# Patient Record
Sex: Female | Born: 1955 | Race: White | Hispanic: No | Marital: Married | Smoking: Former smoker
Health system: Southern US, Community
[De-identification: ages and names within clinical notes are randomized; demographics above are authoritative.]

## PROBLEM LIST (undated history)

## (undated) DIAGNOSIS — C801 Malignant (primary) neoplasm, unspecified: Secondary | ICD-10-CM

## (undated) DIAGNOSIS — K760 Fatty (change of) liver, not elsewhere classified: Secondary | ICD-10-CM

## (undated) DIAGNOSIS — C50912 Malignant neoplasm of unspecified site of left female breast: Secondary | ICD-10-CM

## (undated) DIAGNOSIS — M84376A Stress fracture, unspecified foot, initial encounter for fracture: Secondary | ICD-10-CM

## (undated) DIAGNOSIS — R05 Cough: Secondary | ICD-10-CM

## (undated) DIAGNOSIS — C50911 Malignant neoplasm of unspecified site of right female breast: Principal | ICD-10-CM

## (undated) DIAGNOSIS — I10 Essential (primary) hypertension: Secondary | ICD-10-CM

## (undated) DIAGNOSIS — M19079 Primary osteoarthritis, unspecified ankle and foot: Secondary | ICD-10-CM

## (undated) DIAGNOSIS — M609 Myositis, unspecified: Secondary | ICD-10-CM

## (undated) DIAGNOSIS — Z8739 Personal history of other diseases of the musculoskeletal system and connective tissue: Secondary | ICD-10-CM

## (undated) DIAGNOSIS — R059 Cough, unspecified: Secondary | ICD-10-CM

## (undated) DIAGNOSIS — Z95828 Presence of other vascular implants and grafts: Secondary | ICD-10-CM

## (undated) DIAGNOSIS — K259 Gastric ulcer, unspecified as acute or chronic, without hemorrhage or perforation: Secondary | ICD-10-CM

## (undated) DIAGNOSIS — IMO0001 Reserved for inherently not codable concepts without codable children: Secondary | ICD-10-CM

## (undated) DIAGNOSIS — C50919 Malignant neoplasm of unspecified site of unspecified female breast: Secondary | ICD-10-CM

## (undated) DIAGNOSIS — E785 Hyperlipidemia, unspecified: Secondary | ICD-10-CM

## (undated) HISTORY — DX: Stress fracture, unspecified foot, initial encounter for fracture: M84.376A

## (undated) HISTORY — DX: Primary osteoarthritis, unspecified ankle and foot: M19.079

## (undated) HISTORY — DX: Personal history of other diseases of the musculoskeletal system and connective tissue: Z87.39

## (undated) HISTORY — DX: Malignant neoplasm of unspecified site of left female breast: C50.912

## (undated) HISTORY — DX: Gastric ulcer, unspecified as acute or chronic, without hemorrhage or perforation: K25.9

## (undated) HISTORY — DX: Myositis, unspecified: M60.9

## (undated) HISTORY — DX: Fatty (change of) liver, not elsewhere classified: K76.0

## (undated) HISTORY — DX: Malignant neoplasm of unspecified site of unspecified female breast: C50.919

## (undated) HISTORY — DX: Malignant neoplasm of unspecified site of right female breast: C50.911

## (undated) HISTORY — DX: Presence of other vascular implants and grafts: Z95.828

---

## 1978-02-12 HISTORY — PX: TUBAL LIGATION: SHX77

## 2001-02-22 ENCOUNTER — Emergency Department (HOSPITAL_COMMUNITY): Admission: EM | Admit: 2001-02-22 | Discharge: 2001-02-22 | Payer: Self-pay | Admitting: Emergency Medicine

## 2001-02-23 ENCOUNTER — Encounter: Payer: Self-pay | Admitting: Emergency Medicine

## 2001-02-23 ENCOUNTER — Emergency Department (HOSPITAL_COMMUNITY): Admission: EM | Admit: 2001-02-23 | Discharge: 2001-02-23 | Payer: Self-pay | Admitting: *Deleted

## 2008-09-28 ENCOUNTER — Inpatient Hospital Stay (HOSPITAL_COMMUNITY): Admission: EM | Admit: 2008-09-28 | Discharge: 2008-09-30 | Payer: Self-pay | Admitting: Emergency Medicine

## 2008-09-28 ENCOUNTER — Ambulatory Visit: Payer: Self-pay | Admitting: Cardiology

## 2008-09-29 ENCOUNTER — Encounter (INDEPENDENT_AMBULATORY_CARE_PROVIDER_SITE_OTHER): Payer: Self-pay | Admitting: Internal Medicine

## 2010-05-20 LAB — DIFFERENTIAL
Basophils Absolute: 0 10*3/uL (ref 0.0–0.1)
Basophils Absolute: 0.1 10*3/uL (ref 0.0–0.1)
Basophils Relative: 1 % (ref 0–1)
Basophils Relative: 1 % (ref 0–1)
Eosinophils Absolute: 0.1 10*3/uL (ref 0.0–0.7)
Eosinophils Relative: 1 % (ref 0–5)
Lymphocytes Relative: 23 % (ref 12–46)
Lymphs Abs: 1.3 10*3/uL (ref 0.7–4.0)
Monocytes Absolute: 0.5 10*3/uL (ref 0.1–1.0)
Monocytes Relative: 10 % (ref 3–12)
Monocytes Relative: 8 % (ref 3–12)
Neutro Abs: 3.3 10*3/uL (ref 1.7–7.7)
Neutro Abs: 3.8 10*3/uL (ref 1.7–7.7)
Neutrophils Relative %: 58 % (ref 43–77)
Neutrophils Relative %: 66 % (ref 43–77)

## 2010-05-20 LAB — POCT CARDIAC MARKERS: Troponin i, poc: <0.05 ng/mL (ref 0.00–0.09)

## 2010-05-20 LAB — BASIC METABOLIC PANEL
BUN: 10 mg/dL (ref 6–23)
BUN: 17 mg/dL (ref 6–23)
BUN: 9 mg/dL (ref 6–23)
CO2: 24 mEq/L (ref 19–32)
CO2: 25 mEq/L (ref 19–32)
Calcium: 8.9 mg/dL (ref 8.4–10.5)
Calcium: 9 mg/dL (ref 8.4–10.5)
Calcium: 9.1 mg/dL (ref 8.4–10.5)
Chloride: 106 mEq/L (ref 96–112)
Creatinine, Ser: 0.65 mg/dL (ref 0.4–1.2)
Creatinine, Ser: 0.74 mg/dL (ref 0.4–1.2)
GFR calc Af Amer: >60 mL/min (ref >60)
GFR calc Af Amer: >60 mL/min (ref >60)
GFR calc non Af Amer: >60 mL/min (ref >60)
GFR calc non Af Amer: >60 mL/min (ref >60)
Glucose, Bld: 109 mg/dL — ABNORMAL HIGH (ref 70–99)
Glucose, Bld: 85 mg/dL (ref 70–99)
Potassium: 3.4 mEq/L — ABNORMAL LOW (ref 3.5–5.1)
Sodium: 139 mEq/L (ref 135–145)

## 2010-05-20 LAB — CARDIAC PANEL(CRET KIN+CKTOT+MB+TROPI)
CK, MB: 1.2 ng/mL (ref 0.3–4.0)
CK, MB: 1.6 ng/mL (ref 0.3–4.0)
Relative Index: INVALID (ref 0.0–2.5)
Relative Index: INVALID (ref 0.0–2.5)
Total CK: 74 U/L (ref 7–177)
Total CK: 94 U/L (ref 7–177)
Troponin I: 0.02 ng/mL (ref 0.00–0.06)

## 2010-05-20 LAB — CBC
HCT: 39.5 % (ref 36.0–46.0)
Hemoglobin: 13.9 g/dL (ref 12.0–15.0)
MCHC: 34.8 g/dL (ref 30.0–36.0)
MCHC: 35.3 g/dL (ref 30.0–36.0)
MCV: 97.6 fL (ref 78.0–100.0)
Platelets: 160 10*3/uL (ref 150–400)
Platelets: 172 10*3/uL (ref 150–400)
Platelets: 188 10*3/uL (ref 150–400)
RBC: 3.62 MIL/uL — ABNORMAL LOW (ref 3.87–5.11)
RBC: 4.05 MIL/uL (ref 3.87–5.11)
RDW: 13.2 % (ref 11.5–15.5)
RDW: 13.4 % (ref 11.5–15.5)
WBC: 5.7 10*3/uL (ref 4.0–10.5)

## 2010-05-20 LAB — LIPID PANEL
Cholesterol: 217 mg/dL — ABNORMAL HIGH (ref 0–200)
HDL: 62 mg/dL (ref >39)
LDL Cholesterol: 134 mg/dL — ABNORMAL HIGH (ref 0–99)

## 2010-05-20 LAB — RAPID URINE DRUG SCREEN, HOSP PERFORMED
Amphetamines: NOT DETECTED
Benzodiazepines: NOT DETECTED

## 2010-05-20 LAB — TSH: TSH: 2.801 u[IU]/mL (ref 0.350–4.500)

## 2010-05-20 LAB — PHOSPHORUS
Phosphorus: 3.1 mg/dL (ref 2.3–4.6)
Phosphorus: 3.1 mg/dL (ref 2.3–4.6)

## 2010-05-20 LAB — MAGNESIUM: Magnesium: 1.9 mg/dL (ref 1.5–2.5)

## 2010-06-27 NOTE — H&P (Signed)
NAMECLEONE, HULICK                  ACCOUNT NO.:  [id number]   MEDICAL RECORD NO.:  [id number]          PATIENT TYPE:  INP   LOCATION:  IC04                          FACILITY:  [facility]   PHYSICIAN:  Thad Ranger, MD       DATE OF BIRTH:  [birth year]   DATE OF ADMISSION:  09/28/2008  DATE OF DISCHARGE:  LH                              HISTORY & PHYSICAL   PRIMARY CARE PHYSICIAN:  Madelin Rear. Sherwood Gambler, MD   CHIEF COMPLAINT:  Chest pain.   HISTORY OF PRESENT ILLNESS:  Ms. Rollyson is a 55 year old female with known  history of hypertension and nicotine abuse, was brought to [facility]  Emergency Room with chief complaint of chest discomfort.  History was  obtained from the patient.  Per the patient, she has been having  intermittent episodes of chest pain, midsternal in the last 1 week.  The  chest pain did not radiate and she did not have any associated symptoms  of nausea, vomiting, diaphoresis, or any shortness of breath.  Today,  the patient went to her primary care physician's office and developed  severe chest pressure, which she described as 7/10 in intensity,  midsternal, associated with dyspnea and palpitations, but no other  radiation or any numbness in her arm or jaw.  She was given aspirin and  nitro sublingually and the chest pain resolved.  The blood pressure in  her PCP's office was noted to be 220/120 and the patient was hence  brought to [facility] ER for further evaluation.  Per the patient, she  has a known history of hypertension; however, her prescriptions ran out  3 months ago in May 2010 and since then she has not filled the  prescriptions.  Otherwise, she denies any abdominal pain, any nausea,  vomiting, diarrhea, any hematochezia, or melena.  She did have some  headache in the morning, which however has resolved at this point.  Otherwise, she denies any blurry vision, any hearing deficits, or recent  loss or gain of weight.   REVIEW OF SYSTEMS:  Ten-point review of  system is negative except  otherwise dictated in the above HPI.   PAST MEDICAL HISTORY:  Hypertension.   PAST SURGICAL HISTORY:  Tubal ligation.   SOCIAL HISTORY:  The patient was a heavy smoker and smoked 2 packs per  day for the last 20 years; however, 3 months ago, she cut down to 1/2  pack per day.  She drinks alcohol 2-3 drinks every night for last 3-5  years.  Otherwise, she denies any drug use and lives at home with her  family.   FAMILY HISTORY:  Positive for diabetes mellitus.  Her son also had an MI  at the age of 25 years.   ALLERGIES:  The patient is allergic to [facility], which gives her  palpitations.   MEDICATIONS PRIOR TO ADMISSION:  The patient has not taken any of her  blood pressure medications for last 4 months and does not remember the  name of her medications.   PHYSICAL EXAMINATION:  VITAL SIGNS:  Blood  pressure 190/103, pulse 84,  respiratory rate is 20, and O2 sat is 96%.  GENERAL:  The patient is alert, awake, and oriented x3; not in any acute  distress.  HEENT:  Anicteric sclerae, pink conjunctivae.  Pupils are reactive to  light and accommodation.  EOMI.  NECK:  Supple.  No lymphadenopathy.  No JVD.  No thyromegaly.  CVS:  S1 and S2 clear.  Regular rate and rhythm.  No murmur, rubs, or  gallops.  CHEST:  Clear to auscultation bilaterally.  No wheezing, rales, or  rhonchi.  ABDOMEN:  Nontender, nondistended.  Normal bowel sounds.  No  hepatosplenomegaly.  EXTREMITIES:  No cyanosis, clubbing, or edema noted in upper or lower  extremities.  NEURO:  Cranial nerves II-XII intact.  No focal neurological deficits  noted.  Motor strength 5/5 in upper and lower extremities.  SKIN:  No rashes or any decubitus noted.  GU:  No Foley or any CV angle tenderness noted.   LABORATORY DIAGNOSTIC DATA:  Sodium 139, potassium 3.4, BUN and  creatinine 17 and 0.7, and troponin less than 0.05.  White count 5.6,  hemoglobin 13.9, hematocrit 39.5, and platelets 188.   Chest x-ray was  done in the ER, which showed cardiomegaly, but no acute lung disease.   IMPRESSION AND PLAN:  Ms. Alderete is a 55 year old female with known  history of hypertension, was brought to [facility] Emergency Room with  complaints of chest pain associated with blood pressure of 220/120 in  her primary care physician's office.  1. Hypertensive emergency with acute chest pain.  Currently, the chest      pain has resolved.  However, blood pressure at the time of my      examination is 190/103.  The patient will be admitted to ICU and      will be started on nitro drip, which will be titrated per the      protocol.  She will be on tele monitor.  I will also obtain cardiac      enzymes for serial monitoring to rule out MI.  I will also obtain      EKG, TSH, BNP, and lipid profile.  The patient will be started on      beta-blocker and ACE inhibitor, and nitro drip will be weaned off      to the goal of her blood pressure less than 140/90.  I will also      obtain urine drug screen to rule out any exogenous drugs in her      system.  I will also place the patient on aspirin for cardio- and      renoprotective action.  Echocardiogram will be obtained to assess      the left ventricular function and to rule out any wall motion      abnormalities.  2. Nicotine and alcohol abuse.  The patient was counseled for smoking      and alcohol cessation.  3. Prophylaxis.  The patient will be placed on Lovenox for DVT      prophylaxis.  4. Code status, full code.      Thad Ranger, MD  Electronically Signed     RR/MEDQ  D:  09/28/2008  T:  09/29/2008  Job:  [id number]   cc:   Madelin Rear. Sherwood Gambler, MD  Fax: [phone]

## 2010-06-27 NOTE — Discharge Summary (Signed)
NAMEJOSCELYN, [name]                  ACCOUNT NO.:  [id number]   MEDICAL RECORD NO.:  [id number]          PATIENT TYPE:  INP   LOCATION:  A337                          FACILITY:  [facility]   PHYSICIAN:  Melissa L. Ladona Ridgel, MD  DATE OF BIRTH:  March 05, 1955   DATE OF ADMISSION:  09/29/2008  DATE OF DISCHARGE:  08/19/2010LH                               DISCHARGE SUMMARY   CHIEF COMPLAINT ON ADMISSION:  Chest pain.   DISCHARGE DIAGNOSES:  1. Hypertensive emergency with associated chest pain.  The patient was      seen in the emergency room after developing the onset of chest      pressure, 7/10 midsternal, associated was some sensation of dyspnea      and palpitations.  The symptoms were improved with nitroglycerin      but resolved with blood pressure control.  The patient has known      hypertension and had been not taking her medications for some time.      The patient was admitted to the intensive care unit and ruled out      on serial cardiac markers for acute myocardial infarction.  She had      no further chest pain or discomfort during the hospital stay.  The      patient underwent basic cholesterol screening and had an echo,      which reveals slight diastolic dysfunction grade 1, mild left      ventricular hypertrophy.  Ejection fraction is normal at 60-65%.      At this time recommendations are for continuing our current      medications, which include lisinopril, which will be up-titrated to      20 mg, metoprolol at 25 mg twice daily, and I have added Norvasc 5      mg daily as over the course of the evening her blood pressures did      try to creep up again.  The patient reported that with clonidine      she became very groggy, and therefore I did not discharge her on      clonidine at this time.  The patient will be discharged to home on      aspirin therapy, low-dose, and I have recommended a 7-day follow-up      with the free clinic and consideration for formalized cardiology   consultation once she has been seen and evaluated.  2. Mild elevations in her LDL cholesterol.  We discussed dietary      changes that she could make in order to impact upon them.  3. Tobacco abuse.  The patient has been counseled on tobacco      cessation, and she has the availability to enroll in classes as an      outpatient.  4. The patient does use daily alcohol.  She has been counseled with      regard to this.  At this time she shows no signs or symptoms of      alcohol withdrawal.   MEDICATIONS AT THE TIME OF DISCHARGE:  1. Lisinopril  20 mg daily.  2. Metoprolol 25 mg twice daily.  3. Norvasc 5 mg daily.  4. Aspirin 81 mg daily.   HOSPITAL COURSE:  The patient is a pleasant 55 year old female with a  known past medical history for hypertension, who presented to the  emergency room from an outside primary care physician's office.  While  at the primary care physician's office, she developed chest pressure  described as central, nonradiating, but associated with mild dyspnea and  palpitations.  The patient's blood pressure was noted to be 220/110 in  the doctor's office and therefore was brought to the emergency room for  further evaluation.  The patient states that she ran out of her  medications approximately 3 months ago and had not filled any  prescriptions since that time.  The patient was admitted to the ICU for  treatment with IV nitroglycerin for blood pressure control.  She  tolerated this well.  By the morning of the next day of admission, her  blood pressures were systolically in the 120s.  She had no chest pain  and no headache.  She was weaned from the nitroglycerin drip and  maintained her blood pressures on the oral medications that were  prescribed.  On the day of discharge the patient's blood pressures were  noted to creep up slightly.  She therefore was given 1 dose of  clonidine.  The patient reported to me that she became excessively  sleepy after taking  the clonidine, which did not feel good to her.  Her  follow-up blood pressures after receiving the clonidine showed a blood  pressure of 148/83.  The patient has remained asymptomatic with regards  to chest pain or headache throughout the rest of the hospital course.  At this time she is requesting discharge to home to make follow-up  appointments as an outpatient.   On the day of discharge, the patient is up about.  Her gait is stable.  She is normocephalic, atraumatic.  Pupils equal, round and reactive to  light.  Extraocular muscles are intact.  She has anicteric sclerae.  No  extraocular lesions.  Mucous membranes are moist.  External ear exam  reveals no lesions or discharge.  External nasal examination reveals  septum midline without discharge.  Examination of the mouth revealed no  oral or lip lesions.  NECK:  Supple.  There is no JVD, no lymph nodes, no carotid bruits.  CHEST:  Decreased but clear to auscultation.  There are no rhonchi,  rales or wheezes.  CARDIOVASCULAR:  Regular rate and rhythm, positive S1, S2, no S3 or S4.  No murmurs, rubs or gallops.  She has no heave or thrill.  Her apical  impulse is nondisplaced.  ABDOMEN:  Obese, nontender, nondistended, with positive bowel sounds.  There is no hepatosplenomegaly, no guarding, no rebound.  I did not  appreciate any bruits.  EXTREMITIES:  She has 2+ radial pulses, 2+ dorsalis pedis pulses.  She  has no edema, clubbing or cyanosis.  PSYCHIATRIC:  Affect is appropriate.  Recent and remote memory are  intact.  Judgment and insight are intact.   PERTINENT LABORATORIES DURING THE COURSE OF THIS HOSPITAL STAY:  Phosphorus of 3.1.  Magnesium 1.9.  Sodium 139, potassium 3.8, chloride  110, CO2 24, BUN 10, creatinine 0.65 and a glucose of 99.  Her last CBC  was 5.8 with a hemoglobin of 12.4, hematocrit 35.6 and platelets of 160.  Her TSH reveals a level of 2.801, which is  well within normal limits.  Her cholesterol is 206,  her triglycerides are 104, HDL 62 and LDL is  123.   Urine drug screen revealed none detected.   DISPOSITION:  At this time the patient is deemed stable for discharge to  home to follow up with the free clinic in town and perhaps have a  referral for cardiology evaluation as an outpatient.  I would like her  to be seen within the next 7 days to evaluate the effectiveness of her  current blood pressure regime, and I have given her the criteria for  picking up the phone call.  I have told her that if her systolic blood  pressure is greater than 160 or the diastolic blood pressure greater  than 100, that she should pick the phone up and speak with her new  physician or return to the emergency room if she is symptomatic.   Her diet is to be a heart-healthy, low-salt, low cholesterol diet.   Disposition is to home.   Total time on this discharge is less than 30 minutes.      Melissa L. Ladona Ridgel, MD  Electronically Signed     MLT/MEDQ  D:  09/30/2008  T:  09/30/2008  Job:  [id number]   cc:   Madelin Rear. Sherwood Gambler, MD  Fax: [phone]   [facility]

## 2011-03-14 ENCOUNTER — Other Ambulatory Visit (HOSPITAL_COMMUNITY): Payer: Self-pay | Admitting: Physician Assistant

## 2011-03-14 DIAGNOSIS — Z1231 Encounter for screening mammogram for malignant neoplasm of breast: Secondary | ICD-10-CM

## 2011-03-22 ENCOUNTER — Ambulatory Visit (HOSPITAL_COMMUNITY)
Admission: RE | Admit: 2011-03-22 | Discharge: 2011-03-22 | Disposition: A | Discharge status: Home / Self Care | Payer: PRIVATE HEALTH INSURANCE | Source: Ambulatory Visit | Attending: Physician Assistant | Admitting: Physician Assistant

## 2011-03-22 DIAGNOSIS — Z1231 Encounter for screening mammogram for malignant neoplasm of breast: Secondary | ICD-10-CM

## 2011-03-30 ENCOUNTER — Other Ambulatory Visit: Payer: Self-pay | Admitting: Physician Assistant

## 2011-03-30 DIAGNOSIS — R928 Other abnormal and inconclusive findings on diagnostic imaging of breast: Secondary | ICD-10-CM

## 2011-04-04 ENCOUNTER — Other Ambulatory Visit: Payer: Self-pay | Admitting: Physician Assistant

## 2011-04-04 DIAGNOSIS — R928 Other abnormal and inconclusive findings on diagnostic imaging of breast: Secondary | ICD-10-CM

## 2011-04-16 ENCOUNTER — Other Ambulatory Visit (HOSPITAL_COMMUNITY): Payer: Self-pay | Admitting: Physician Assistant

## 2011-04-16 DIAGNOSIS — R928 Other abnormal and inconclusive findings on diagnostic imaging of breast: Secondary | ICD-10-CM

## 2011-04-18 ENCOUNTER — Other Ambulatory Visit (HOSPITAL_COMMUNITY): Payer: Self-pay

## 2011-04-18 ENCOUNTER — Other Ambulatory Visit (HOSPITAL_COMMUNITY): Payer: Self-pay | Admitting: Family Medicine

## 2011-04-18 ENCOUNTER — Encounter (HOSPITAL_COMMUNITY): Payer: Self-pay

## 2011-04-18 DIAGNOSIS — R928 Other abnormal and inconclusive findings on diagnostic imaging of breast: Secondary | ICD-10-CM

## 2011-05-02 ENCOUNTER — Other Ambulatory Visit (HOSPITAL_COMMUNITY): Payer: Self-pay | Admitting: Family Medicine

## 2011-05-02 ENCOUNTER — Ambulatory Visit (HOSPITAL_COMMUNITY)
Admission: RE | Admit: 2011-05-02 | Discharge: 2011-05-02 | Disposition: A | Discharge status: Home / Self Care | Payer: Medicaid Other | Source: Ambulatory Visit | Attending: Physician Assistant | Admitting: Physician Assistant

## 2011-05-02 ENCOUNTER — Ambulatory Visit (HOSPITAL_COMMUNITY)
Admission: RE | Admit: 2011-05-02 | Discharge: 2011-05-02 | Disposition: A | Discharge status: Home / Self Care | Payer: Medicaid Other | Source: Ambulatory Visit | Attending: Family Medicine | Admitting: Family Medicine

## 2011-05-02 DIAGNOSIS — R928 Other abnormal and inconclusive findings on diagnostic imaging of breast: Secondary | ICD-10-CM

## 2011-05-02 DIAGNOSIS — N63 Unspecified lump in unspecified breast: Secondary | ICD-10-CM | POA: Insufficient documentation

## 2011-05-16 ENCOUNTER — Other Ambulatory Visit (HOSPITAL_COMMUNITY): Payer: Self-pay | Admitting: Family Medicine

## 2011-05-16 ENCOUNTER — Ambulatory Visit (HOSPITAL_COMMUNITY)
Admission: RE | Admit: 2011-05-16 | Discharge: 2011-05-16 | Disposition: A | Discharge status: Home / Self Care | Payer: Medicaid Other | Source: Ambulatory Visit | Attending: Family Medicine | Admitting: Family Medicine

## 2011-05-16 DIAGNOSIS — R928 Other abnormal and inconclusive findings on diagnostic imaging of breast: Secondary | ICD-10-CM

## 2011-05-16 DIAGNOSIS — C50919 Malignant neoplasm of unspecified site of unspecified female breast: Secondary | ICD-10-CM | POA: Insufficient documentation

## 2011-05-16 NOTE — Progress Notes (Signed)
Patient tolerated well. D/c instructions for breat biopsy given. Patient to mammo.

## 2011-05-16 NOTE — Discharge Instructions (Signed)
Breast Biopsy WHY YOU NEED A BIOPSY Your caregiver has recommended that you have a breast tissue sample taken (biopsy). This is done to be certain that the lump or abnormality found in your breast is not cancerous (malignant). During a biopsy, a small piece of tissue is removed, so it can be examined under a microscope by a specialist (pathologist) who looks at tissues and cells and diagnoses abnormalities in them. Most lumps (tumors) or abnormalities, on or in the breast, are not cancerous (benign). However, biopsies are taken when your caregiver cannot be absolutely certain of what is wrong only from doing a physical exam, mammogram (breast X-ray), or other studies. A breast biopsy can tell you whether nothing more needs to be done, or you need more surgery or another type of treatment. A biopsy is done when there is:  Any undiagnosed breast mass.   Nipple abnormalities, dimpling, crusting, or ulcerations.   Calcium deposits (calcifications) or abnormalities seen on your mammogram, ultrasound, or MRI.   Suspicious changes in the breast (thickening, asymmetry) seen on mammogram.   Abnormal discharge from the nipple, especially blood.   Redness, swelling, and pain of the breast.  HOW A BIOPSY IS PERFORMED A biopsy is often performed on an outpatient basis (you go home the same day). This can be done in a hospital, clinic, or surgical center. Tissue samples (biopsies) are often done under local anesthesia (area is numbed). Sometimes general anesthetics are required, in which case you sleep through the procedure. Biopsies may remove the entire lump, a small piece of the lump, or a small sliver of tissue removed by needle. TYPES OF BREAST BIOPSY  Fine needle aspiration. A thin needle is placed through the skin, to the lump or cyst, and cells are removed.   Core needle biopsy. A large needle with a special tip is placed through the skin, to the abnormality, and a piece of tissue is removed.    Stereotactic biopsy. A core needle with a special X-ray is used, to direct the needle to the lump or abnormal area, which is difficult to feel or cannot be felt.   Vacuum-assisted biopsy. A hollow probe and a gentle vacuum remove a sample of tissue.   Ultrasound guided core needle biopsy. You lie on your stomach, with your breast through an opening, and a high frequency ultrasound helps guide the needle to the area of the abnormality.   Open biopsy. An incision is made in the breast, and a piece of the lump or the whole lump is removed.  LET YOUR CAREGIVER KNOW ABOUT:  Allergies.   Medicines taken, including herbs, eye drops, over-the-counter medicines, and creams.   Use of steroids (by mouth or creams).   Previous problems with anesthetics or Novocaine.   If you are taking aspirin or blood thinners.   Possibility of pregnancy, if this applies.   History of blood clots (thrombophlebitis).   History of bleeding or blood problems.   Previous surgery.   Other health problems.  RISKS AND COMPLICATIONS   Bleeding.   Infection.   Allergy to medicines.   Bruising and swelling of the breast.   Alteration in the shape of the breast.   Not finding the lump or abnormality.   Needing more surgery.  BEFORE THE PROCEDURE  You should arrive 60 minutes prior to your procedure or as directed.   Check-in at the admissions desk, to fill out necessary forms, if you are not preregistered.   There will be consent forms   to sign, prior to the procedure.   There is a waiting area for your family, while you are having your biopsy.   Try to have someone with you, to drive you home.   Do not smoke for 2 weeks before the surgery.   Let your caregiver know if you develop a cold or an infection.   Do not drink alcohol for at least 24 hours before surgery.   Wear a good support bra to the surgery.  AFTER THE PROCEDURE  After surgery, you will be taken to the recovery area, where a  nurse will watch and check your progress. Once you are awake, stable, and taking fluids well, if there are no other problems, you will be allowed to go home.   Ice packs applied to your operative site may help with discomfort and keep the swelling down.   You may resume normal diet and activities as directed. Avoid strenuous activities affecting the arm on the side of the biopsy, such as tennis, swimming, heavy lifting (more than 10 pounds) or pulling.   Bruising in the breast is normal following this procedure.   Wearing a support bra, even to bed, may be more comfortable. The bra will also help keep the dressing on.   Change dressings as directed.   Your doctor may apply a pressure dressing on your breast for 24 to 48 hours.   Only take over-the-counter or prescription medicines for pain, discomfort, or fever as directed by your caregiver.   Do not take aspirin, because it can cause bleeding.  HOME CARE INSTRUCTIONS   You may resume your usual diet.   Have someone drive you home after the surgery.   Do not do any exercise, driving, lifting or general activities without your caregiver's permission.   Take medicines and over-the-counter medicines, as ordered by your caregiver.   Keep your postoperative appointments as recommended.   Do not drink alcohol while taking pain medicine.  Finding out the results of your test Not all test results are available during your visit. If your test results are not back during the visit, make an appointment with your caregiver to find out the results. Do not assume everything is normal if you have not heard from your caregiver or the medical facility. It is important for you to follow up on all of your test results.  SEEK MEDICAL CARE IF:   You notice redness, swelling, or increasing pain in the wound.   You notice a bad smell coming from the wound or dressing.   You develop a rash.   You need stronger pain medicine.   You are having an  allergic reaction or problems with your medicines.  SEEK IMMEDIATE MEDICAL CARE IF:   You have difficulty breathing.   You have a fever.   There is increased bleeding (more than a small spot) from the wound.   Pus is coming from the wound.   The wound is breaking open.  Document Released: 01/29/2005 Document Revised: 01/18/2011 Document Reviewed: 12/17/2008 Exit[employer] Patient Information 2012 [employer]. 

## 2011-05-18 ENCOUNTER — Other Ambulatory Visit: Payer: Self-pay | Admitting: Family Medicine

## 2011-05-18 DIAGNOSIS — C50911 Malignant neoplasm of unspecified site of right female breast: Secondary | ICD-10-CM

## 2011-05-22 ENCOUNTER — Ambulatory Visit (HOSPITAL_COMMUNITY)
Admission: RE | Admit: 2011-05-22 | Discharge: 2011-05-22 | Disposition: A | Discharge status: Home / Self Care | Payer: Medicaid Other | Source: Ambulatory Visit | Attending: Family Medicine | Admitting: Family Medicine

## 2011-05-22 DIAGNOSIS — C50911 Malignant neoplasm of unspecified site of right female breast: Secondary | ICD-10-CM

## 2011-05-22 DIAGNOSIS — C50919 Malignant neoplasm of unspecified site of unspecified female breast: Secondary | ICD-10-CM | POA: Insufficient documentation

## 2011-05-22 LAB — CREATININE, SERUM
GFR calc Af Amer: 87 mL/min — ABNORMAL LOW (ref >90)
GFR calc non Af Amer: 75 mL/min — ABNORMAL LOW (ref >90)

## 2011-05-22 MED ORDER — GADOBENATE DIMEGLUMINE 529 MG/ML IV SOLN
15.0000 mL | Freq: Once | INTRAVENOUS | Status: AC | PRN
  Administered 2011-05-22: 15 mL via INTRAVENOUS

## 2011-05-24 ENCOUNTER — Other Ambulatory Visit (HOSPITAL_COMMUNITY): Payer: Self-pay | Admitting: Family Medicine

## 2011-05-24 DIAGNOSIS — R928 Other abnormal and inconclusive findings on diagnostic imaging of breast: Secondary | ICD-10-CM

## 2011-05-27 NOTE — H&P (Signed)
NTS SOAP Note  Vital Signs:  Vitals as of: 05/10/2011: Systolic 157: Diastolic 91: Heart Rate 73: Temp 96.18F: Height 87ft 4in: Weight 182Lbs 0 Ounces: OFC 0in: Respiratory Rate 0: O2 Saturation 0: Pain Level 0: BMI 31  BMI : 31.24 kg/m2  Subjective: This 56 Years 48 Months old Female presents for of right breast mass.  Patient noted the mass ~ 6months ago.  No change in size.  Non-tender. No skin change.  No nipple discharge.  No fever or chills.  No weight changes.  No fatigue.  No prior history.    Review of Symptoms:  Constitutional:unremarkable Head:unremarkable Eyes:unremarkable Nose/Mouth/Throat:unremarkable Cardiovascular:unremarkable Respiratory:cough Gastrointestinal:unremarkable Genitourinary:unremarkable arthralgia of the joints. Skin:unremarkable as per hPI Hematolgic/Lymphatic:unremarkable Allergic/Immunologic:unremarkable   Past Medical History:Obtained   Past Medical History  Pregnancy Gravida: 2 Pregnancy Para: 2 Surgical History: none Medical Problems: HTN Psychiatric History: none Allergies: Keflex Medications: lisinopril, metoprolol.   Social History:Obtained  Social History  Age: 56 Years 9 Months Alcohol: 3 glasses wine/ week Recreational drug(s): none   Smoking Status: Current every day smoker reviewed on 05/13/2011 Started Date: 02/13/1968 Packs per day: 1.00   Family History:Obtained   Family History  Is there a family history of:DM, CAD, HTN, sister with breast CA (pre-cancerous)    Objective Information: General:Well appearing, well nourished in no distress. Skin:no rash or prominent lesions Head:Atraumatic; no masses; no abnormalities Eyes:conjunctiva clear, EOM intact, PERRL Mouth:Mucous membranes moist, no mucosal lesions. Neck:Supple without lymphadenopathy.  Heart:RRR, no murmur Lungs:CTA bilaterally, no wheezes, rhonchi, rales.  Breathing  unlabored. palpable 2-3 cm mass in upper outer quadrant of Right breast.  Non-tender.  Not fixed.  No nipple or skin changes.  No lymphadenopathy noted. Abdomen:Soft, NT/ND, no HSM, no masses. Extremities:No deformities, clubbing, cyanosis, or edema.    Mammo/U/s:  results discussed with patient.  BiRADS 5.  3x2x2 cm mass.  Spiculated.   Assessment:  Diagnosis &amp; Procedure:    Plan: Breast mass.  Worrisome for cancer.  Options discussed with the patient.  Biopsy highly recommended.  Options discussed.  Patient wishes to proceed with radiographically guided core needle biospy.  Will schedule and have patient follow-up after.  Should path reveal a cancer, surgical options will be discussed with the patient at that time.  Patient Education:Alternative treatments to surgery were discussed with patient (and family).Risks and benefits  of procedure were fully explained to the patient (and family) who gave informed consent. Patient/family questions were addressed.  Follow-up:Pending Test Results    Follow Up - 05/27/2011  Patient Name: Danielle [name] Date of Birth: December 21, 1955  Vital Signs:  Insert Vitals as of: 05/24/2011: Systolic 183: Diastolic 101: Heart Rate 73: Temp 37.28C: Height 163CM: Weight 82.55KG: OFC 0CM: Respiratory Rate 0: O2 Saturation 0: Pain Level 0: BMI 31   BMI: 31.24 kg/m2  Subjective: This 56 Years 74 Months old Female presents for followup.  right breast mass. Patient has no  significant complaints.   Social History:   Social History  Age: 56 Years 9 Months Alcohol: 3 glasses wine/ week Recreational drug(s): none    Smoking Status: Current every day smoker    Allergies:  Allergies Insert Code: No allergies found.     Objective: General: Well appearing, well nourished in no distress.      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   Assessment:   Diagnosis &amp; Procedure: DiagnosisCode:  174.9, ProcedureCode: 78295,    PO AOZ:HYQMVH  Plan:  right breast cancer. Surgical options were discussed with patient. Patient  will require axillary lymph node dissection based on biopsy findings. We will schedule patient for surgical intervention.  Follow-up:Pending Surgery                       Active Diagnosis and Procedures: 174.9 Malignant neoplasm of breast (female), unspecified   [id number] - OFFICE OUTPATIENT VISIT 5 MINUTES

## 2011-05-29 ENCOUNTER — Encounter (HOSPITAL_COMMUNITY): Payer: Self-pay | Admitting: Pharmacy Technician

## 2011-05-29 ENCOUNTER — Encounter (HOSPITAL_COMMUNITY): Payer: Self-pay

## 2011-05-29 ENCOUNTER — Encounter (HOSPITAL_COMMUNITY)
Admission: RE | Admit: 2011-05-29 | Discharge: 2011-05-29 | Disposition: A | Discharge status: Home / Self Care | Payer: Medicaid Other | Source: Ambulatory Visit | Attending: General Surgery | Admitting: General Surgery

## 2011-05-29 HISTORY — DX: Essential (primary) hypertension: I10

## 2011-05-29 LAB — BASIC METABOLIC PANEL
BUN: 30 mg/dL — ABNORMAL HIGH (ref 6–23)
CO2: 26 mEq/L (ref 19–32)
Calcium: 10.2 mg/dL (ref 8.4–10.5)
GFR calc non Af Amer: 43 mL/min — ABNORMAL LOW (ref >90)
Glucose, Bld: 112 mg/dL — ABNORMAL HIGH (ref 70–99)

## 2011-05-29 LAB — SURGICAL PCR SCREEN: MRSA, PCR: NEGATIVE

## 2011-05-29 LAB — DIFFERENTIAL
Basophils Absolute: 0.1 10*3/uL (ref 0.0–0.1)
Lymphocytes Relative: 29 % (ref 12–46)
Monocytes Absolute: 0.6 10*3/uL (ref 0.1–1.0)
Neutro Abs: 4.5 10*3/uL (ref 1.7–7.7)

## 2011-05-29 LAB — CBC
Hemoglobin: 14.2 g/dL (ref 12.0–15.0)
MCH: 32.6 pg (ref 26.0–34.0)
MCHC: 33.8 g/dL (ref 30.0–36.0)
MCV: 96.3 fL (ref 78.0–100.0)

## 2011-05-29 NOTE — Progress Notes (Signed)
05/29/11 1411  OBSTRUCTIVE SLEEP APNEA  Have you ever been diagnosed with sleep apnea through a sleep study? No  Do you snore loudly (loud enough to be heard through closed doors)?  1  Do you often feel tired, fatigued, or sleepy during the daytime? 1  Has anyone observed you stop breathing during your sleep? 0  Do you have, or are you being treated for high blood pressure? 1  BMI more than 35 kg/m2? 0  Age over 57 years old? 1  Neck circumference greater than 40 cm/18 inches? 0  Gender: 0  Obstructive Sleep Apnea Score 4   Score 4 or greater  Updated health history    

## 2011-05-29 NOTE — Patient Instructions (Addendum)
[name]  05/29/2011   Your procedure is scheduled on:  Monday, April 22  Report to [facility] at 1000 AM.  Call this number if you have problems the morning of surgery: [phone]   Remember:   Do not eat food:After Midnight.  May have clear liquids:until Midnight .  Clear liquids include soda, tea, black coffee, apple or grape juice, broth.  Take these medicines the morning of surgery with A SIP OF WATER: Lisinopril, metoprolol   Do not wear jewelry, make-up or nail polish.  Do not wear lotions, powders, or perfumes. You may wear deodorant.  Do not shave 48 hours prior to surgery.  Do not bring valuables to the hospital.  Contacts, dentures or bridgework may not be worn into surgery.  Leave suitcase in the car. After surgery it may be brought to your room.  For patients admitted to the hospital, checkout time is 11:00 AM the day of discharge.   Patients discharged the day of surgery will not be allowed to drive home.  Name and phone number of your driver: Family  Special Instructions: CHG Shower Use Special Wash: 1/2 bottle night before surgery and 1/2 bottle morning of surgery.   Please read over the following fact sheets that you were given: Pain Booklet, Coughing and Deep Breathing, MRSA Information, Surgical Site Infection Prevention, Anesthesia Post-op Instructions and Care and Recovery After Surgery PATIENT INSTRUCTIONS POST-ANESTHESIA  IMMEDIATELY FOLLOWING SURGERY:  Do not drive or operate machinery for the first twenty four hours after surgery.  Do not make any important decisions for twenty four hours after surgery or while taking narcotic pain medications or sedatives.  If you develop intractable nausea and vomiting or a severe headache please notify your doctor immediately.  FOLLOW-UP:  Please make an appointment with your surgeon as instructed. You do not need to follow up with anesthesia unless specifically instructed to do so.  WOUND CARE INSTRUCTIONS (if  applicable):  Keep a dry clean dressing on the anesthesia/puncture wound site if there is drainage.  Once the wound has quit draining you may leave it open to air.  Generally you should leave the bandage intact for twenty four hours unless there is drainage.  If the epidural site drains for more than 36-48 hours please call the anesthesia department.  QUESTIONS?:  Please feel free to call your physician or the hospital operator if you have any questions, and they will be happy to assist you.     [facility] Anesthesia Department [address] [place] [phone]  Breast Biopsy WHY YOU NEED A BIOPSY Your caregiver has recommended that you have a breast tissue sample taken (biopsy). This is done to be certain that the lump or abnormality found in your breast is not cancerous (malignant). During a biopsy, a small piece of tissue is removed, so it can be examined under a microscope by a specialist (pathologist) who looks at tissues and cells and diagnoses abnormalities in them. Most lumps (tumors) or abnormalities, on or in the breast, are not cancerous (benign). However, biopsies are taken when your caregiver cannot be absolutely certain of what is wrong only from doing a physical exam, mammogram (breast X-ray), or other studies. A breast biopsy can tell you whether nothing more needs to be done, or you need more surgery or another type of treatment. A biopsy is done when there is:  Any undiagnosed breast mass.   Nipple abnormalities, dimpling, crusting, or ulcerations.   Calcium deposits (calcifications)  or abnormalities seen on your mammogram, ultrasound, or MRI.   Suspicious changes in the breast (thickening, asymmetry) seen on mammogram.   Abnormal discharge from the nipple, especially blood.   Redness, swelling, and pain of the breast.  HOW A BIOPSY IS PERFORMED A biopsy is often performed on an outpatient basis (you go home the same day). This can be done in a hospital,  clinic, or surgical center. Tissue samples (biopsies) are often done under local anesthesia (area is numbed). Sometimes general anesthetics are required, in which case you sleep through the procedure. Biopsies may remove the entire lump, a small piece of the lump, or a small sliver of tissue removed by needle. TYPES OF BREAST BIOPSY  Fine needle aspiration. A thin needle is placed through the skin, to the lump or cyst, and cells are removed.   Core needle biopsy. A large needle with a special tip is placed through the skin, to the abnormality, and a piece of tissue is removed.   Stereotactic biopsy. A core needle with a special X-ray is used, to direct the needle to the lump or abnormal area, which is difficult to feel or cannot be felt.   Vacuum-assisted biopsy. A hollow probe and a gentle vacuum remove a sample of tissue.   Ultrasound guided core needle biopsy. You lie on your stomach, with your breast through an opening, and a high frequency ultrasound helps guide the needle to the area of the abnormality.   Open biopsy. An incision is made in the breast, and a piece of the lump or the whole lump is removed.  LET YOUR CAREGIVER KNOW ABOUT:  Allergies.   Medicines taken, including herbs, eye drops, over-the-counter medicines, and creams.   Use of steroids (by mouth or creams).   Previous problems with anesthetics or Novocaine.   If you are taking aspirin or blood thinners.   Possibility of pregnancy, if this applies.   History of blood clots (thrombophlebitis).   History of bleeding or blood problems.   Previous surgery.   Other health problems.  RISKS AND COMPLICATIONS   Bleeding.   Infection.   Allergy to medicines.   Bruising and swelling of the breast.   Alteration in the shape of the breast.   Not finding the lump or abnormality.   Needing more surgery.  BEFORE THE PROCEDURE  You should arrive 60 minutes prior to your procedure or as directed.   Check-in  at the admissions desk, to fill out necessary forms, if you are not preregistered.   There will be consent forms to sign, prior to the procedure.   There is a waiting area for your family, while you are having your biopsy.   Try to have someone with you, to drive you home.   Do not smoke for 2 weeks before the surgery.   Let your caregiver know if you develop a cold or an infection.   Do not drink alcohol for at least 24 hours before surgery.   Wear a good support bra to the surgery.  AFTER THE PROCEDURE  After surgery, you will be taken to the recovery area, where a nurse will watch and check your progress. Once you are awake, stable, and taking fluids well, if there are no other problems, you will be allowed to go home.   Ice packs applied to your operative site may help with discomfort and keep the swelling down.   You may resume normal diet and activities as directed. Avoid strenuous activities  affecting the arm on the side of the biopsy, such as tennis, swimming, heavy lifting (more than 10 pounds) or pulling.   Bruising in the breast is normal following this procedure.   Wearing a support bra, even to bed, may be more comfortable. The bra will also help keep the dressing on.   Change dressings as directed.   Your doctor may apply a pressure dressing on your breast for 24 to 48 hours.   Only take over-the-counter or prescription medicines for pain, discomfort, or fever as directed by your caregiver.   Do not take aspirin, because it can cause bleeding.  HOME CARE INSTRUCTIONS   You may resume your usual diet.   Have someone drive you home after the surgery.   Do not do any exercise, driving, lifting or general activities without your caregiver's permission.   Take medicines and over-the-counter medicines, as ordered by your caregiver.   Keep your postoperative appointments as recommended.   Do not drink alcohol while taking pain medicine.  Finding out the results  of your test Not all test results are available during your visit. If your test results are not back during the visit, make an appointment with your caregiver to find out the results. Do not assume everything is normal if you have not heard from your caregiver or the medical facility. It is important for you to follow up on all of your test results.  SEEK MEDICAL CARE IF:   You notice redness, swelling, or increasing pain in the wound.   You notice a bad smell coming from the wound or dressing.   You develop a rash.   You need stronger pain medicine.   You are having an allergic reaction or problems with your medicines.  SEEK IMMEDIATE MEDICAL CARE IF:   You have difficulty breathing.   You have a fever.   There is increased bleeding (more than a small spot) from the wound.   Pus is coming from the wound.   The wound is breaking open.  Document Released: 01/29/2005 Document Revised: 01/18/2011 Document Reviewed: 12/17/2008 [facility] Patient Information 2012 [place], [place].

## 2011-05-29 NOTE — Progress Notes (Signed)
05/29/11 1411  OBSTRUCTIVE SLEEP APNEA  Have you ever been diagnosed with sleep apnea through a sleep study? No  Do you snore loudly (loud enough to be heard through closed doors)?  1  Do you often feel tired, fatigued, or sleepy during the daytime? 1  Has anyone observed you stop breathing during your sleep? 0  Do you have, or are you being treated for high blood pressure? 1  BMI more than 35 kg/m2? 0  Age over 57 years old? 1  Neck circumference greater than 40 cm/18 inches? 0  Gender: 0  Obstructive Sleep Apnea Score 4   Score 4 or greater  Updated health history

## 2011-06-04 ENCOUNTER — Encounter (HOSPITAL_COMMUNITY)
Admission: RE | Disposition: A | Discharge status: Home / Self Care | Payer: Self-pay | Source: Ambulatory Visit | Attending: General Surgery

## 2011-06-04 ENCOUNTER — Ambulatory Visit (HOSPITAL_COMMUNITY)
Admission: RE | Admit: 2011-06-04 | Discharge: 2011-06-04 | Disposition: A | Discharge status: Home / Self Care | Payer: Medicaid Other | Source: Ambulatory Visit | Attending: General Surgery | Admitting: General Surgery

## 2011-06-04 ENCOUNTER — Encounter (HOSPITAL_COMMUNITY): Payer: Self-pay | Admitting: Anesthesiology

## 2011-06-04 ENCOUNTER — Ambulatory Visit (HOSPITAL_COMMUNITY): Payer: Medicaid Other | Admitting: Anesthesiology

## 2011-06-04 ENCOUNTER — Encounter (HOSPITAL_COMMUNITY): Payer: Self-pay

## 2011-06-04 DIAGNOSIS — C50919 Malignant neoplasm of unspecified site of unspecified female breast: Secondary | ICD-10-CM | POA: Insufficient documentation

## 2011-06-04 DIAGNOSIS — Z79899 Other long term (current) drug therapy: Secondary | ICD-10-CM | POA: Insufficient documentation

## 2011-06-04 DIAGNOSIS — C773 Secondary and unspecified malignant neoplasm of axilla and upper limb lymph nodes: Secondary | ICD-10-CM | POA: Insufficient documentation

## 2011-06-04 DIAGNOSIS — Z01810 Encounter for preprocedural cardiovascular examination: Secondary | ICD-10-CM | POA: Insufficient documentation

## 2011-06-04 DIAGNOSIS — Z01812 Encounter for preprocedural laboratory examination: Secondary | ICD-10-CM | POA: Insufficient documentation

## 2011-06-04 DIAGNOSIS — I10 Essential (primary) hypertension: Secondary | ICD-10-CM | POA: Insufficient documentation

## 2011-06-04 HISTORY — PX: MASTECTOMY, PARTIAL: SHX709

## 2011-06-04 HISTORY — PX: AXILLARY LYMPH NODE DISSECTION: SHX5229

## 2011-06-04 SURGERY — MASTECTOMY PARTIAL
Anesthesia: General | Laterality: Right | Wound class: Clean

## 2011-06-04 MED ORDER — MIDAZOLAM HCL 2 MG/2ML IJ SOLN
INTRAMUSCULAR | Status: AC
  Filled 2011-06-04: qty 2

## 2011-06-04 MED ORDER — ONDANSETRON HCL 4 MG/2ML IJ SOLN
4.0000 mg | Freq: Once | INTRAMUSCULAR | Status: AC | PRN
  Administered 2011-06-04: 4 mg via INTRAVENOUS

## 2011-06-04 MED ORDER — FENTANYL CITRATE 0.05 MG/ML IJ SOLN
25.0000 ug | INTRAMUSCULAR | Status: DC | PRN
  Administered 2011-06-04 (×3): 50 ug via INTRAVENOUS

## 2011-06-04 MED ORDER — 0.9 % SODIUM CHLORIDE (POUR BTL) OPTIME
TOPICAL | Status: DC | PRN
  Administered 2011-06-04: 1000 mL

## 2011-06-04 MED ORDER — CELECOXIB 100 MG PO CAPS
400.0000 mg | ORAL_CAPSULE | Freq: Every day | ORAL | Status: AC
  Administered 2011-06-04: 400 mg via ORAL

## 2011-06-04 MED ORDER — HYDROCODONE-ACETAMINOPHEN 5-325 MG PO TABS: 1.0000 | ORAL_TABLET | ORAL | Status: AC | PRN

## 2011-06-04 MED ORDER — LACTATED RINGERS IV SOLN
INTRAVENOUS | Status: DC
  Administered 2011-06-04 (×2): via INTRAVENOUS

## 2011-06-04 MED ORDER — PROPOFOL 10 MG/ML IV EMUL
INTRAVENOUS | Status: AC
  Filled 2011-06-04: qty 20

## 2011-06-04 MED ORDER — MIDAZOLAM HCL 2 MG/2ML IJ SOLN
1.0000 mg | INTRAMUSCULAR | Status: DC | PRN
  Administered 2011-06-04: 2 mg via INTRAVENOUS

## 2011-06-04 MED ORDER — FENTANYL CITRATE 0.05 MG/ML IJ SOLN
INTRAMUSCULAR | Status: DC | PRN
  Administered 2011-06-04: 25 ug via INTRAVENOUS
  Administered 2011-06-04 (×3): 50 ug via INTRAVENOUS
  Administered 2011-06-04: 25 ug via INTRAVENOUS

## 2011-06-04 MED ORDER — CELECOXIB 100 MG PO CAPS
ORAL_CAPSULE | ORAL | Status: AC
  Administered 2011-06-04: 400 mg via ORAL
  Filled 2011-06-04: qty 4

## 2011-06-04 MED ORDER — EPHEDRINE SULFATE 50 MG/ML IJ SOLN
INTRAMUSCULAR | Status: AC
  Filled 2011-06-04: qty 1

## 2011-06-04 MED ORDER — LIDOCAINE HCL (PF) 1 % IJ SOLN
INTRAMUSCULAR | Status: AC
  Filled 2011-06-04: qty 5

## 2011-06-04 MED ORDER — EPHEDRINE SULFATE 50 MG/ML IJ SOLN
INTRAMUSCULAR | Status: DC | PRN
  Administered 2011-06-04: 15 mg via INTRAVENOUS

## 2011-06-04 MED ORDER — MIDAZOLAM HCL 5 MG/5ML IJ SOLN
INTRAMUSCULAR | Status: DC | PRN
  Administered 2011-06-04 (×2): 2 mg via INTRAVENOUS

## 2011-06-04 MED ORDER — FENTANYL CITRATE 0.05 MG/ML IJ SOLN
INTRAMUSCULAR | Status: AC
  Administered 2011-06-04: 50 ug via INTRAVENOUS
  Filled 2011-06-04: qty 2

## 2011-06-04 MED ORDER — BUPIVACAINE HCL (PF) 0.5 % IJ SOLN
INTRAMUSCULAR | Status: DC | PRN
  Administered 2011-06-04: 10 mL

## 2011-06-04 MED ORDER — CIPROFLOXACIN IN D5W 400 MG/200ML IV SOLN
400.0000 mg | INTRAVENOUS | Status: AC
  Administered 2011-06-04: 400 mg via INTRAVENOUS

## 2011-06-04 MED ORDER — BUPIVACAINE HCL (PF) 0.5 % IJ SOLN
INTRAMUSCULAR | Status: AC
  Filled 2011-06-04: qty 30

## 2011-06-04 MED ORDER — MIDAZOLAM HCL 2 MG/2ML IJ SOLN
INTRAMUSCULAR | Status: AC
  Administered 2011-06-04: 2 mg via INTRAVENOUS
  Filled 2011-06-04: qty 2

## 2011-06-04 MED ORDER — ONDANSETRON HCL 4 MG/2ML IJ SOLN
INTRAMUSCULAR | Status: AC
  Administered 2011-06-04: 4 mg via INTRAVENOUS
  Filled 2011-06-04: qty 2

## 2011-06-04 MED ORDER — LIDOCAINE HCL (CARDIAC) 10 MG/ML IV SOLN
INTRAVENOUS | Status: DC | PRN
  Administered 2011-06-04: 20 mg via INTRAVENOUS

## 2011-06-04 MED ORDER — PROPOFOL 10 MG/ML IV EMUL
INTRAVENOUS | Status: DC | PRN
  Administered 2011-06-04: 50 mg via INTRAVENOUS
  Administered 2011-06-04: 20 mg via INTRAVENOUS
  Administered 2011-06-04: 150 mg via INTRAVENOUS

## 2011-06-04 MED ORDER — FENTANYL CITRATE 0.05 MG/ML IJ SOLN
INTRAMUSCULAR | Status: AC
  Filled 2011-06-04: qty 2

## 2011-06-04 MED ORDER — CIPROFLOXACIN IN D5W 400 MG/200ML IV SOLN
INTRAVENOUS | Status: AC
  Filled 2011-06-04: qty 200

## 2011-06-04 SURGICAL SUPPLY — 42 items
APL SKNCLS STERI-STRIP NONHPOA (GAUZE/BANDAGES/DRESSINGS) ×1
APPLIER CLIP 11 MED OPEN (CLIP) ×4
APR CLP MED 11 20 MLT OPN (CLIP) ×2
BAG HAMPER (MISCELLANEOUS) ×2 IMPLANT
BENZOIN TINCTURE PRP APPL 2/3 (GAUZE/BANDAGES/DRESSINGS) ×1 IMPLANT
BNDG CMPR 82X61 PLY HI ABS (GAUZE/BANDAGES/DRESSINGS) ×1
BNDG CONFORM 6X.82 1P STRL (GAUZE/BANDAGES/DRESSINGS) ×2 IMPLANT
CLIP APPLIE 11 MED OPEN (CLIP) IMPLANT
CLOTH BEACON ORANGE TIMEOUT ST (SAFETY) ×2 IMPLANT
COVER SURGICAL LIGHT HANDLE (MISCELLANEOUS) ×4 IMPLANT
DECANTER SPIKE VIAL GLASS SM (MISCELLANEOUS) ×2 IMPLANT
DRAPE PROXIMA HALF (DRAPES) ×1 IMPLANT
DURAPREP 26ML APPLICATOR (WOUND CARE) ×2 IMPLANT
ELECT REM PT RETURN 9FT ADLT (ELECTROSURGICAL) ×2
ELECTRODE REM PT RTRN 9FT ADLT (ELECTROSURGICAL) ×1 IMPLANT
GLOVE BIOGEL PI IND STRL 7.0 (GLOVE) IMPLANT
GLOVE BIOGEL PI IND STRL 7.5 (GLOVE) ×1 IMPLANT
GLOVE BIOGEL PI INDICATOR 7.0 (GLOVE) ×2
GLOVE BIOGEL PI INDICATOR 7.5 (GLOVE) ×1
GLOVE ECLIPSE 6.5 STRL STRAW (GLOVE) ×2 IMPLANT
GLOVE ECLIPSE 7.0 STRL STRAW (GLOVE) ×6 IMPLANT
GLOVE EXAM NITRILE PF MED BLUE (GLOVE) ×1 IMPLANT
GOWN STRL REIN XL XLG (GOWN DISPOSABLE) ×8 IMPLANT
KIT ROOM TURNOVER APOR (KITS) ×2 IMPLANT
MANIFOLD NEPTUNE II (INSTRUMENTS) ×2 IMPLANT
NDL HYPO 25X1 1.5 SAFETY (NEEDLE) ×1 IMPLANT
NEEDLE HYPO 25X1 1.5 SAFETY (NEEDLE) ×2 IMPLANT
NS IRRIG 1000ML POUR BTL (IV SOLUTION) ×2 IMPLANT
PACK MINOR (CUSTOM PROCEDURE TRAY) ×2 IMPLANT
PAD ARMBOARD 7.5X6 YLW CONV (MISCELLANEOUS) ×2 IMPLANT
SET BASIN LINEN APH (SET/KITS/TRAYS/PACK) ×2 IMPLANT
SPONGE LAP 18X18 X RAY DECT (DISPOSABLE) ×1 IMPLANT
STOCKINETTE IMPERVIOUS LG (DRAPES) ×2 IMPLANT
STRIP CLOSURE SKIN 1/2X4 (GAUZE/BANDAGES/DRESSINGS) ×2 IMPLANT
SUT MNCRL AB 4-0 PS2 18 (SUTURE) ×2 IMPLANT
SUT SILK 2 0 SH (SUTURE) ×1 IMPLANT
SUT VIC AB 3-0 SH 27 (SUTURE) ×4
SUT VIC AB 3-0 SH 27X BRD (SUTURE) ×1 IMPLANT
SWABSTICK BENZOIN STERILE (MISCELLANEOUS) ×2 IMPLANT
SYR BULB IRRIGATION 50ML (SYRINGE) ×2 IMPLANT
SYR CONTROL 10ML LL (SYRINGE) ×2 IMPLANT
TOWEL OR 17X26 4PK STRL BLUE (TOWEL DISPOSABLE) ×2 IMPLANT

## 2011-06-04 NOTE — Op Note (Signed)
Patient:  Danielle [name]  DOB:  August 29, 1955  MRN:  [id number]   Preop Diagnosis:  Right-sided breast cancer  Postop Diagnosis:  The same  Procedure:  Right partial mastectomy and axillary lymph node dissection  Surgeon:  Dr. Tilford Pillar  Anes:  General endotracheal, 0.5% Sensorcaine plain for local  Indications:  Patient is a 56 year old female presented to my office with a several month history of a mass in her breast. She had undergone radiographic workup including biopsy. Core needle biopsy of both the breast mass as well as a noted axillary lymph node were positive for carcinoma of the breast. Risks benefits alternatives of a partial versus simple mastectomy with axillary lymph node dissection were discussed at length the patient. Risk including but not limited to bleeding, infection, skin dehiscence, recurrence, additional surgical intervention, intraoperative cardiac and pulmonary events were discussed with the patient. Her questions and concerns were addressed. Patient was consented for the planned procedure.  Procedure note:  Patient was taken to the or was placed in a supine position on the OR table time the general anesthetic is administered. Once patient was asleep she was endotracheally needed by the nurse anesthetist. At this point her right arm neck and chest wall are prepped with DuraPrep solution and draped in standard fashion. A semilunar incision was created along the right upper portion of the breast with a 10 blade scalpel. Additional dissection down to subcuticular tissues including creation of the skin flaps medially and laterally were carried out using electrocautery. I carefully dissected around the palpable mass history there was a palpable rim of healthy tissue around the mass. This dissection was carried deep down to the pectoralis muscle and laterally to the axillary fat pad. Upon reaching the axilla I carefully dissected using a right angle clamp to elevate additional  axillary lymph nodes into the field. Several small blood vessels were ligated using surgical endoclips. This dissection was carried up superiorly to the axillary vein. Dissection was deep to the pectoralis muscular had. Medially dissection was carried out down along the chest wall taking care not to involve the long thoracic nerve. Posterior dissection was carried back to the border of the latissimus dorsi. At this point the specimen was free and in block. He was marked for proper orientation with a short suture superiorly and long suture laterally. The specimen was then sent as a permanent specimen to pathology.  Attention was now turned to closure and the wound is irrigated with copious amount of sterile saline. Hemostasis is excellent. At this time I reapproximate the deep subcuticular tissue and breast tissue with a running 3-0 Vicryl. The skin edges were then reapproximated using a 4-0 Monocryl in a running subcuticular suture. The skin was washed dried moist dry towel. Benzoin is applied around incision. Half-inch are suture placed. The drapes removed the patient left come out of general anesthetic and was transferred to the PACU in stable condition. At the conclusion of the procedure all instrument, sponge, needle counts are correct. Patient tolerated procedure extremely well.  Complications:  None apparent   EBL:  Less than 100 ML  Specimen:  Right breast mass and axillary lymph nodes en bloc.

## 2011-06-04 NOTE — Anesthesia Postprocedure Evaluation (Signed)
  Anesthesia Post-op Note  Patient: Danielle [name]  Procedure(s) Performed: Procedure(s) (LRB): MASTECTOMY PARTIAL (Right) AXILLARY LYMPH NODE DISSECTION (Right)  Patient Location: PACU  Anesthesia Type: General  Level of Consciousness: awake, alert , oriented and patient cooperative  Airway and Oxygen Therapy: Patient Spontanous Breathing  Post-op Pain: mild  Post-op Assessment: Post-op Vital signs reviewed, Patient's Cardiovascular Status Stable, Respiratory Function Stable, Patent Airway, No signs of Nausea or vomiting and Pain level controlled  Post-op Vital Signs: Reviewed and stable  Complications: No apparent anesthesia complications

## 2011-06-04 NOTE — Discharge Instructions (Signed)
General Anesthetic, Adult A doctor specialized in giving anesthesia (anesthesiologist) or a nurse specialized in giving anesthesia (nurse anesthetist) gives medicine that makes you sleep while a procedure is performed (general anesthetic). Once the general anesthetic has been administered, you will be in a sleeplike state in which you feel no pain. After having a general anestheticyou may feel:   Dizzy.   Weak.   Drowsy.   Confused.  These feelings are normal and can be expected to last for up to 24 hours after the procedure is completed.  LET YOUR CAREGIVER KNOW ABOUT:  Allergies you have.   Medications you are taking, including herbs, eye drops, over the counter medications, dietary supplements, and creams.   Previous problems you have had with anesthetics or numbing medicines.   Use of cigarettes, alcohol, or illicit drugs.   Possibility of pregnancy, if this applies.   History of bleeding or blood disorders, including blood clots and clotting disorders.   Previous surgeries you have had and types of anesthetics you have received.   Family medical history, especially anesthetic problems.   Other health problems.  BEFORE THE PROCEDURE  You may brush your teeth on the morning of surgery but you should have no solid food or non-clear liquids for a minimum of 8 hours prior to your procedure. Clear liquids (water, black coffee, and tea) are acceptable in small amounts until 2 hours prior to your procedure.   You may take your regular medications the morning of your procedure unless your caregiver indicates otherwise.  AFTER THE PROCEDURE  After surgery, you will be taken to the recovery area where a nurse will monitor your progress. You will be allowed to go home when you are awake, stable, taking fluids well, and without serious pain or complications.   For the first 24 hours following an anesthetic:   Have a responsible person with you.   Do not drive a car. If you are  alone, do not take public transportation.   Do not engage in strenuous activity. You may usually resume normal activities the next day, or as advised by your caregiver.   Do not drink alcohol.   Do not take medicine that has not been prescribed by your caregiver.   Do not sign important papers or make important decisions as your judgement may be impaired.   You may resume a normal diet as directed.   Change bandages (dressings) as directed.   Only take over-the-counter or prescription medicines for pain, discomfort, or fever as directed by your caregiver.  If you have questions or problems that seem related to the anesthetic, call the hospital and ask for the anesthetist, anesthesiologist, or anesthesia department. SEEK IMMEDIATE MEDICAL CARE IF:   You develop a rash.   You have difficulty breathing.   You have chest pain.   You have allergic problems.   You have uncontrolled nausea.   You have uncontrolled vomiting.   You develop any serious bleeding, especially from the incision site.  Document Released: 05/08/2007 Document Revised: 01/18/2011 Document Reviewed: 06/01/2010 [employer] Patient Information 2012 [employer]. 

## 2011-06-04 NOTE — Transfer of Care (Signed)
Immediate Anesthesia Transfer of Care Note  Patient: Danielle [name]  Procedure(s) Performed: Procedure(s) (LRB): MASTECTOMY PARTIAL (Right) AXILLARY LYMPH NODE DISSECTION (Right)  Patient Location: PACU  Anesthesia Type: General  Level of Consciousness: awake and patient cooperative  Airway & Oxygen Therapy: Patient Spontanous Breathing and Patient connected to face mask oxygen  Post-op Assessment: Report given to PACU RN, Post -op Vital signs reviewed and stable and Patient moving all extremities  Post vital signs: Reviewed and stable  Complications: No apparent anesthesia complications

## 2011-06-04 NOTE — Interval H&P Note (Signed)
History and Physical Interval Note:  06/04/2011 12:02 PM  Danielle [name]  has presented today for surgery, with the diagnosis of Breast CA   The various methods of treatment have been discussed with the patient and family. After consideration of risks, benefits and other options for treatment, the patient has consented to  Procedure(s) (LRB): MASTECTOMY PARTIAL (Right) as a surgical intervention .  The patients' history has been reviewed, patient examined, no change in status, stable for surgery.  I have reviewed the patients' chart and labs.  Questions were answered to the patient's satisfaction.     Moneka Mcquinn C

## 2011-06-04 NOTE — Anesthesia Procedure Notes (Signed)
Procedure Name: LMA Insertion Date/Time: 06/04/2011 12:30 PM Performed by: Franco Nones Pre-anesthesia Checklist: Patient identified, Patient being monitored, Emergency Drugs available, Timeout performed and Suction available Patient Re-evaluated:Patient Re-evaluated prior to inductionOxygen Delivery Method: Circle System Utilized Preoxygenation: Pre-oxygenation with 100% oxygen Intubation Type: IV induction Ventilation: Mask ventilation without difficulty LMA: LMA inserted LMA Size: 4.0 Number of attempts: 1 Placement Confirmation: positive ETCO2 and breath sounds checked- equal and bilateral

## 2011-06-04 NOTE — Anesthesia Preprocedure Evaluation (Signed)
Anesthesia Evaluation  Patient identified by MRN, date of birth, ID band Patient awake    Reviewed: Allergy & Precautions, H&P , NPO status , Patient's Chart, lab work & pertinent test results, reviewed documented beta blocker date and time   History of Anesthesia Complications Negative for: history of anesthetic complications  Airway Mallampati: II      Dental  (+) Edentulous Upper and Edentulous Lower   Pulmonary Current Smoker (am cough),  breath sounds clear to auscultation        Cardiovascular hypertension, Pt. on medications + dysrhythmias (tachycardia, lopressor) Rhythm:Regular Rate:Normal     Neuro/Psych    GI/Hepatic   Endo/Other    Renal/GU      Musculoskeletal   Abdominal   Peds  Hematology   Anesthesia Other Findings   Reproductive/Obstetrics                           Anesthesia Physical Anesthesia Plan  ASA: III  Anesthesia Plan: General   Post-op Pain Management:    Induction: Intravenous  Airway Management Planned: LMA  Additional Equipment:   Intra-op Plan:   Post-operative Plan: Extubation in OR  Informed Consent: I have reviewed the patients History and Physical, chart, labs and discussed the procedure including the risks, benefits and alternatives for the proposed anesthesia with the patient or authorized representative who has indicated his/her understanding and acceptance.     Plan Discussed with:   Anesthesia Plan Comments:         Anesthesia Quick Evaluation

## 2011-06-05 NOTE — Anesthesia Postprocedure Evaluation (Signed)
  Anesthesia Post-op Note  Patient: Danielle [name]  Procedure(s) Performed: Procedure(s) (LRB): MASTECTOMY PARTIAL (Right) AXILLARY LYMPH NODE DISSECTION (Right)  Patient Location: PACU  Anesthesia Type: General  Level of Consciousness: awake, alert , oriented and patient cooperative  Airway and Oxygen Therapy: Patient Spontanous Breathing  Post-op Pain: none  Post-op Assessment: Post-op Vital signs reviewed, Patient's Cardiovascular Status Stable, Respiratory Function Stable, Patent Airway, Adequate PO intake and Pain level controlled  Post-op Vital Signs: Reviewed and stable  Complications: No apparent anesthesia complications

## 2011-06-05 NOTE — Addendum Note (Signed)
Addendum  created 06/05/11 1212 by Despina Hidden, CRNA   Modules edited:Notes Section

## 2011-06-06 ENCOUNTER — Encounter (HOSPITAL_COMMUNITY): Payer: Self-pay | Admitting: General Surgery

## 2011-07-02 ENCOUNTER — Encounter (HOSPITAL_COMMUNITY): Payer: Self-pay | Admitting: Oncology

## 2011-07-02 ENCOUNTER — Encounter (HOSPITAL_COMMUNITY): Payer: Medicaid Other | Attending: Oncology | Admitting: Oncology

## 2011-07-02 VITALS — BP 148/82 | HR 82 | Temp 98.0°F | Ht 63.5 in | Wt 184.9 lb

## 2011-07-02 DIAGNOSIS — C773 Secondary and unspecified malignant neoplasm of axilla and upper limb lymph nodes: Secondary | ICD-10-CM | POA: Insufficient documentation

## 2011-07-02 DIAGNOSIS — Z170 Estrogen receptor positive status [ER+]: Secondary | ICD-10-CM

## 2011-07-02 DIAGNOSIS — F17200 Nicotine dependence, unspecified, uncomplicated: Secondary | ICD-10-CM | POA: Insufficient documentation

## 2011-07-02 DIAGNOSIS — J449 Chronic obstructive pulmonary disease, unspecified: Secondary | ICD-10-CM

## 2011-07-02 DIAGNOSIS — I10 Essential (primary) hypertension: Secondary | ICD-10-CM | POA: Insufficient documentation

## 2011-07-02 DIAGNOSIS — C50911 Malignant neoplasm of unspecified site of right female breast: Secondary | ICD-10-CM

## 2011-07-02 DIAGNOSIS — C50419 Malignant neoplasm of upper-outer quadrant of unspecified female breast: Secondary | ICD-10-CM

## 2011-07-02 DIAGNOSIS — J4489 Other specified chronic obstructive pulmonary disease: Secondary | ICD-10-CM | POA: Insufficient documentation

## 2011-07-02 DIAGNOSIS — C50919 Malignant neoplasm of unspecified site of unspecified female breast: Secondary | ICD-10-CM | POA: Insufficient documentation

## 2011-07-02 LAB — COMPREHENSIVE METABOLIC PANEL
ALT: 43 U/L — ABNORMAL HIGH (ref 0–35)
AST: 35 U/L (ref 0–37)
Albumin: 4 g/dL (ref 3.5–5.2)
CO2: 21 mEq/L (ref 19–32)
Calcium: 9.8 mg/dL (ref 8.4–10.5)
Chloride: 101 mEq/L (ref 96–112)
Creatinine, Ser: 0.97 mg/dL (ref 0.50–1.10)
Sodium: 134 mEq/L — ABNORMAL LOW (ref 135–145)
Total Bilirubin: 0.2 mg/dL — ABNORMAL LOW (ref 0.3–1.2)

## 2011-07-02 LAB — DIFFERENTIAL
Basophils Absolute: 0.1 10*3/uL (ref 0.0–0.1)
Basophils Relative: 1 % (ref 0–1)
Lymphocytes Relative: 30 % (ref 12–46)
Monocytes Absolute: 0.6 10*3/uL (ref 0.1–1.0)
Neutro Abs: 3.4 10*3/uL (ref 1.7–7.7)

## 2011-07-02 LAB — CBC
HCT: 42.2 % (ref 36.0–46.0)
MCHC: 34.1 g/dL (ref 30.0–36.0)
Platelets: 209 10*3/uL (ref 150–400)
RDW: 13.7 % (ref 11.5–15.5)
WBC: 5.9 10*3/uL (ref 4.0–10.5)

## 2011-07-02 NOTE — Progress Notes (Signed)
This office note has been dictated.

## 2011-07-02 NOTE — Progress Notes (Signed)
CC:   Jonette Eva, M.D. [facility] Tilford Pillar, MD  DIAGNOSES: 1. 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  She is ER positive, PR positive but     HER-2/neu negative and Ki-67 marker is high at 33% and she is     status post surgery by Dr. Leticia Penna on 06/04/2011. 2. Chronic obstructive pulmonary disease, still smoking about half a     pack to a pack of cigarettes a day and started at age 56. 3. 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  This is a very pleasant 56 year old woman who noticed a lump in her breast some time within the last 6 months, and she thought that it had actually grown.  She states that it was not there 1 day and there the next and she had done basically monthly breast examinations over the last 3-4 years.  She had a biopsy of both the breast mass and the lymph node, both of which were positive.  Her ER receptors were 89%, PR receptors 50%, HER- 2/neu was not amplified.  Ki-67 marker 33% and pathologically she was a pT2 pN1c.  LVI was clearly seen and there was extranodal extension.  Two of the 3 nodes had extracapsular extension.  She had some lab work drawn prior to surgery which showed a good CBC and differential, glucose minimally elevated at 112, creatinine 1.35 that was on 04/16 but prior to that on 04/09 it was 0.85 and I am not sure what the difference is there.  She has not had any staging scans.  She does have a sister who at age 34, now 5, was diagnosed with a very small breast cancer, took some radiation treatments only it sounds like and may be on pills but she  is not sure of that.  There is no other family history of breast cancer or ovarian cancer let alone other cancers.  Her mother died from strokes and MIs and her father died of an MI.  She has 3 sisters, and I think still 4 brothers.  She has 2 children of her own.  One daughter and one son and they are in good health, ages 5 for the son and 58 for the daughter.  She is married to an alcoholic who has stopped drinking, was separated for a while but when she was still with him she started drinking and got to a pint a day.  Now drinks only wine, she states 3 or 4 glasses a night to help her sleep since she cannot tolerate Xanax.  She states that really affects her mentally, primarily makes her very, very sleepy. She also states that Tylenol makes her sleepy but she drinks the wine at night to help her rest, she states.  She thinks that there will not be an issue in cutting back.  She has had her tubes tied, 2 pregnancies and that is the only surgery other than her teeth have  been removed and she has upper and lower dental plates.  She had not had a mammogram in many years she states due to loss of insurance.  She was accepted by the [facility] program, she states. That was done before her surgery it sounds like.  PHYSICAL EXAMINATION:  A very pleasant lady, looks her stated age, has chronic skin changes on her face of I think of a smoker and she also has a lot of tanning as she lies out in the sun a lot, she states.  She is 5 feet 3-1/2 inches tall.  Weight is 184 pounds.  BMI is 32.  Blood pressure 148/82 left arm sitting position.  Pulse 80 and regular. Respirations 16-18 and unlabored.  She is afebrile.  Her right breast though is very swollen.  It is mildly red especially in the upper outer quadrant, lower outer quadrant and part of the upper inner quadrant.  It is very firm and slightly taut and warm to the touch.  She has no obvious nodes in the axilla, cervical, supraclavicular,  infraclavicular or inguinal areas.  The left breast is negative for any masses.  Heart shows a regular rhythm and rate without murmur, rub or gallop.  Lungs are clear but with diminished breath sounds throughout.  She has no hepatosplenomegaly on her abdominal exam.  Bowel sounds are normal.  She has no leg edema.  No arm edema.  She is alert.  She is oriented.  She does have upper and lower dental plates.  Tongue is unremarkable. Throat is clear.  Pupils are equally round and reactive to light.  So I think Clorinda clearly needs some staging studies.  She just finished some Septra DS since she cannot tolerate Keflex.  She took it twice a day for 5 days.  I would like to renew that for 5 more days, get her back to see Dr. Leticia Penna and she already has an appointment on Thursday. She is also hypertensive and on some medication for that.  She is clearly a candidate for chemotherapy at her age.  She has lots of risk factors, including 3 positive nodes, lymphovascular space invasion, extranodal disease, grade 2 disease and a fairly sizable T2 tumor.  She needs staging studies because of the positive nodes, LVI, etc.  Will set her up for bone scan, CT chest, abdomen and pelvis with contrast, check her liver enzymes, repeat a CBC and differential today and get her to see radiation therapy.  She will need a port but I think her breast needs to absolute heal and calm down before we do this definitive therapy.  She clearly needs all 4 modalities of therapy, namely surgery, chemotherapy, radiation therapy and hormone therapy.  The latter will be after the conclusion of the radiation.    ______________________________ Ladona Horns. Mariel Sleet, MD ESN/MEDQ  D:  07/02/2011  T:  07/02/2011  Job:  [id number]

## 2011-07-02 NOTE — Progress Notes (Signed)
Danielle [name] presented for labwork. Labs per MD order drawn via Peripheral Line 23 gauge needle inserted in left antecubital.  Good blood return present. Procedure without incident.  Needle removed intact. Patient tolerated procedure well.   

## 2011-07-02 NOTE — Patient Instructions (Signed)
[facility]  Discharge Instructions  RECOMMENDATIONS MADE BY THE CONSULTANT AND ANY TEST RESULTS WILL BE SENT TO YOUR REFERRING DOCTOR.   You need to try to quit smoking.  You need to limit your alcohol consumption to 2 glasses of wine daily. Once you start chemotherapy you will need to abstain from using any alcohol the night prior to chemo and for a few days afterwards.  You will need a port-a-cath to deliver chemotherapy through. Dr.Zieglar can do this as an out-patient and we will contact him about this.  We have called in an additional 5 days of antibiotics, please pick these up and continue them until you see Dr.Zieglar.  We will get you scheduled for CT scans as well as a Bone Scan.  Once your breast has healed and your port-a-cath has been placed we will get you scheduled for chemotherapy.  We will contact you when these appointments have been made. You may call us with any questions at [phone].    I acknowledge that I have been informed and understand all the instructions given to me and received a copy. I do not have any more questions at this time, but understand that I may call the Specialty Clinic at [facility] at [phone] during business hours should I have any further questions or need assistance in obtaining follow-up care.    __________________________________________  _____________  __________ Signature of Patient or Authorized Representative            Date                   Time    __________________________________________ Nurse's Signature

## 2011-07-03 ENCOUNTER — Ambulatory Visit (HOSPITAL_COMMUNITY): Admission: RE | Admit: 2011-07-03 | Payer: PRIVATE HEALTH INSURANCE | Source: Ambulatory Visit

## 2011-07-03 ENCOUNTER — Other Ambulatory Visit (HOSPITAL_COMMUNITY): Payer: PRIVATE HEALTH INSURANCE

## 2011-07-03 ENCOUNTER — Other Ambulatory Visit (HOSPITAL_COMMUNITY): Payer: Self-pay | Admitting: Oncology

## 2011-07-03 ENCOUNTER — Ambulatory Visit (HOSPITAL_COMMUNITY)
Admission: RE | Admit: 2011-07-03 | Discharge: 2011-07-03 | Disposition: A | Discharge status: Home / Self Care | Payer: Medicaid Other | Source: Ambulatory Visit | Attending: Oncology | Admitting: Oncology

## 2011-07-03 DIAGNOSIS — R918 Other nonspecific abnormal finding of lung field: Secondary | ICD-10-CM | POA: Insufficient documentation

## 2011-07-03 DIAGNOSIS — C50919 Malignant neoplasm of unspecified site of unspecified female breast: Secondary | ICD-10-CM | POA: Insufficient documentation

## 2011-07-03 DIAGNOSIS — Y836 Removal of other organ (partial) (total) as the cause of abnormal reaction of the patient, or of later complication, without mention of misadventure at the time of the procedure: Secondary | ICD-10-CM | POA: Insufficient documentation

## 2011-07-03 DIAGNOSIS — C50911 Malignant neoplasm of unspecified site of right female breast: Secondary | ICD-10-CM

## 2011-07-03 DIAGNOSIS — IMO0002 Reserved for concepts with insufficient information to code with codable children: Secondary | ICD-10-CM | POA: Insufficient documentation

## 2011-07-03 MED ORDER — IOHEXOL 300 MG/ML  SOLN
100.0000 mL | Freq: Once | INTRAMUSCULAR | Status: AC | PRN
  Administered 2011-07-03: 100 mL via INTRAVENOUS

## 2011-07-04 ENCOUNTER — Encounter (HOSPITAL_COMMUNITY)
Admission: RE | Admit: 2011-07-04 | Discharge: 2011-07-04 | Disposition: A | Discharge status: Home / Self Care | Payer: Medicaid Other | Source: Ambulatory Visit | Attending: Oncology | Admitting: Oncology

## 2011-07-04 ENCOUNTER — Encounter (HOSPITAL_COMMUNITY): Payer: Self-pay

## 2011-07-04 DIAGNOSIS — C50911 Malignant neoplasm of unspecified site of right female breast: Secondary | ICD-10-CM

## 2011-07-04 DIAGNOSIS — C50919 Malignant neoplasm of unspecified site of unspecified female breast: Secondary | ICD-10-CM | POA: Insufficient documentation

## 2011-07-04 HISTORY — DX: Malignant (primary) neoplasm, unspecified: C80.1

## 2011-07-04 MED ORDER — TECHNETIUM TC 99M MEDRONATE IV KIT
25.0000 | PACK | Freq: Once | INTRAVENOUS | Status: AC | PRN
  Administered 2011-07-04: 23.5 via INTRAVENOUS

## 2011-07-17 ENCOUNTER — Encounter (HOSPITAL_COMMUNITY)
Admission: RE | Admit: 2011-07-17 | Discharge: 2011-07-17 | Disposition: A | Discharge status: Home / Self Care | Payer: Medicaid Other | Source: Ambulatory Visit | Attending: General Surgery | Admitting: General Surgery

## 2011-07-17 ENCOUNTER — Encounter (HOSPITAL_COMMUNITY): Payer: Self-pay

## 2011-07-17 HISTORY — DX: Hyperlipidemia, unspecified: E78.5

## 2011-07-17 LAB — SURGICAL PCR SCREEN
MRSA, PCR: NEGATIVE
Staphylococcus aureus: NEGATIVE

## 2011-07-17 NOTE — Patient Instructions (Addendum)
[name]  07/17/2011   Your procedure is scheduled on:  07/23/11  Report to [facility] at 08:10 AM.  Call this number if you have problems the morning of surgery: [phone]   Remember:   Do not eat food:After Midnight.  May have clear liquids:until Midnight .  Clear liquids include soda, tea, black coffee, apple or grape juice, broth.  Take these medicines the morning of surgery with A SIP OF WATER: metoprolol and lisinopril   Do not wear jewelry, make-up or nail polish.  Do not wear lotions, powders, or perfumes. You may wear deodorant.  Do not shave 48 hours prior to surgery. Men may shave face and neck.  Do not bring valuables to the hospital.  Contacts, dentures or bridgework may not be worn into surgery.  Leave suitcase in the car. After surgery it may be brought to your room.  For patients admitted to the hospital, checkout time is 11:00 AM the day of discharge.   Patients discharged the day of surgery will not be allowed to drive home.  Name and phone number of your driver: family  Special Instructions: CHG Shower Use Special Wash: 1/2 bottle night before surgery and 1/2 bottle morning of surgery.   Please read over the following fact sheets that you were given: Pain Booklet, MRSA Information, Surgical Site Infection Prevention, Anesthesia Post-op Instructions and Care and Recovery After Surgery   PATIENT INSTRUCTIONS POST-ANESTHESIA  IMMEDIATELY FOLLOWING SURGERY:  Do not drive or operate machinery for the first twenty four hours after surgery.  Do not make any important decisions for twenty four hours after surgery or while taking narcotic pain medications or sedatives.  If you develop intractable nausea and vomiting or a severe headache please notify your doctor immediately.  FOLLOW-UP:  Please make an appointment with your surgeon as instructed. You do not need to follow up with anesthesia unless specifically instructed to do so.  WOUND CARE INSTRUCTIONS (if  applicable):  Keep a dry clean dressing on the anesthesia/puncture wound site if there is drainage.  Once the wound has quit draining you may leave it open to air.  Generally you should leave the bandage intact for twenty four hours unless there is drainage.  If the epidural site drains for more than 36-48 hours please call the anesthesia department.  QUESTIONS?:  Please feel free to call your physician or the hospital operator if you have any questions, and they will be happy to assist you.

## 2011-07-17 NOTE — Progress Notes (Signed)
07/17/11 1259  OBSTRUCTIVE SLEEP APNEA  Have you ever been diagnosed with sleep apnea through a sleep study? No  Do you snore loudly (loud enough to be heard through closed doors)?  1  Do you often feel tired, fatigued, or sleepy during the daytime? 1  Has anyone observed you stop breathing during your sleep? 0  Do you have, or are you being treated for high blood pressure? 1  BMI more than 35 kg/m2? 0  Age over 57 years old? 1  Neck circumference greater than 40 cm/18 inches? 0  Gender: 0  Obstructive Sleep Apnea Score 4   Score 4 or greater  Updated health history;Results sent to PCP

## 2011-07-22 NOTE — H&P (Signed)
NTS SOAP Note  Vital Signs:  Vitals as of: 05/10/2011: Systolic 157: Diastolic 91: Heart Rate 73: Temp 96.56F: Height 35ft 4in: Weight 182Lbs 0 Ounces: OFC 0in: Respiratory Rate 0: O2 Saturation 0: Pain Level 0: BMI 31  BMI : 31.24 kg/m2  Subjective: This 56 Years 51 Months old Female presents for of right breast mass.  Patient noted the mass ~ 6months ago.  No change in size.  Non-tender. No skin change.  No nipple discharge.  No fever or chills.  No weight changes.  No fatigue.  No prior history.    Review of Symptoms:  Constitutional:unremarkable Head:unremarkable Eyes:unremarkable Nose/Mouth/Throat:unremarkable Cardiovascular:unremarkable Respiratory:cough Gastrointestinal:unremarkable Genitourinary:unremarkable arthralgia of the joints. Skin:unremarkable as per hPI Hematolgic/Lymphatic:unremarkable Allergic/Immunologic:unremarkable   Past Medical History:Obtained   Past Medical History  Pregnancy Gravida: 2 Pregnancy Para: 2 Surgical History: none Medical Problems: HTN Psychiatric History: none Allergies: Keflex Medications: lisinopril, metoprolol.   Social History:Obtained  Social History  Age: 56 Years 9 Months Alcohol: 3 glasses wine/ week Recreational drug(s): none   Smoking Status: Current every day smoker reviewed on 05/13/2011 Started Date: 02/13/1968 Packs per day: 1.00   Family History:Obtained   Family History  Is there a family history of:DM, CAD, HTN, sister with breast CA (pre-cancerous)    Objective Information: General:Well appearing, well nourished in no distress. Skin:no rash or prominent lesions Head:Atraumatic; no masses; no abnormalities Eyes:conjunctiva clear, EOM intact, PERRL Mouth:Mucous membranes moist, no mucosal lesions. Neck:Supple without lymphadenopathy.  Heart:RRR, no murmur Lungs:CTA bilaterally, no wheezes, rhonchi, rales.  Breathing  unlabored. palpable 2-3 cm mass in upper outer quadrant of Right breast.  Non-tender.  Not fixed.  No nipple or skin changes.  No lymphadenopathy noted. Abdomen:Soft, NT/ND, no HSM, no masses. Extremities:No deformities, clubbing, cyanosis, or edema.    Mammo/U/s:  results discussed with patient.  BiRADS 5.  3x2x2 cm mass.  Spiculated.   Assessment:  Diagnosis &amp; Procedure:    Plan: Breast mass.  Worrisome for cancer.  Options discussed with the patient.  Biopsy highly recommended.  Options discussed.  Patient wishes to proceed with radiographically guided core needle biospy.  Will schedule and have patient follow-up after.  Should path reveal a cancer, surgical options will be discussed with the patient at that time.  Patient Education:Alternative treatments to surgery were discussed with patient (and family).Risks and benefits  of procedure were fully explained to the patient (and family) who gave informed consent. Patient/family questions were addressed.  Follow-up:Pending Test Results                               Post Operative Follow Up - 07/12/2011  Patient Name: Danielle [name] Date of Birth: 1955-04-19  Vital Signs:  Insert Vitals as of: 07/12/2011: Systolic 161: Diastolic 90: Heart Rate 78: Temp 36C: Height 163CM: Weight 82.1KG: Pain Level 1: BMI 31   BMI: 31.07 kg/m2  Subjective: This 56 Years 25 Months old Female presents for surgery followup.  S/p Right partial mastectomy. Patient   has no  significant complaints.Swelling returned but no pressure sensation.  Had some nausea with Bactrim but better now.  No increased redness or fever or chills since abx completed.   Social History:   Social History  Age: 56 Years 9 Months Alcohol: 3 glasses wine/ week Recreational drug(s): none    Smoking Status: Current every day smoker    Allergies:  Allergies Insert Code: No allergies found.     Objective: General: Well  appearing,  well nourished in no distress. Incision healing well.  Some minimal surrounding erythema.  No evidence of frank cellulitis.  Some seroma.  Not as tense as last week.   Assessment:   Diagnosis &amp; Procedure: DiagnosisCode: V67.00, ProcedureCode: 16109,    PO UEA:[id number]  Plan:  Continue local care.  Patient will need port and has asked for placement.  Will discuss patient's case with Dr. Mariel Sleet but feel this can be placed now with plans to proceed with treatments once wound continues to heal.    Follow-up:Weeks 2 or after port placement.                        Active Diagnosis and Procedures: V67.00 Follow-up examination following surgery, unspecified   99499 - UNLISTED EVALUATION AND MANAGEMENT SERVICE

## 2011-07-23 ENCOUNTER — Encounter (HOSPITAL_COMMUNITY): Payer: Self-pay | Admitting: Anesthesiology

## 2011-07-23 ENCOUNTER — Ambulatory Visit (HOSPITAL_COMMUNITY)
Admission: RE | Admit: 2011-07-23 | Discharge: 2011-07-23 | Disposition: A | Discharge status: Home / Self Care | Payer: Medicaid Other | Source: Ambulatory Visit | Attending: General Surgery | Admitting: General Surgery

## 2011-07-23 ENCOUNTER — Ambulatory Visit (HOSPITAL_COMMUNITY): Payer: Medicaid Other

## 2011-07-23 ENCOUNTER — Ambulatory Visit (HOSPITAL_COMMUNITY): Payer: Medicaid Other | Admitting: Anesthesiology

## 2011-07-23 ENCOUNTER — Encounter (HOSPITAL_COMMUNITY)
Admission: RE | Disposition: A | Discharge status: Home / Self Care | Payer: Self-pay | Source: Ambulatory Visit | Attending: General Surgery

## 2011-07-23 DIAGNOSIS — I10 Essential (primary) hypertension: Secondary | ICD-10-CM | POA: Insufficient documentation

## 2011-07-23 DIAGNOSIS — C50919 Malignant neoplasm of unspecified site of unspecified female breast: Secondary | ICD-10-CM | POA: Insufficient documentation

## 2011-07-23 DIAGNOSIS — Z79899 Other long term (current) drug therapy: Secondary | ICD-10-CM | POA: Insufficient documentation

## 2011-07-23 HISTORY — PX: PORTACATH PLACEMENT: SHX2246

## 2011-07-23 SURGERY — INSERTION, TUNNELED CENTRAL VENOUS DEVICE, WITH PORT
Anesthesia: Monitor Anesthesia Care | Laterality: Left | Wound class: Clean

## 2011-07-23 MED ORDER — PROPOFOL 10 MG/ML IV EMUL
INTRAVENOUS | Status: DC | PRN
  Administered 2011-07-23: 50 ug/kg/min via INTRAVENOUS

## 2011-07-23 MED ORDER — CIPROFLOXACIN IN D5W 400 MG/200ML IV SOLN
INTRAVENOUS | Status: AC
  Filled 2011-07-23: qty 200

## 2011-07-23 MED ORDER — ONDANSETRON HCL 4 MG/2ML IJ SOLN: 4.0000 mg | Freq: Once | INTRAMUSCULAR | Status: DC | PRN

## 2011-07-23 MED ORDER — LIDOCAINE HCL (PF) 1 % IJ SOLN
INTRAMUSCULAR | Status: DC | PRN
  Administered 2011-07-23: 20 mL

## 2011-07-23 MED ORDER — LEVOFLOXACIN 500 MG PO TABS: 500.0000 mg | ORAL_TABLET | Freq: Every day | ORAL | Status: AC

## 2011-07-23 MED ORDER — SODIUM CHLORIDE 0.9 % IV SOLN
INTRAVENOUS | Status: DC | PRN
  Administered 2011-07-23: 500 mL via INTRAMUSCULAR

## 2011-07-23 MED ORDER — FENTANYL CITRATE 0.05 MG/ML IJ SOLN
INTRAMUSCULAR | Status: DC | PRN
  Administered 2011-07-23: 50 ug via INTRAVENOUS

## 2011-07-23 MED ORDER — PROPOFOL 10 MG/ML IV EMUL
INTRAVENOUS | Status: AC
  Filled 2011-07-23: qty 20

## 2011-07-23 MED ORDER — FENTANYL CITRATE 0.05 MG/ML IJ SOLN: 25.0000 ug | INTRAMUSCULAR | Status: DC | PRN

## 2011-07-23 MED ORDER — LACTATED RINGERS IV SOLN
INTRAVENOUS | Status: DC
  Administered 2011-07-23: 10:00:00 via INTRAVENOUS

## 2011-07-23 MED ORDER — LIDOCAINE HCL (PF) 1 % IJ SOLN
INTRAMUSCULAR | Status: AC
  Filled 2011-07-23: qty 5

## 2011-07-23 MED ORDER — MIDAZOLAM HCL 2 MG/2ML IJ SOLN
1.0000 mg | INTRAMUSCULAR | Status: DC | PRN
  Administered 2011-07-23: 2 mg via INTRAVENOUS

## 2011-07-23 MED ORDER — LIDOCAINE HCL (PF) 1 % IJ SOLN
INTRAMUSCULAR | Status: AC
  Filled 2011-07-23: qty 30

## 2011-07-23 MED ORDER — FENTANYL CITRATE 0.05 MG/ML IJ SOLN
INTRAMUSCULAR | Status: AC
  Filled 2011-07-23: qty 2

## 2011-07-23 MED ORDER — ROCURONIUM BROMIDE 50 MG/5ML IV SOLN
INTRAVENOUS | Status: AC
  Filled 2011-07-23: qty 1

## 2011-07-23 MED ORDER — CIPROFLOXACIN IN D5W 400 MG/200ML IV SOLN: 400.0000 mg | INTRAVENOUS | Status: DC

## 2011-07-23 MED ORDER — HYDROCODONE-ACETAMINOPHEN 5-325 MG PO TABS: 1.0000 | ORAL_TABLET | ORAL | Status: AC | PRN

## 2011-07-23 MED ORDER — MIDAZOLAM HCL 2 MG/2ML IJ SOLN
INTRAMUSCULAR | Status: AC
  Administered 2011-07-23: 2 mg via INTRAVENOUS
  Filled 2011-07-23: qty 2

## 2011-07-23 MED ORDER — LACTATED RINGERS IV SOLN
INTRAVENOUS | Status: DC | PRN
  Administered 2011-07-23: 11:00:00 via INTRAVENOUS

## 2011-07-23 MED ORDER — HEPARIN SODIUM (PORCINE) 1000 UNIT/ML IJ SOLN
INTRAMUSCULAR | Status: AC
  Filled 2011-07-23: qty 3

## 2011-07-23 MED ORDER — HEPARIN SODIUM (PORCINE) 1000 UNIT/ML IJ SOLN
INTRAMUSCULAR | Status: DC | PRN
  Administered 2011-07-23: 3000 [IU] via INTRAVENOUS

## 2011-07-23 SURGICAL SUPPLY — 44 items
APL SKNCLS STERI-STRIP NONHPOA (GAUZE/BANDAGES/DRESSINGS) ×1
APPLIER CLIP 9.375 SM OPEN (CLIP)
APR CLP SM 9.3 20 MLT OPN (CLIP)
BAG DECANTER FOR FLEXI CONT (MISCELLANEOUS) ×2 IMPLANT
BAG HAMPER (MISCELLANEOUS) ×2 IMPLANT
BENZOIN TINCTURE PRP APPL 2/3 (GAUZE/BANDAGES/DRESSINGS) ×2 IMPLANT
CATH HICKMAN DUAL 12.0 (CATHETERS) IMPLANT
CLIP APPLIE 9.375 SM OPEN (CLIP) IMPLANT
CLOTH BEACON ORANGE TIMEOUT ST (SAFETY) ×2 IMPLANT
COVER LIGHT HANDLE STERIS (MISCELLANEOUS) ×4 IMPLANT
DECANTER SPIKE VIAL GLASS SM (MISCELLANEOUS) ×2 IMPLANT
DRAPE C-ARM FOLDED MOBILE STRL (DRAPES) ×2 IMPLANT
DURAPREP 6ML APPLICATOR 50/CS (WOUND CARE) ×2 IMPLANT
ELECT REM PT RETURN 9FT ADLT (ELECTROSURGICAL) ×2
ELECTRODE REM PT RTRN 9FT ADLT (ELECTROSURGICAL) ×1 IMPLANT
GLOVE BIOGEL PI IND STRL 7.5 (GLOVE) ×1 IMPLANT
GLOVE BIOGEL PI INDICATOR 7.5 (GLOVE) ×1
GLOVE ECLIPSE 6.5 STRL STRAW (GLOVE) ×1 IMPLANT
GLOVE ECLIPSE 7.0 STRL STRAW (GLOVE) ×3 IMPLANT
GLOVE EXAM NITRILE MD LF STRL (GLOVE) ×1 IMPLANT
GLOVE INDICATOR 7.0 STRL GRN (GLOVE) ×1 IMPLANT
GOWN STRL REIN XL XLG (GOWN DISPOSABLE) ×4 IMPLANT
IV NS 500ML (IV SOLUTION) ×2
IV NS 500ML BAXH (IV SOLUTION) ×1 IMPLANT
KIT PORT POWER 8FR ISP MRI (CATHETERS) ×2 IMPLANT
KIT ROOM TURNOVER APOR (KITS) ×2 IMPLANT
MANIFOLD NEPTUNE II (INSTRUMENTS) ×2 IMPLANT
NDL HYPO 18GX1.5 BLUNT FILL (NEEDLE) ×1 IMPLANT
NDL HYPO 25X1 1.5 SAFETY (NEEDLE) ×1 IMPLANT
NEEDLE HYPO 18GX1.5 BLUNT FILL (NEEDLE) ×2 IMPLANT
NEEDLE HYPO 25X1 1.5 SAFETY (NEEDLE) ×2 IMPLANT
NS IRRIG 1000ML POUR BTL (IV SOLUTION) ×2 IMPLANT
PACK MINOR (CUSTOM PROCEDURE TRAY) ×2 IMPLANT
PAD ARMBOARD 7.5X6 YLW CONV (MISCELLANEOUS) ×2 IMPLANT
SET BASIN LINEN APH (SET/KITS/TRAYS/PACK) ×2 IMPLANT
SET INTRODUCER 12FR PACEMAKER (SHEATH) IMPLANT
SHEATH COOK PEEL AWAY SET 8F (SHEATH) IMPLANT
STRIP CLOSURE SKIN 1/2X4 (GAUZE/BANDAGES/DRESSINGS) ×2 IMPLANT
SUT MNCRL AB 4-0 PS2 18 (SUTURE) ×2 IMPLANT
SUT VIC AB 3-0 SH 27 (SUTURE) ×2
SUT VIC AB 3-0 SH 27X BRD (SUTURE) ×1 IMPLANT
SYR 20CC LL (SYRINGE) ×1 IMPLANT
SYR CONTROL 10ML LL (SYRINGE) ×2 IMPLANT
SYRINGE 10CC LL (SYRINGE) ×2 IMPLANT

## 2011-07-23 NOTE — Anesthesia Postprocedure Evaluation (Signed)
  Anesthesia Post-op Note  Patient: Danielle [name]  Procedure(s) Performed: Procedure(s) (LRB): INSERTION PORT-A-CATH (Left)  Patient Location: PACU  Anesthesia Type: MAC  Level of Consciousness: awake, alert  and oriented  Airway and Oxygen Therapy: Patient Spontanous Breathing and Patient connected to face mask oxygen  Post-op Pain: none  Post-op Assessment: Post-op Vital signs reviewed, Patient's Cardiovascular Status Stable, Respiratory Function Stable, Patent Airway and No signs of Nausea or vomiting  Post-op Vital Signs: Reviewed and stable  Complications: No apparent anesthesia complications

## 2011-07-23 NOTE — Interval H&P Note (Signed)
History and Physical Interval Note:  07/23/2011 10:34 AM  Danielle [name]  has presented today for surgery, with the diagnosis of breast cancer  The various methods of treatment have been discussed with the patient and family. After consideration of risks, benefits and other options for treatment, the patient has consented to  Procedure(s) (LRB): INSERTION PORT-A-CATH (N/A) as a surgical intervention .  The patients' history has been reviewed, patient examined, no change in status, stable for surgery.  I have reviewed the patients' chart and labs.  Questions were answered to the patient's satisfaction.     Danielle [name]

## 2011-07-23 NOTE — Transfer of Care (Signed)
Immediate Anesthesia Transfer of Care Note  Patient: Danielle [name]  Procedure(s) Performed: Procedure(s) (LRB): INSERTION PORT-A-CATH (Left)  Patient Location: PACU  Anesthesia Type: MAC  Level of Consciousness: awake, alert  and oriented  Airway & Oxygen Therapy: Patient Spontanous Breathing and Patient connected to nasal cannula oxygen  Post-op Assessment: Report given to PACU RN, Post -op Vital signs reviewed and stable and Patient moving all extremities X 4  Post vital signs: Reviewed and stable  Complications: No apparent anesthesia complications

## 2011-07-23 NOTE — Op Note (Deleted)
Patient:  Danielle [name]  DOB:  01/23/1956  MRN:  [id number]   Preop Diagnosis:  Cholelithiasis  Postop Diagnosis:  The same  Procedure:  Laparoscopic cholecystectomy  Surgeon:  Dr. Tilford Pillar  Anes:  General endotracheal, 0.5% Sensorcaine plain for local  Indications:  Patient is a 56 year old female presented my office with a history of right upper quadrant epigastric abdominal pain. Workup and evaluation was consistent for acute cholecystitis cholelithiasis. Risks benefits alternatives a laparoscopic possible open cholecystectomy were discussed at length the patient including but not limited to risk of bleeding, infection, bile leak, small bowel injury, common bile duct injury, intraoperative cardiac and pulmonary events. Patient's questions and concerns were addressed the patient was consented for the planned procedure.  Procedure note:  Patient is taken to the OR space the supine position on the or table which time the general anesthetic is a Optician, dispensing. Once patient was asleep she was endotracheally intubated by the nurse anesthetist. At this point her abdomen is prepped with DuraPrep solution and draped in standard fashion. A stab incision was created supraumbilically with 11 blade scalpel with additional dissection down to subcuticular tissue carried out using a Coker clamp. At this point the fascia is grasped Coker clamp and was lifted anteriorly. A Veress needle is inserted saline drop test is utilized confirm intraperitoneal placement and then pneumoperitoneum was initiated. Once sufficient pneumoperitoneum was obtained an 11 mm insert over a laparoscope allowing visualization the trocar entering into the peritoneal cavity. At this point the inner cannulas removed the laparoscope was reinserted there is no evidence of any trocar or Veress needle placement injury. At this point the remaining trochars replaced with a 5 mm trocar in the epigastrium, 5 mm in the midline, and a 5 mm trocar in  the right lateral abdominal wall. Patient's placed into a reverse Trendelenburg left lateral decubitus position. The fundus of the gallbladder was grasped and lifted up and over the right lobe liver. Several omental adhesions were bluntly stripped off the gallbladder with a [place] dissector and a combination of blunt and electrocautery dissection. The infundibulum was identified the peritoneal reflection was dissected off the infundibulum using a Art gallery manager. This exposes both the cystic duct and cystic artery. A window was created behind the cystic duct. 3 endoclips were placed proximally one distally and the cystic duct was divided to 2 most distal clips. Similarly the cystic artery had a window behind the cystic artery. 2 endoclips placed proximally one distally and the cystic artery was divided between 2 most distal clips. During retraction a small cholecystotomy was created with a regular grasper in the gallbladder wall. There was some bile spillage but no stones. This was quickly controlled by grasping it with the regular grasper. Electrocautery was utilized to dissect the gallbladder free from the gallbladder fossa. Once the gallbladder is free is placed into an Endo Catch bag and placed into the right lower quadrant. In order to facilitate placement of the Endo Catch bag the 10 mm scope is exchanged for a 5 mm scope at this time. At this time I did copiously irrigate the right upper quadrant with sterile saline to the returning*clear. The gallbladder fossa was inspected as well as the endoclips. The endoclips were noted be in excellent position with no evidence of any bleeding or bile leak. The gallbladder fossa is noted be brawny did opt to place a piece of Surgicel snow into the gallbladder fossa. At this point I change my attention to closure.  Using  Endo Close suture passing device a 2-0 Vicryl suture was passed to the umbilical trocar site. With this suture and placed the gallbladder was  retrieved was removed through the umbilical trocar site and intact Endo Catch bag. The gallbladder was placed on the back table and sent as a permanent specimen to pathology. At this point the pneumoperitoneum was evacuated. The trochars were removed. The Vicryl suture was secured. The local anesthetic was instilled at all 4 trocar sites. The skin was reapproximated all 4 trocar sites with a or 0 Monocryl in a running subcuticular suture. The skin was washed dried moist dry towel. Benzoin is applied around incision. Half-inch are suture placed. The drapes removed the patient left come out of general anesthetic and stretcher the PACU in stable condition. At the conclusion of procedure all instrument, sponge, needle counts are correct. Patient tolerated procedure she well.  Complications:  None apparent  EBL:  Minimal  Specimen:  Gallbladder

## 2011-07-23 NOTE — Anesthesia Preprocedure Evaluation (Addendum)
Anesthesia Evaluation  Patient identified by MRN, date of birth, ID band Patient awake    Airway Mallampati: II TM Distance: <3 FB     Dental  (+) Edentulous Upper and Edentulous Lower   Pulmonary neg pulmonary ROS,  breath sounds clear to auscultation        Cardiovascular hypertension, Pt. on medications Rhythm:Regular Rate:Normal     Neuro/Psych    GI/Hepatic   Endo/Other    Renal/GU      Musculoskeletal   Abdominal   Peds  Hematology   Anesthesia Other Findings   Reproductive/Obstetrics                          Anesthesia Physical Anesthesia Plan  ASA: II  Anesthesia Plan: MAC   Post-op Pain Management:    Induction: Intravenous  Airway Management Planned: Nasal Cannula  Additional Equipment:   Intra-op Plan:   Post-operative Plan:   Informed Consent: I have reviewed the patients History and Physical, chart, labs and discussed the procedure including the risks, benefits and alternatives for the proposed anesthesia with the patient or authorized representative who has indicated his/her understanding and acceptance.     Plan Discussed with:   Anesthesia Plan Comments:         Anesthesia Quick Evaluation

## 2011-07-23 NOTE — Op Note (Signed)
Patient:  Danielle [name]  DOB:  December 06, 1955  MRN:  [id number]   Preop Diagnosis:  Breast cancer  Postop Diagnosis:  The same  Procedure:  Left subclavian vein power port placement and intraoperative fluoroscopy with surgeon interpretation.  Surgeon:  Dr. Tilford Pillar  Anes:  MAC, 1% lidocaine plain for local  Indications:  Patient is a 56 year old female well-known to me with a history of right-sided breast cancer. She has been falling with Dr. Mariel Sleet and plans are to initiate chemotherapy and radiation therapy. Placement of a port was discussed with the patient. Risks benefits alternatives were discussed. Risk including but not limited to risk of bleeding, infection, pneumothorax were discussed. Patient's questions and concerns were addressed the patient as consented for the planned procedure.  Procedure note:  Patient was taken to the operating room was placed in supine position on the operator table time the MAC sedation was a Optician, dispensing. Once the patient was asleep dated her left neck and chest were prepped with DuraPrep solution and draped in standard fashion.  She was placed into a Trendelenburg position. Local anesthetic was instilled along the planned course of venipuncture. An 18-gauge introducer needle was utilized to identify the left subclavian vein. Good venous return was obtained. A J-wire is advanced without any difficulties and was observed with fluoroscopy coursing down the superior vena cava. The J-wire was secured to the drapes with a hemostat. At this time her my attention to creation of the subcuticular pocket. Local anesthetic was again instilled. A stab incision was created with this 15 blade scalpel and the port site was enlarged using combination of blunt and electrocautery dissection. Upon adequately creating the port site the catheter was advanced from the port site to the venipuncture stab incision via a subcuticular tunneling device. A dilator insertion sheath combination  was advanced again under visualization with fluoroscopy down the J-wire. The course moved easily without any difficulty. The J-wire and dilator removed in standard fashion. The catheter was advanced down the insertion sheath into the superior vena cava confirming the orientation with fluoroscopy. At this point the insertion sheath was split and removed in standard fashion. The catheter was trimmed at 22 cm. He was attached to the port which is placed to the subcuticular pocket. The port was then accessed with good venous return. The port was flushed with 3000 units of heparin. The course of the catheter was followed using fluoroscopy there is no evidence of any sharp angulation or kinking. At this point attention was turned to closure. The deep subcuticular pocket was reapproximated with a 3-0 Vicryl and running continuous fashion. The skin edges at both incisions were closed with a 4-0 Monocryl in a running subcuticular suture. The skin was washed dried moist dry towel. Benzoin is applied around incision. Half-inch Steri-Strips are placed. The drapes removed the patient was allowed to come out of sedation and stretcher the PACU in stable condition. At the conclusion of procedure all instrument, sponge, needle counts are correct. Patient tolerated procedure she well.  Complications:  None  EBL:  Normal  Specimen:  None

## 2011-07-23 NOTE — Preoperative (Signed)
Beta Blockers   Reason not to administer Beta Blockers:Not Applicable 

## 2011-07-24 ENCOUNTER — Encounter (HOSPITAL_COMMUNITY): Payer: Self-pay | Admitting: General Surgery

## 2011-08-06 ENCOUNTER — Encounter (HOSPITAL_COMMUNITY): Payer: Medicaid Other | Attending: Oncology | Admitting: Oncology

## 2011-08-06 VITALS — BP 172/99 | HR 69 | Temp 98.4°F | Ht 63.5 in | Wt 180.7 lb

## 2011-08-06 DIAGNOSIS — C50419 Malignant neoplasm of upper-outer quadrant of unspecified female breast: Secondary | ICD-10-CM

## 2011-08-06 DIAGNOSIS — C50919 Malignant neoplasm of unspecified site of unspecified female breast: Secondary | ICD-10-CM | POA: Insufficient documentation

## 2011-08-06 DIAGNOSIS — Z170 Estrogen receptor positive status [ER+]: Secondary | ICD-10-CM

## 2011-08-06 DIAGNOSIS — C801 Malignant (primary) neoplasm, unspecified: Secondary | ICD-10-CM | POA: Insufficient documentation

## 2011-08-06 DIAGNOSIS — C773 Secondary and unspecified malignant neoplasm of axilla and upper limb lymph nodes: Secondary | ICD-10-CM

## 2011-08-06 DIAGNOSIS — J449 Chronic obstructive pulmonary disease, unspecified: Secondary | ICD-10-CM

## 2011-08-06 MED ORDER — LORAZEPAM 1 MG PO TABS: 1.0000 mg | ORAL_TABLET | ORAL | Status: DC | PRN

## 2011-08-06 MED ORDER — PROCHLORPERAZINE 25 MG RE SUPP: 25.0000 mg | Freq: Four times a day (QID) | RECTAL | Status: DC | PRN

## 2011-08-06 MED ORDER — ONDANSETRON HCL 8 MG PO TABS: ORAL_TABLET | ORAL | Status: DC

## 2011-08-06 MED ORDER — DEXAMETHASONE 4 MG PO TABS: ORAL_TABLET | ORAL | Status: DC

## 2011-08-06 NOTE — Addendum Note (Signed)
Addended by: Oda Kilts on: 08/06/2011 03:13 PM   Modules accepted: Orders

## 2011-08-06 NOTE — Patient Instructions (Addendum)
[facility]  Discharge Instructions Danielle [name]  [id number] [birth year] Dr. Glenford Peers   RECOMMENDATIONS MADE BY THE CONSULTANT AND ANY TEST RESULTS WILL BE SENT TO YOUR REFERRING DOCTOR.   EXAM FINDINGS BY MD TODAY AND SIGNS AND SYMPTOMS TO REPORT TO CLINIC OR PRIMARY MD:    Chemo teaching on Adriamycin/Cytoxan [facility]) and Taxol June 25 @ 3 pm with Rolly Salter  Chemo every 21 days.   You are scheduled for this Thursday June 27 @ 9:45 for chemo.  I acknowledge that I have been informed and understand all the instructions given to me and received a copy. I do not have any more questions at this time, but understand that I may call the Specialty Clinic at [facility] at [phone] during business hours should I have any further questions or need assistance in obtaining follow-up care.    __________________________________________  _____________  __________ Signature of Patient or Authorized Representative            Date                   Time    __________________________________________ Nurse's Signature

## 2011-08-06 NOTE — Patient Instructions (Addendum)
[facility] Danielle [name]  [id number] [birth year] Dr. Glenford Peers    CHEMOTHERAPY INSTRUCTIONS  Adriamycin - bone marrow suppression (low white blood cells/low platelets/low red blood cells), hair loss, nausea/vomiting, fatigue, mouth sores, cardiotoxicity (this is why you will have 2D echoes performed periodically during treatment), sensitivity to light - wear sunscreen and sunglasses. This will turn your urine RED for 24-48 hours. Your urine should clear or turn back to yellow within 48 hours. If your urine doesn't clear/turn yellow, please call the Cancer Center [phone].  Cytoxan is a chemotherapy drug. Cytoxan can cause nausea and vomiting and of course hair loss. It can also cause hemorrhagic cystitis (bloody urine) because the chemo irritates your bladder. You need to push fluids when on this chemo, preferably water(64 oz a day). Do not hold your urine. Urinate before you go to bed and if you wake up during the night go to the bathroom.   Taxotere - bone marrow suppression (lowers white blood cells (fight infection), lowers red blood cells (make up your blood), lowers platelets (help blood to clot). This chemo can cause fluid retention. You will be responsible for taking a steroid called Dexamethasone at home prior to and after Taxotere. This steroid will keep you from having fluid retention. Take it whether you think you need it or not. Can cause hair loss, skin/nail changes (darkening of the nail beds, pain where the nail bed meets the skin, loosening of the nail beds, dry skin, palms of hands and soles of feet may darken or get sensitive, nausea/vomiting, paresthesia (numbness or tingling) in extremities - we need to know if this develops, mucositis (inflammation of any mucosal membrane - the mouth, throat), mouth sores, neurotoxicity (loss of memory, headaches, trouble sleeping, etc.), can also cause excessive tear production. Please let us know if any side  effect develops.   Neulasta - this is an injection given 20-24 hours after the completion of chemo. It is given because chemo destroys can also destroy healthy cells. Your bone marrow is what produces white blood cells. Since chemo destroys white blood cells (which protect [place] from infection) we must give this medication. This medication stimulates the bone marrow to create more white blood cells. So your bone marrow is essentially working over time to produce white blood cells. The most common side effect of this medication is bone pain. The bone pain comes from the bone marrow working overtime to produce white blood cells. The bone pain can start a few hours after injection to several days after injection. The bone pain can occur anywhere there is bone but typically occurs in the spine, hips, upper legs. The drug of choice to help relieve this pain/discomfort is Aleve. Take as the bottle directs. If you can not take Aleve or Ibuprofen please let us know. We will direct you to take Tylenol. You must tell us if you have any bone pain associated with this injection.    GENERAL / POTENTIAL SIDE EFFECTS OF TREATMENT: Increased Susceptibility to Infection, Vomiting, Constipation, Red or Pink Urine (with Adriamycin), Hair Thinning, Changes in Character of Skin and Nails (brittleness, dryness,etc.), Pigment Changes (darkening of nail beds, palms of hands, soles of feet, etc.), Bone Marrow Suppression, Abdominal Cramping, Urinary Frequency, Blood in Urine, Complete Hair Loss, Nausea, Diarrhea, Painful Urination, Pus in Urine, Sun Sensitivity and Mouth Sores   SELF IMAGE NEEDS AND REFERRALS MADE: Obtain hair accessories as soon as possible (wigs, scarves, turbans,caps,etc.) and Referral to Look  Good, Feel Better consultant   EDUCATIONAL MATERIALS GIVEN AND REVIEWED: Chemotherapy and You   SELF CARE ACTIVITIES WHILE ON CHEMOTHERAPY: Increase your fluid intake 48 hours prior to treatment and drink at least 2  quarts (64 oz)  per day after treatment., No alcohol intake., No aspirin or other medications unless approved by your oncologist., Eat foods that are light and easy to digest., Eat foods at cold or room temperature., No fried, fatty, or spicy foods immediately before or after treatment., Have teeth cleaned professionally before starting treatment. Keep dentures and partial plates clean., Use soft toothbrush and do not use mouthwashes that contain alcohol. Biotene is a good mouthwash that is available at most pharmacies or may be ordered by calling [phone]., Use warm salt water gargles (1 teaspoon salt per 1 quart warm water) before and after meals and at bedtime. Or you may rinse with 2 tablespoons of three -percent hydrogen peroxide mixed in eight ounces of water., Always use sunscreen with SPF (Sun Protection Factor) of 30 or higher., Use your nausea medication as directed to prevent nausea., Use your stool softener or laxative as directed to prevent constipation. and Use your anti-diarrheal medication as directed to stop diarrhea.  Please wash your hands for at least 30 seconds using warm soapy water. Handwashing is the #1 way to prevent the spread of germs. Stay away from sick people or people who are getting over a cold. If you develop respiratory systems such as green/yellow mucus production or productive cough or persistent cough let us know and we will see if you need an antibiotic. It is a good idea to keep a pair of gloves on when going into grocery stores/[employer] to decrease your risk of coming into contact with germs on the carts, etc. Carry alcohol hand gel with you at all times and use it frequently if out in public. All foods need to be cooked thoroughly. No raw foods. No medium or undercooked meats, eggs. If your food is cooked medium well, it does not need to be hot pink or saturated with bloody liquid at all. Vegetables and fruits need to be washed/rinsed under the faucet with a dish  detergent before being consumed. You can eat raw fruits and vegetables unless we tell you otherwise but it would be best if you cooked them or bought frozen. Do not eat off of salad bars or hot bars unless you really trust the cleanliness of the restaurant. If you need dental work, please let Dr. Mariel Sleet know before you go for your appointment so that we can coordinate the best possible time for you in regards to your chemo regimen. You need to also let your dentist know that you are actively taking chemo. We may need to do labs prior to your dental appointment. We also want your bowels moving at least every other day. If this is not happening, we need to know so that we can get you on a bowel regimen to help you go.       MEDICATIONS:  Dexamethasone 4mg  tablet  for [facility] treatment:  Starting the day after your [facility] treatment, take 2 tablets (8mg  total) in the am with food. Then for the next 2 days, take 2 tablets in the am and 2 tablets in the pm. This will be to help decrease nausea & vomiting. Take it whether you think you need it or not.   Dexamethasone 4 mg tablet for Taxotere treatment: the day before, day of, and day after  taxotere take 2 tablets in the am and 2 tablets in the pm. This will be to decrease the risk of having fluid retention from the taxotere. Take it whether you think you need it or not.  Ondansetron (zofran) for [facility] treatment: Take 1 tablet two times a day as needed for nausea or vomiting starting on the third day after chemotherapy.  Ondansetron (zofran) for Taxotere treatment: Starting the day after chemo take 1 tablet in the am and 1 tablet in the pm for 3 days. Then may take 1 tablet twice a day if needed for nausea/vomiting.  Lorazepam (ativan) 1mg  tablet. Take 1 tablet every 4 hours if needed for nausea/vomiting. May make you sleepy. Do not drive while taking this medication.   Compazine 25mg  suppository. Insert 1 rectally every 6 hours if needed for  nausea/vomiting.  EMLA cream. Apply a quarter sized amount to port site 1 hour prior to chemo. Do not rub in. Cover with plastic.    SYMPTOMS TO REPORT AS SOON AS POSSIBLE AFTER TREATMENT:  FEVER GREATER THAN 100.5 F  CHILLS WITH OR WITHOUT FEVER  NAUSEA AND VOMITING THAT IS NOT CONTROLLED WITH YOUR NAUSEA MEDICATION  UNUSUAL SHORTNESS OF BREATH  UNUSUAL BRUISING OR BLEEDING  TENDERNESS IN MOUTH AND THROAT WITH OR WITHOUT PRESENCE OF ULCERS  URINARY PROBLEMS  BOWEL PROBLEMS  UNUSUAL RASH    Wear comfortable clothing and clothing appropriate for easy access to any Portacath or PICC line. Let us know if there is anything that we can do to make your therapy better!      I have been informed and understand all of the instructions given to me and have received a copy. I have been instructed to call the clinic [phone] or my family physician as soon as possible for continued medical care, if indicated. I do not have any more questions at this time but understand that I may call the Cancer Center or the Patient Navigator at [phone] during office hours should I have questions or need assistance in obtaining follow-up care.      _________________________________________      _______________     __________ Signature of Patient or Authorized Representative        Date                            Time      _________________________________________ Nurse's Signature

## 2011-08-06 NOTE — Progress Notes (Signed)
Problem #1 stage II cancer the right breast with a 3.2 cm primary that's grade 2 with 3 of 12 positive nodes with extranodal extension LV I was ER positive and PR positive. HER-2/neu is negative and her Ki-67 marker is high at 33%. CT scans and bone scan showed no evidence of metastatic disease.  Problem 2 COPD still smoking about half a pack of cigarettes a day that she tried uses electronic cigarette to quit  History of excessive alcohol use the past no longer drinking.  She is here today with her sister and she is still bothered by [place] Aromasin but is less. She has had her port placed and she is basically finish with her antibiotics. The right breast is still slightly pinkish especially over the incision site area but it does not look infected. She has no nodes in any location including supraclavicular infraclavicular or cervical axillary areas. She is afebrile and a.  My plan is to start a.c. x4 cycles followed Taxotere x4 cycles all 21 days apart. She is to see Dr. Basilio Cairo in the morning for consultation  We'll try to get started this Thursday then the radiation will be after chemotherapy and then will move on to hormonal therapy with an AI after radiation therapy. We will see her in about 4 weeks

## 2011-08-06 NOTE — Addendum Note (Signed)
Addended by: Oda Kilts on: 08/06/2011 04:02 PM   Modules accepted: Orders

## 2011-08-07 ENCOUNTER — Ambulatory Visit (HOSPITAL_COMMUNITY)
Admission: RE | Admit: 2011-08-07 | Discharge: 2011-08-07 | Disposition: A | Discharge status: Home / Self Care | Payer: Medicaid Other | Source: Ambulatory Visit | Attending: Oncology | Admitting: Oncology

## 2011-08-07 ENCOUNTER — Encounter (HOSPITAL_COMMUNITY): Payer: Medicaid Other

## 2011-08-07 ENCOUNTER — Inpatient Hospital Stay (HOSPITAL_COMMUNITY): Payer: Medicaid Other

## 2011-08-07 DIAGNOSIS — C50919 Malignant neoplasm of unspecified site of unspecified female breast: Secondary | ICD-10-CM

## 2011-08-07 DIAGNOSIS — I369 Nonrheumatic tricuspid valve disorder, unspecified: Secondary | ICD-10-CM

## 2011-08-07 DIAGNOSIS — I517 Cardiomegaly: Secondary | ICD-10-CM | POA: Insufficient documentation

## 2011-08-07 DIAGNOSIS — Z79899 Other long term (current) drug therapy: Secondary | ICD-10-CM | POA: Insufficient documentation

## 2011-08-07 NOTE — Progress Notes (Unsigned)
Chemo consent signed and teaching done for adriamycin/cytoxan/taxotere

## 2011-08-07 NOTE — Progress Notes (Signed)
*  PRELIMINARY RESULTS* Echocardiogram 2D Echocardiogram has been performed.  Danielle [name] 08/07/2011, 3:34 PM

## 2011-08-09 ENCOUNTER — Encounter (HOSPITAL_BASED_OUTPATIENT_CLINIC_OR_DEPARTMENT_OTHER): Payer: Medicaid Other

## 2011-08-09 VITALS — BP 124/75 | HR 68 | Temp 98.0°F | Wt 180.8 lb

## 2011-08-09 DIAGNOSIS — Z5111 Encounter for antineoplastic chemotherapy: Secondary | ICD-10-CM

## 2011-08-09 DIAGNOSIS — C773 Secondary and unspecified malignant neoplasm of axilla and upper limb lymph nodes: Secondary | ICD-10-CM

## 2011-08-09 DIAGNOSIS — C50419 Malignant neoplasm of upper-outer quadrant of unspecified female breast: Secondary | ICD-10-CM

## 2011-08-09 DIAGNOSIS — C801 Malignant (primary) neoplasm, unspecified: Secondary | ICD-10-CM

## 2011-08-09 MED ORDER — PALONOSETRON HCL INJECTION 0.25 MG/5ML
0.2500 mg | Freq: Once | INTRAVENOUS | Status: AC
  Administered 2011-08-09: 0.25 mg via INTRAVENOUS

## 2011-08-09 MED ORDER — SODIUM CHLORIDE 0.9 % IV SOLN
Freq: Once | INTRAVENOUS | Status: AC
  Administered 2011-08-09: 11:00:00 via INTRAVENOUS
  Filled 2011-08-09: qty 5

## 2011-08-09 MED ORDER — HEPARIN SOD (PORK) LOCK FLUSH 100 UNIT/ML IV SOLN
INTRAVENOUS | Status: AC
  Filled 2011-08-09: qty 5

## 2011-08-09 MED ORDER — SODIUM CHLORIDE 0.9 % IV SOLN
Freq: Once | INTRAVENOUS | Status: AC
  Administered 2011-08-09: 11:00:00 via INTRAVENOUS

## 2011-08-09 MED ORDER — SODIUM CHLORIDE 0.9 % IV SOLN: 150.0000 mg | Freq: Once | INTRAVENOUS | Status: DC

## 2011-08-09 MED ORDER — PALONOSETRON HCL INJECTION 0.25 MG/5ML
INTRAVENOUS | Status: AC
  Filled 2011-08-09: qty 5

## 2011-08-09 MED ORDER — DEXAMETHASONE SODIUM PHOSPHATE 4 MG/ML IJ SOLN: 12.0000 mg | Freq: Once | INTRAMUSCULAR | Status: DC

## 2011-08-09 MED ORDER — DOXORUBICIN HCL CHEMO IV INJECTION 2 MG/ML
60.0000 mg/m2 | Freq: Once | INTRAVENOUS | Status: AC
  Administered 2011-08-09: 116 mg via INTRAVENOUS
  Filled 2011-08-09: qty 58

## 2011-08-09 MED ORDER — HEPARIN SOD (PORK) LOCK FLUSH 100 UNIT/ML IV SOLN
500.0000 [IU] | Freq: Once | INTRAVENOUS | Status: AC | PRN
  Administered 2011-08-09: 500 [IU]
  Filled 2011-08-09: qty 5

## 2011-08-09 MED ORDER — SODIUM CHLORIDE 0.9 % IV SOLN
600.0000 mg/m2 | Freq: Once | INTRAVENOUS | Status: AC
  Administered 2011-08-09: 1160 mg via INTRAVENOUS
  Filled 2011-08-09: qty 58

## 2011-08-10 ENCOUNTER — Encounter (HOSPITAL_BASED_OUTPATIENT_CLINIC_OR_DEPARTMENT_OTHER): Payer: Medicaid Other

## 2011-08-10 VITALS — BP 163/88 | HR 72 | Temp 98.1°F

## 2011-08-10 DIAGNOSIS — C773 Secondary and unspecified malignant neoplasm of axilla and upper limb lymph nodes: Secondary | ICD-10-CM

## 2011-08-10 DIAGNOSIS — Z5189 Encounter for other specified aftercare: Secondary | ICD-10-CM

## 2011-08-10 DIAGNOSIS — C801 Malignant (primary) neoplasm, unspecified: Secondary | ICD-10-CM

## 2011-08-10 DIAGNOSIS — C50419 Malignant neoplasm of upper-outer quadrant of unspecified female breast: Secondary | ICD-10-CM

## 2011-08-10 MED ORDER — PEGFILGRASTIM INJECTION 6 MG/0.6ML
6.0000 mg | Freq: Once | SUBCUTANEOUS | Status: AC
  Administered 2011-08-10: 6 mg via SUBCUTANEOUS

## 2011-08-10 MED ORDER — PEGFILGRASTIM INJECTION 6 MG/0.6ML
SUBCUTANEOUS | Status: AC
  Filled 2011-08-10: qty 0.6

## 2011-08-10 NOTE — Progress Notes (Signed)
Danielle [name] presents today for injection per MD orders. Neulasta 6mg  administered SQ in right Abdomen. Administration without incident. Patient tolerated well.

## 2011-08-14 ENCOUNTER — Encounter (HOSPITAL_COMMUNITY): Payer: Medicaid Other | Attending: Oncology | Admitting: Oncology

## 2011-08-14 DIAGNOSIS — R142 Eructation: Secondary | ICD-10-CM

## 2011-08-14 DIAGNOSIS — C801 Malignant (primary) neoplasm, unspecified: Secondary | ICD-10-CM | POA: Insufficient documentation

## 2011-08-14 DIAGNOSIS — K1370 Unspecified lesions of oral mucosa: Secondary | ICD-10-CM

## 2011-08-14 DIAGNOSIS — R141 Gas pain: Secondary | ICD-10-CM

## 2011-08-14 DIAGNOSIS — C50919 Malignant neoplasm of unspecified site of unspecified female breast: Secondary | ICD-10-CM | POA: Insufficient documentation

## 2011-08-14 DIAGNOSIS — K319 Disease of stomach and duodenum, unspecified: Secondary | ICD-10-CM

## 2011-08-14 DIAGNOSIS — B370 Candidal stomatitis: Secondary | ICD-10-CM | POA: Insufficient documentation

## 2011-08-14 MED ORDER — FIRST-DUKES MOUTHWASH MT SUSP: 5.0000 mL | Freq: Four times a day (QID) | OROMUCOSAL | Status: DC | PRN

## 2011-08-14 NOTE — Progress Notes (Signed)
Ma is seen as a walk-in today.  The patient reports burning sensation in the gastric region.  She admits to increased belching that is distasteful. She points the epigastric region when she describes her discomfort. She describes it as burning. She she denies that increased while lying down. She reports that she was prescribed Prilosec but has not yet picked up. I've encouraged her to ascertain this prescription.  She also complains of mouth soreness. On physical exam, I note some white plaques on the right side of buccal mucosa. She does have her upper dentures in. She denies any fevers or chills.  I've encouraged the patient pick up her prescribed Prilosec to help with acid reflux. I've E. scribed a prescription for [employer] Magic mouthwash.  He was informed to swish and swallow solution 5 mL of at 4 times daily until symptoms resolve. I have E. Scribed 240 mL of the solution.  The patient will return as scheduled for followup.  Rishaan Gunner

## 2011-08-17 ENCOUNTER — Encounter (HOSPITAL_BASED_OUTPATIENT_CLINIC_OR_DEPARTMENT_OTHER): Payer: Medicaid Other

## 2011-08-17 ENCOUNTER — Encounter (HOSPITAL_COMMUNITY): Payer: Self-pay | Admitting: Oncology

## 2011-08-17 ENCOUNTER — Telehealth (HOSPITAL_COMMUNITY): Payer: Self-pay | Admitting: *Deleted

## 2011-08-17 ENCOUNTER — Emergency Department (HOSPITAL_BASED_OUTPATIENT_CLINIC_OR_DEPARTMENT_OTHER): Payer: Medicaid Other | Admitting: Oncology

## 2011-08-17 ENCOUNTER — Inpatient Hospital Stay (HOSPITAL_COMMUNITY)
Admission: EM | Admit: 2011-08-17 | Discharge: 2011-08-20 | DRG: 809 | Disposition: A | Discharge status: Home / Self Care | Payer: Medicaid Other | Attending: Internal Medicine | Admitting: Internal Medicine

## 2011-08-17 ENCOUNTER — Emergency Department (HOSPITAL_COMMUNITY): Payer: Medicaid Other

## 2011-08-17 VITALS — BP 100/67 | HR 83 | Temp 102.1°F

## 2011-08-17 DIAGNOSIS — A0472 Enterocolitis due to Clostridium difficile, not specified as recurrent: Secondary | ICD-10-CM | POA: Diagnosis present

## 2011-08-17 DIAGNOSIS — D702 Other drug-induced agranulocytosis: Principal | ICD-10-CM | POA: Diagnosis present

## 2011-08-17 DIAGNOSIS — C801 Malignant (primary) neoplasm, unspecified: Secondary | ICD-10-CM

## 2011-08-17 DIAGNOSIS — Z9010 Acquired absence of unspecified breast and nipple: Secondary | ICD-10-CM

## 2011-08-17 DIAGNOSIS — C50919 Malignant neoplasm of unspecified site of unspecified female breast: Secondary | ICD-10-CM

## 2011-08-17 DIAGNOSIS — R1110 Vomiting, unspecified: Secondary | ICD-10-CM

## 2011-08-17 DIAGNOSIS — R112 Nausea with vomiting, unspecified: Secondary | ICD-10-CM

## 2011-08-17 DIAGNOSIS — D6959 Other secondary thrombocytopenia: Secondary | ICD-10-CM | POA: Diagnosis present

## 2011-08-17 DIAGNOSIS — D709 Neutropenia, unspecified: Secondary | ICD-10-CM

## 2011-08-17 DIAGNOSIS — I10 Essential (primary) hypertension: Secondary | ICD-10-CM | POA: Diagnosis present

## 2011-08-17 DIAGNOSIS — Y92009 Unspecified place in unspecified non-institutional (private) residence as the place of occurrence of the external cause: Secondary | ICD-10-CM

## 2011-08-17 DIAGNOSIS — K121 Other forms of stomatitis: Secondary | ICD-10-CM | POA: Diagnosis present

## 2011-08-17 DIAGNOSIS — C50911 Malignant neoplasm of unspecified site of right female breast: Secondary | ICD-10-CM

## 2011-08-17 DIAGNOSIS — R5081 Fever presenting with conditions classified elsewhere: Secondary | ICD-10-CM | POA: Diagnosis present

## 2011-08-17 DIAGNOSIS — Z803 Family history of malignant neoplasm of breast: Secondary | ICD-10-CM

## 2011-08-17 DIAGNOSIS — R6510 Systemic inflammatory response syndrome (SIRS) of non-infectious origin without acute organ dysfunction: Secondary | ICD-10-CM | POA: Diagnosis present

## 2011-08-17 DIAGNOSIS — B370 Candidal stomatitis: Secondary | ICD-10-CM

## 2011-08-17 DIAGNOSIS — R1013 Epigastric pain: Secondary | ICD-10-CM | POA: Diagnosis present

## 2011-08-17 DIAGNOSIS — F17200 Nicotine dependence, unspecified, uncomplicated: Secondary | ICD-10-CM | POA: Diagnosis present

## 2011-08-17 DIAGNOSIS — C773 Secondary and unspecified malignant neoplasm of axilla and upper limb lymph nodes: Secondary | ICD-10-CM

## 2011-08-17 DIAGNOSIS — C50912 Malignant neoplasm of unspecified site of left female breast: Secondary | ICD-10-CM

## 2011-08-17 DIAGNOSIS — C50419 Malignant neoplasm of upper-outer quadrant of unspecified female breast: Secondary | ICD-10-CM

## 2011-08-17 DIAGNOSIS — E860 Dehydration: Secondary | ICD-10-CM

## 2011-08-17 DIAGNOSIS — Z881 Allergy status to other antibiotic agents status: Secondary | ICD-10-CM

## 2011-08-17 DIAGNOSIS — I959 Hypotension, unspecified: Secondary | ICD-10-CM | POA: Diagnosis present

## 2011-08-17 DIAGNOSIS — T451X5A Adverse effect of antineoplastic and immunosuppressive drugs, initial encounter: Secondary | ICD-10-CM | POA: Diagnosis present

## 2011-08-17 HISTORY — DX: Malignant neoplasm of unspecified site of unspecified female breast: C50.919

## 2011-08-17 HISTORY — DX: Malignant neoplasm of unspecified site of right female breast: C50.911

## 2011-08-17 HISTORY — DX: Malignant neoplasm of unspecified site of left female breast: C50.912

## 2011-08-17 LAB — URINALYSIS, ROUTINE W REFLEX MICROSCOPIC
Bilirubin Urine: NEGATIVE
Glucose, UA: NEGATIVE mg/dL
Hgb urine dipstick: NEGATIVE
Specific Gravity, Urine: 1.01 (ref 1.005–1.030)
Urobilinogen, UA: 0.2 mg/dL (ref 0.0–1.0)

## 2011-08-17 LAB — CBC
MCHC: 34.3 g/dL (ref 30.0–36.0)
Platelets: 117 10*3/uL — ABNORMAL LOW (ref 150–400)
RDW: 12.5 % (ref 11.5–15.5)
WBC: 0.5 10*3/uL — CL (ref 4.0–10.5)

## 2011-08-17 LAB — COMPREHENSIVE METABOLIC PANEL
ALT: 50 U/L — ABNORMAL HIGH (ref 0–35)
Albumin: 3.5 g/dL (ref 3.5–5.2)
Alkaline Phosphatase: 73 U/L (ref 39–117)
BUN: 18 mg/dL (ref 6–23)
Calcium: 9.2 mg/dL (ref 8.4–10.5)
Potassium: 3.6 mEq/L (ref 3.5–5.1)
Sodium: 129 mEq/L — ABNORMAL LOW (ref 135–145)
Total Protein: 7 g/dL (ref 6.0–8.3)

## 2011-08-17 LAB — CARDIAC PANEL(CRET KIN+CKTOT+MB+TROPI)
CK, MB: 0.6 ng/mL (ref 0.3–4.0)
Troponin I: <0.3 ng/mL (ref <0.30)

## 2011-08-17 LAB — LIPASE, BLOOD: Lipase: 23 U/L (ref 11–59)

## 2011-08-17 LAB — DIFFERENTIAL

## 2011-08-17 LAB — MRSA PCR SCREENING: MRSA by PCR: NEGATIVE

## 2011-08-17 LAB — LACTIC ACID, PLASMA: Lactic Acid, Venous: 0.9 mmol/L (ref 0.5–2.2)

## 2011-08-17 MED ORDER — SODIUM CHLORIDE 0.9 % IV SOLN
Freq: Once | INTRAVENOUS | Status: AC
  Administered 2011-08-17: 16 mg via INTRAVENOUS
  Filled 2011-08-17: qty 8

## 2011-08-17 MED ORDER — PIPERACILLIN-TAZOBACTAM 3.375 G IVPB
3.3750 g | Freq: Three times a day (TID) | INTRAVENOUS | Status: DC
  Administered 2011-08-17 – 2011-08-20 (×8): 3.375 g via INTRAVENOUS
  Filled 2011-08-17 (×12): qty 50

## 2011-08-17 MED ORDER — SODIUM CHLORIDE 0.9 % IV SOLN
INTRAVENOUS | Status: AC
  Administered 2011-08-17: 1000 mL via INTRAVENOUS

## 2011-08-17 MED ORDER — ACETAMINOPHEN 325 MG PO TABS: 650.0000 mg | ORAL_TABLET | Freq: Four times a day (QID) | ORAL | Status: DC | PRN

## 2011-08-17 MED ORDER — PANTOPRAZOLE SODIUM 40 MG IV SOLR
40.0000 mg | INTRAVENOUS | Status: DC
  Administered 2011-08-17: 40 mg via INTRAVENOUS
  Filled 2011-08-17: qty 40

## 2011-08-17 MED ORDER — SODIUM CHLORIDE 0.9 % IV BOLUS (SEPSIS)
1000.0000 mL | Freq: Once | INTRAVENOUS | Status: AC
  Administered 2011-08-17: 1000 mL via INTRAVENOUS

## 2011-08-17 MED ORDER — MAGIC MOUTHWASH
5.0000 mL | Freq: Four times a day (QID) | ORAL | Status: DC | PRN
  Administered 2011-08-18: 5 mL via ORAL
  Filled 2011-08-17: qty 5

## 2011-08-17 MED ORDER — DEXAMETHASONE SODIUM PHOSPHATE 10 MG/ML IJ SOLN: 10.0000 mg | Freq: Once | INTRAMUSCULAR | Status: DC

## 2011-08-17 MED ORDER — VANCOMYCIN HCL IN DEXTROSE 1-5 GM/200ML-% IV SOLN
1000.0000 mg | Freq: Once | INTRAVENOUS | Status: AC
  Administered 2011-08-17: 1000 mg via INTRAVENOUS
  Filled 2011-08-17: qty 200

## 2011-08-17 MED ORDER — LORAZEPAM 2 MG/ML IJ SOLN
INTRAMUSCULAR | Status: AC
  Filled 2011-08-17: qty 1

## 2011-08-17 MED ORDER — OXYCODONE HCL 5 MG PO TABS: 5.0000 mg | ORAL_TABLET | ORAL | Status: DC | PRN

## 2011-08-17 MED ORDER — SODIUM CHLORIDE 0.9 % IV SOLN: 16.0000 mg | Freq: Once | INTRAVENOUS | Status: DC

## 2011-08-17 MED ORDER — LORAZEPAM 2 MG/ML IJ SOLN
1.0000 mg | Freq: Once | INTRAMUSCULAR | Status: AC
  Administered 2011-08-17: 1 mg via INTRAVENOUS

## 2011-08-17 MED ORDER — VANCOMYCIN HCL IN DEXTROSE 1-5 GM/200ML-% IV SOLN
1000.0000 mg | Freq: Two times a day (BID) | INTRAVENOUS | Status: DC
  Administered 2011-08-18 – 2011-08-19 (×3): 1000 mg via INTRAVENOUS
  Filled 2011-08-17 (×5): qty 200

## 2011-08-17 MED ORDER — PIPERACILLIN-TAZOBACTAM 3.375 G IVPB
3.3750 g | Freq: Once | INTRAVENOUS | Status: AC
  Administered 2011-08-17: 3.375 g via INTRAVENOUS
  Filled 2011-08-17: qty 50

## 2011-08-17 MED ORDER — ACETAMINOPHEN 650 MG RE SUPP: 650.0000 mg | Freq: Four times a day (QID) | RECTAL | Status: DC | PRN

## 2011-08-17 MED ORDER — MORPHINE SULFATE 2 MG/ML IJ SOLN: 2.0000 mg | INTRAMUSCULAR | Status: DC | PRN

## 2011-08-17 MED ORDER — GI COCKTAIL ~~LOC~~
30.0000 mL | Freq: Three times a day (TID) | ORAL | Status: DC | PRN
  Administered 2011-08-19: 30 mL via ORAL
  Filled 2011-08-17 (×4): qty 30

## 2011-08-17 MED ORDER — FIRST-DUKES MOUTHWASH MT SUSP: 5.0000 mL | Freq: Four times a day (QID) | OROMUCOSAL | Status: DC | PRN

## 2011-08-17 MED ORDER — ONDANSETRON HCL 4 MG/2ML IJ SOLN: 4.0000 mg | Freq: Four times a day (QID) | INTRAMUSCULAR | Status: DC | PRN

## 2011-08-17 MED ORDER — SODIUM CHLORIDE 0.9 % IV SOLN: INTRAVENOUS | Status: DC

## 2011-08-17 MED ORDER — POTASSIUM CHLORIDE IN NACL 20-0.9 MEQ/L-% IV SOLN
INTRAVENOUS | Status: DC
  Administered 2011-08-17: 18:00:00 via INTRAVENOUS
  Administered 2011-08-18: 1000 mL via INTRAVENOUS
  Administered 2011-08-18 – 2011-08-19 (×3): via INTRAVENOUS

## 2011-08-17 MED ORDER — ONDANSETRON HCL 4 MG PO TABS: 4.0000 mg | ORAL_TABLET | Freq: Four times a day (QID) | ORAL | Status: DC | PRN

## 2011-08-17 MED ORDER — HEPARIN SOD (PORK) LOCK FLUSH 100 UNIT/ML IV SOLN
500.0000 [IU] | Freq: Once | INTRAVENOUS | Status: DC
  Filled 2011-08-17: qty 5

## 2011-08-17 MED ORDER — VANCOMYCIN HCL IN DEXTROSE 1-5 GM/200ML-% IV SOLN
INTRAVENOUS | Status: AC
  Filled 2011-08-17: qty 200

## 2011-08-17 MED ORDER — PIPERACILLIN-TAZOBACTAM 3.375 G IVPB
INTRAVENOUS | Status: AC
  Filled 2011-08-17: qty 100

## 2011-08-17 NOTE — Telephone Encounter (Signed)
Dr. Mariel Sleet - patient has been vomiting some over last couple of days. Vomited 4 times last pm. Is urinating but urine is dark yellow. Not drinking alcohol per daughter-in-law. Was drinking approx 40 oz of water a day but is unable to drink that much at present. Has been taking Ativan for the n/v. So she is either awake and vomiting or asleep.   I told her to go ahead and take a Compazine at 0920 this am and to take a Zofran around 1100 if needed and then Ativan around 1 if needed. She hasn't been staggering her meds and hasn't been taking them too often (other than the Ativan).   Do you want her to come in for labs today or fluids or what do you think?

## 2011-08-17 NOTE — ED Provider Notes (Signed)
History     CSN: [id number]  Arrival date & time 08/17/11  1331   First MD Initiated Contact with Patient 08/17/11 1332      Chief Complaint  Patient presents with  . Fever  . Neutropenia     Patient is a 56 y.o. female presenting with fever. The history is provided by the patient.  Fever Primary symptoms of the febrile illness include fever, fatigue, headaches, cough, abdominal pain, nausea, vomiting and diarrhea. Primary symptoms do not include rash. The current episode started today. This is a new problem. The problem has been gradually worsening.  Risk factors for febrile illness include new medication. pt presents for fever, vomiting, diarrhea.  She reports diarrhea several days ago, then started to have vomiting.  She also reports mild abdominal discomfort.  She reports a mild cough.  No rash.  Mild headache is reported.    She just recently started chemo for breast CA She was brought down from oncology clinic for concern for neutropenic fever  Past Medical History  Diagnosis Date  . Hypertension   . Cancer   . Borderline hyperlipidemia     managed with diet and exercise    Past Surgical History  Procedure Date  . Tubal ligation 1980  . Mastectomy, partial 06/04/2011    Procedure: MASTECTOMY PARTIAL;  Surgeon: Fabio Bering, MD;  Location: [facility];  Service: General;  Laterality: Right;  . Axillary lymph node dissection 06/04/2011    Procedure: AXILLARY LYMPH NODE DISSECTION;  Surgeon: Fabio Bering, MD;  Location: [facility];  Service: General;  Laterality: Right;  . Portacath placement 07/23/2011    Procedure: INSERTION PORT-A-CATH;  Surgeon: Fabio Bering, MD;  Location: [facility];  Service: General;  Laterality: Left;  Left Subclavian    Family History  Problem Relation Age of Onset  . Heart disease Mother   . Diabetes Mother   . Heart disease Father   . Diabetes Father   . Cancer Sister     breast cancer  . Anesthesia problems Neg Hx   . Malignant hyperthermia  Neg Hx   . Pseudochol deficiency Neg Hx     History  Substance Use Topics  . Smoking status: Current Everyday Smoker -- 0.5 packs/day for 40 years    Types: Cigarettes  . Smokeless tobacco: Never Used  . Alcohol Use: 16.8 oz/week    28 Glasses of wine per week     3-4 glasses of wine a night    OB History    Grav Para Term Preterm Abortions TAB SAB Ect Mult Living                  Review of Systems  Constitutional: Positive for fever and fatigue.  Respiratory: Positive for cough.   Cardiovascular: Negative for chest pain.  Gastrointestinal: Positive for nausea, vomiting, abdominal pain and diarrhea.  Skin: Negative for rash.  Neurological: Positive for headaches.  All other systems reviewed and are negative.    Allergies  Bactrim and Cephalexin  Home Medications   Current Outpatient Rx  Name Route Sig Dispense Refill  . CETIRIZINE HCL 10 MG PO TABS Oral Take 10 mg by mouth daily as needed. For allergies    . CYCLOPHOSPHAMIDE IV Intravenous Inject into the vein every 21 ( twenty-one) days. To start June 27th x 4 cycles    . DEXAMETHASONE 4 MG PO TABS Oral Take 8 mg by mouth. Starting the day after your [facility] treatment, take 2 tablets (  8mg  total) in the am with food. Then for the next 2 days, take 2 tablets in the am and 2 tablets in the pm. This will be to help decrease nausea & vomiting. Take it whether you think you need it or not.   For Taxotere treatment: the day before, day of, and day after taxotere take 2 tablets in the am and 2 tablets in the pm. This will be to decrease the risk of having fluid retention from the taxotere. Take it whether you think you need it or not.    . ADRIAMYCIN IV Intravenous Inject into the vein every 21 ( twenty-one) days. To start June 27th x 4 cycles    . IBUPROFEN 200 MG PO TABS Oral Take 400 mg by mouth every 6 (six) hours as needed. For pain    . LIDOCAINE-PRILOCAINE 2.5-2.5 % EX KIT Topical Apply topically as needed. Apply a quarter  sized amount to port site 1 hour prior to chemo. Do not rub in. Cover with plastic.    Marland Kitchen LISINOPRIL-HYDROCHLOROTHIAZIDE 20-12.5 MG PO TABS Oral Take 2 tablets by mouth daily.    Marland Kitchen LORAZEPAM 1 MG PO TABS Oral Take 1 tablet (1 mg total) by mouth every 4 (four) hours as needed (Nausea or vomiting). 30 tablet 2  . METOPROLOL TARTRATE 50 MG PO TABS Oral Take 50 mg by mouth every morning.    Marland Kitchen ONDANSETRON HCL 8 MG PO TABS Oral Take 8 mg by mouth 2 (two) times daily as needed. While taking your [facility] treatment: Take 1 tablet two times a day as needed for nausea or vomiting starting on the third day after chemotherapy.  When taking your Taxotere treatment: starting the day after chemo take 1 tablet in the am and 1 tablet in the pm for 3 days. Then may take 1 tablet twice a day if needed for nausea/vomiting.    Marland Kitchen PEGFILGRASTIM INJECTION 6 MG/0.6ML Subcutaneous Inject 6 mg into the skin every 21 ( twenty-one) days. To be given 20-24 hours after chemo    . PROCHLORPERAZINE 25 MG RE SUPP Rectal Place 1 suppository (25 mg total) rectally every 6 (six) hours as needed for nausea. 12 suppository 2  . FIRST-DUKES MOUTHWASH MT SUSP Mouth/Throat Use as directed 5 mLs in the mouth or throat 4 (four) times daily as needed. 240 mL 0  . DOCETAXEL 20 MG/0.5ML IV CONC Intravenous Inject into the vein every 21 ( twenty-one) days. To start in September after the completion of adriamycin and cytoxan. Will take this for 4 cycles.      BP 92/50  Pulse 88  Temp 99.8 F (37.7 C) (Oral)  Resp 16  SpO2 92%   Physical Exam CONSTITUTIONAL: Well developed/well nourished HEAD AND FACE: Normocephalic/atraumatic EYES: EOMI/PERRL ENMT: Mucous membranes dry NECK: supple no meningeal signs SPINE:entire spine nontender CV: S1/S2 noted, no murmurs/rubs/gallops noted Chest - portacath noted to left chest, no overlying tenderness LUNGS: Lungs are clear to auscultation bilaterally, no apparent distress ABDOMEN: soft, nontender, no  rebound or guarding GU:no cva tenderness NEURO: Pt is awake/alert, moves all extremitiesx4 EXTREMITIES: pulses normal, full ROM SKIN: warm, color normal PSYCH: no abnormalities of mood noted   ED Course  Procedures    Labs Reviewed  URINALYSIS, ROUTINE W REFLEX MICROSCOPIC  LACTIC ACID, PLASMA  CULTURE, BLOOD (ROUTINE X 2)  CULTURE, BLOOD (ROUTINE X 2)  URINE CULTURE   Dg Chest Port 1 View  08/17/2011  *RADIOLOGY REPORT*  Clinical Data: Fever.  Vomiting.  Breast cancer.  Undergoing chemotherapy.  PORTABLE CHEST - 1 VIEW  Comparison: 07/23/2011.  Findings: The left subclavian porta catheter is unchanged.  The cardiac silhouette remains mildly enlarged and the right lung is clear.  Interval linear density at the left lung base.  Stable right axillary surgical clips.  Unremarkable bones.  IMPRESSION:  1.  Interval mild left basilar atelectasis. 2.  Stable cardiomegaly.  Original Report Authenticated By: Darrol Angel, M.D.   2:37 PM I spoke to dr Mariel Sleet, oncologist about this patient, she had recent initiation for chemo, now febrile/neutropenic He recommends broad spectrum abx.  Blood/urine cultures sent.  CXR reviewed.  Her abdomen is soft and no focal tenderness.  No signs of meningitis.  She is nontoxic in appearance.  IV fluids ordered   D/w hospitalist dr Kerry Hough he will admit to stepdown   MDM  Nursing notes including past medical history and social history reviewed and considered in documentation Previous records reviewed and considered - oncology clinic note and previous labs reviewed xrays reviewed and considered labs/vitals reviewed and considered         Joya Gaskins, MD 08/17/11 1456

## 2011-08-17 NOTE — Telephone Encounter (Signed)
Erroneous encounter

## 2011-08-17 NOTE — Progress Notes (Unsigned)
Pt reports nausea and vomiting and chills with aching over past few days. Not eating but has been drinking water. Here today for fluids/antiemetics. States she is taking zofran and lorazepam at home as instructed.  CRITICAL VALUE ALERT Critical value received:  Wbc=0.5 Date of notification:  08/17/2011 Time of notification: 1300 Critical value read back:  yes Nurse who received alert:  TAR MD notified (1st page):  Dr. Mariel Sleet notified verbally.  Pt to be admitted due to neutropenia. Family notified and pt transported to ED for admission.

## 2011-08-17 NOTE — Progress Notes (Signed)
Problem #1 stage II cancer the right breast with 3 of 12 positive nodes and a 3.2 cm primary that's intermediate grade with extranodal extension to the lymph nodes and LV I. Her cancer was ER positive PR positive HER-2/neu negative Ki-67 her height 33% status post her first cycle of chemotherapy on 08/09/2011. Your receptors were 89% PR receptors 50%.  Problem #2 neutropenic fever accompanied by nausea vomiting and abdominal discomfort for the last 48 hours. She is here today as a work in because of the above symptoms including nausea vomiting low blood pressure etc. Her white count just came back in his 500. Platelets are mildly low but adequate hemoglobin is fine. She has clear lung fields on exam warm dry skin no rales on her lung exam whatsoever. There no wheezes or rubs. Her heart shows a regular rhythm and rate right around 100. She is sleepy from the anti-and metastatic medication we gave her upon arrival.  Her abdomen is soft L. sounds are diminished she has no organomegaly no ascites and no distention. Her Port-A-Cath is intact.  I think she needs to be admitted to the hospital for a few days of observation and antibiotic therapy. She will be transferred to the emergency room but I talked to the emergency room physician and hospitalists who will be taking care of her.

## 2011-08-17 NOTE — H&P (Addendum)
PCP:   [name], MD   Chief Complaint:  Vomiting, abd pain  HPI: This is a 56 year old female with a history of breast cancer and was recently started on chemotherapy approximately a week ago. She had presented to the cancer Center today for followup she was noted to be weak, febrile, neutropenic. She was sent to the ER for evaluation. She reports onset of vomiting and abdominal pain approximately 3 days ago. She also describes an epigastric pain which is worse every time she eats. Patient reports throwing up numerous times. She denies any hematemesis. He reports epigastric pain which is nonradiating. She reports intermittent loose stools, small quantity. Denies any shortness of breath or wheezing. She does report having a mild cough. Denies any neck pain. No headache. She feels generally weak. In the ER she was noted to be febrile with temperature 102F and blood pressure were on the lower side with a systolic blood pressure in the 90s. The patient was referred for admission  Allergies:   Allergies  Allergen Reactions  . Bactrim (Sulfamethoxazole-Tmp Ds) Nausea Only and Other (See Comments)    Fever and chills along with the nausea.  . Cephalexin Palpitations    Reaction:Heart flutter      Past Medical History  Diagnosis Date  . Hypertension   . Cancer   . Borderline hyperlipidemia     managed with diet and exercise    Past Surgical History  Procedure Date  . Tubal ligation 1980  . Mastectomy, partial 06/04/2011    Procedure: MASTECTOMY PARTIAL;  Surgeon: Fabio Bering, MD;  Location: [facility];  Service: General;  Laterality: Right;  . Axillary lymph node dissection 06/04/2011    Procedure: AXILLARY LYMPH NODE DISSECTION;  Surgeon: Fabio Bering, MD;  Location: [facility];  Service: General;  Laterality: Right;  . Portacath placement 07/23/2011    Procedure: INSERTION PORT-A-CATH;  Surgeon: Fabio Bering, MD;  Location: [facility];  Service: General;  Laterality: Left;  Left  Subclavian    Prior to Admission medications   Medication Sig Start Date End Date Taking? Authorizing Provider  cetirizine (ZYRTEC) 10 MG tablet Take 10 mg by mouth daily as needed. For allergies   Yes Historical Provider, MD  CYCLOPHOSPHAMIDE IV Inject into the vein every 21 ( twenty-one) days. To start June 27th x 4 cycles   Yes Historical Provider, MD  dexamethasone (DECADRON) 4 MG tablet Take 8 mg by mouth. Starting the day after your [facility] treatment, take 2 tablets (8mg  total) in the am with food. Then for the next 2 days, take 2 tablets in the am and 2 tablets in the pm. This will be to help decrease nausea & vomiting. Take it whether you think you need it or not.   For Taxotere treatment: the day before, day of, and day after taxotere take 2 tablets in the am and 2 tablets in the pm. This will be to decrease the risk of having fluid retention from the taxotere. Take it whether you think you need it or not.   Yes Historical Provider, MD  DOXOrubicin HCl (ADRIAMYCIN IV) Inject into the vein every 21 ( twenty-one) days. To start June 27th x 4 cycles   Yes Historical Provider, MD  ibuprofen (ADVIL,MOTRIN) 200 MG tablet Take 400 mg by mouth every 6 (six) hours as needed. For pain   Yes Historical Provider, MD  lidocaine-prilocaine (EMLA) cream Apply topically as needed. Apply a quarter sized amount to port site 1 hour prior  to chemo. Do not rub in. Cover with plastic.   Yes Historical Provider, MD  lisinopril-hydrochlorothiazide (PRINZIDE,ZESTORETIC) 20-12.5 MG per tablet Take 2 tablets by mouth daily.   Yes Historical Provider, MD  LORazepam (ATIVAN) 1 MG tablet Take 1 tablet (1 mg total) by mouth every 4 (four) hours as needed (Nausea or vomiting). 08/06/11 02/02/12 Yes Randall An, MD  metoprolol (LOPRESSOR) 50 MG tablet Take 50 mg by mouth every morning.   Yes Historical Provider, MD  ondansetron (ZOFRAN) 8 MG tablet Take 8 mg by mouth 2 (two) times daily as needed. While taking your [facility]  treatment: Take 1 tablet two times a day as needed for nausea or vomiting starting on the third day after chemotherapy.  When taking your Taxotere treatment: starting the day after chemo take 1 tablet in the am and 1 tablet in the pm for 3 days. Then may take 1 tablet twice a day if needed for nausea/vomiting. 08/06/11 08/05/12 Yes Randall An, MD  pegfilgrastim (NEULASTA) 6 MG/0.6ML injection Inject 6 mg into the skin every 21 ( twenty-one) days. To be given 20-24 hours after chemo   Yes Historical Provider, MD  prochlorperazine (COMPAZINE) 25 MG suppository Place 1 suppository (25 mg total) rectally every 6 (six) hours as needed for nausea. 08/06/11 08/05/12 Yes Randall An, MD  Diphenhyd-Hydrocort-Nystatin (FIRST-DUKES MOUTHWASH) SUSP Use as directed 5 mLs in the mouth or throat 4 (four) times daily as needed. 08/14/11   Ellouise Newer, PA  docetaxel (TAXOTERE) 20 MG/0.5ML injection Inject into the vein every 21 ( twenty-one) days. To start in September after the completion of adriamycin and cytoxan. Will take this for 4 cycles.    Historical Provider, MD    Social History:  reports that she has been smoking Cigarettes.  She has a 20 pack-year smoking history. She has never used smokeless tobacco. She reports that she drinks about 16.8 ounces of alcohol per week. She reports that she does not use illicit drugs.  Family History  Problem Relation Age of Onset  . Heart disease Mother   . Diabetes Mother   . Heart disease Father   . Diabetes Father   . Cancer Sister     breast cancer  . Anesthesia problems Neg Hx   . Malignant hyperthermia Neg Hx   . Pseudochol deficiency Neg Hx     Review of Systems: Positives in bold Constitutional: Denies fever, chills, diaphoresis, appetite change and fatigue.  HEENT: Denies photophobia, eye pain, redness, hearing loss, ear pain, congestion, sore throat, rhinorrhea, sneezing, mouth sores, trouble swallowing, neck pain, neck stiffness and tinnitus.    Respiratory: Denies SOB, DOE, cough, chest tightness,  and wheezing.   Cardiovascular: Denies chest pain, palpitations and leg swelling.  Gastrointestinal: Denies nausea, vomiting, abdominal pain, diarrhea, constipation, blood in stool and abdominal distention.  Genitourinary: Denies dysuria, urgency, frequency, hematuria, flank pain and difficulty urinating.  Musculoskeletal: Denies myalgias, back pain, joint swelling, arthralgias and gait problem.  Skin: Denies pallor, rash and wound.  Neurological: Denies dizziness, seizures, syncope, weakness, light-headedness, numbness and headaches.  Hematological: Denies adenopathy. Easy bruising, personal or family bleeding history  Psychiatric/Behavioral: Denies suicidal ideation, mood changes, confusion, nervousness, sleep disturbance and agitation   Physical Exam: Blood pressure 97/57, pulse 77, temperature 98.6 F (37 C), temperature source Oral, resp. rate 15, height 5\' 3"  (1.6 m), weight 80.6 kg (177 lb 11.1 oz), SpO2 93.00%. General: Patient is lying in bed, she appears to be acutely oh. She  is awake and alert and able to participate in history HEENT: Normocephalic, atraumatic, pupils are equal round reactive to light, mucous membranes are dry Neck: Supple, no meningismus Chest: Clear to auscultation bilaterally, Port-A-Cath noted in the left chest, no overlying tenderness Cardiac: S1, S2, regular rate and rhythm Abdomen: Soft, nontender, nondistended, bowel sounds are active Extremities: No cyanosis, clubbing, edema Neurologic: Grossly intact, nonfocal Skin: No visible rashes, warm and intact  Labs on Admission:  Results for orders placed during the hospital encounter of 08/17/11 (from the past 48 hour(s))  URINALYSIS, ROUTINE W REFLEX MICROSCOPIC     Status: Normal   Collection Time   08/17/11  1:58 PM      Component Value Range Comment   Color, Urine YELLOW  YELLOW    APPearance CLEAR  CLEAR    Specific Gravity, Urine 1.010  1.005 -  1.030    pH 6.5  5.0 - 8.0    Glucose, UA NEGATIVE  NEGATIVE mg/dL    Hgb urine dipstick NEGATIVE  NEGATIVE    Bilirubin Urine NEGATIVE  NEGATIVE    Ketones, ur NEGATIVE  NEGATIVE mg/dL    Protein, ur NEGATIVE  NEGATIVE mg/dL    Urobilinogen, UA 0.2  0.0 - 1.0 mg/dL    Nitrite NEGATIVE  NEGATIVE    Leukocytes, UA NEGATIVE  NEGATIVE MICROSCOPIC NOT DONE ON URINES WITH NEGATIVE PROTEIN, BLOOD, LEUKOCYTES, NITRITE, OR GLUCOSE <1000 mg/dL.  LACTIC ACID, PLASMA     Status: Normal   Collection Time   08/17/11  2:05 PM      Component Value Range Comment   Lactic Acid, Venous 0.9  0.5 - 2.2 mmol/L   CULTURE, BLOOD (ROUTINE X 2)     Status: Normal (Preliminary result)   Collection Time   08/17/11  2:12 PM      Component Value Range Comment   Specimen Description Blood LEFT HAND      Special Requests BOTTLES DRAWN AEROBIC AND ANAEROBIC 6 CC EACH      Culture PENDING      Report Status PENDING       Radiological Exams on Admission: Dg Chest Port 1 View  08/17/2011  *RADIOLOGY REPORT*  Clinical Data: Fever.  Vomiting.  Breast cancer.  Undergoing chemotherapy.  PORTABLE CHEST - 1 VIEW  Comparison: 07/23/2011.  Findings: The left subclavian porta catheter is unchanged.  The cardiac silhouette remains mildly enlarged and the right lung is clear.  Interval linear density at the left lung base.  Stable right axillary surgical clips.  Unremarkable bones.  IMPRESSION:  1.  Interval mild left basilar atelectasis. 2.  Stable cardiomegaly.  Original Report Authenticated By: Darrol Angel, M.D.    Assessment/Plan Principal Problem:  *Neutropenic fever Active Problems:  Dehydration  Breast cancer  Vomiting  Hypertension  SIRS (systemic inflammatory response syndrome)  Plan:  #1 neutropenic fever. Patient will be placed on broad-spectrum antibiotics with vancomycin and Zosyn. She is already had blood cultures drawn, as well as urine culture. Her chest x-ray shows no acute process. It is possible that  her symptoms are secondary to her recent chemotherapy. We will rule out any underlying infection. Patient has recently received Neulasta. I discussed the case with Dr. Mariel Sleet. Neupogen at this time was not indicated unless the patient's clinical condition deteriorates. We will check serial CBCs to monitor her white blood cell count. She'll be placed on neutropenic precautions.  #2. Vomiting. Patient was placed on clear liquids for now. We will check a lipase.  Again, this may be secondary to her underlying chemotherapy. May also be a gastritis. We'll start her on proton pump inhibitors as well as GI cocktail when necessary.  #3. SIRS. Patient will be given IV fluids and antibiotics. We will rule out an underlying process. Her lactic acid is normal.  #4. Hypertension. Patient has a history of hypertension but is currently hypotensive. We will hold her antihypertensives for now.  #5. Breast cancer. Patient will follow up with the cancer Center once she is medically improved.  #6. DVT prophylaxis. Patient will be placed on SCDs since she is already thrombocytopenic and we anticipate her platelets to further drop since she recently received chemotherapy   Further orders per the clinical course  Time Spent on Admission: Critical care:  Abishai Viegas [facility] Pager: [phone] 08/17/2011, 5:16 PM

## 2011-08-17 NOTE — ED Notes (Signed)
Danielle [name] was brought to the ER from the [facility] - patient had reported fever, weakness, nausea x 3 days and per Dr. Mariel Sleet is neutropenic.  Dr. Mariel Sleet spoke with Drs. Lendell Caprice and [place] regarding the patient.  EDP to see.  Port-a-cath was accessed while in oncology and pt rc'd zofran/decadron while in CC.

## 2011-08-17 NOTE — Progress Notes (Signed)
ANTIBIOTIC CONSULT NOTE - INITIAL  Pharmacy Consult for Vancomycin and Zosyn  Indication: Neutropenic Fever/SIRS  Allergies  Allergen Reactions  . Bactrim (Sulfamethoxazole-Tmp Ds) Nausea Only and Other (See Comments)    Fever and chills along with the nausea.  . Cephalexin Palpitations    Reaction:Heart flutter    Patient Measurements: Height: 5\' 3"  (160 cm) Weight: 177 lb 11.1 oz (80.6 kg) IBW/kg (Calculated) : 52.4    Vital Signs: Temp: 98.6 F (37 C) (07/05 1631) Temp src: Oral (07/05 1631) BP: 107/65 mmHg (07/05 1700) Pulse Rate: 81  (07/05 1700) Intake/Output from previous day:   Intake/Output from this shift:    Labs:  Basename 08/17/11 1059  WBC 0.5*  HGB 12.3  PLT 117*  LABCREA --  CREATININE 1.02   Estimated Creatinine Clearance: 61.2 ml/min (by C-G formula based on Cr of 1.02). No results found for this basename: VANCOTROUGH:2,VANCOPEAK:2,VANCORANDOM:2,GENTTROUGH:2,GENTPEAK:2,GENTRANDOM:2,TOBRATROUGH:2,TOBRAPEAK:2,TOBRARND:2,AMIKACINPEAK:2,AMIKACINTROU:2,AMIKACIN:2, in the last 72 hours   Microbiology: Recent Results (from the past 720 hour(s))  CULTURE, BLOOD (ROUTINE X 2)     Status: Normal (Preliminary result)   Collection Time   08/17/11  2:12 PM      Component Value Range Status Comment   Specimen Description Blood LEFT HAND   Final    Special Requests BOTTLES DRAWN AEROBIC AND ANAEROBIC 6 CC EACH   Final    Culture PENDING   Incomplete    Report Status PENDING   Incomplete     Medical History: Past Medical History  Diagnosis Date  . Hypertension   . Cancer   . Borderline hyperlipidemia     managed with diet and exercise    Medications: Prescriptions prior to admission  Medication Sig Dispense Refill  . cetirizine (ZYRTEC) 10 MG tablet Take 10 mg by mouth daily as needed. For allergies      . CYCLOPHOSPHAMIDE IV Inject into the vein every 21 ( twenty-one) days. To start June 27th x 4 cycles      . dexamethasone (DECADRON) 4 MG tablet  Take 8 mg by mouth. Starting the day after your [facility] treatment, take 2 tablets (8mg  total) in the am with food. Then for the next 2 days, take 2 tablets in the am and 2 tablets in the pm. This will be to help decrease nausea & vomiting. Take it whether you think you need it or not.   For Taxotere treatment: the day before, day of, and day after taxotere take 2 tablets in the am and 2 tablets in the pm. This will be to decrease the risk of having fluid retention from the taxotere. Take it whether you think you need it or not.      Marland Kitchen DOXOrubicin HCl (ADRIAMYCIN IV) Inject into the vein every 21 ( twenty-one) days. To start June 27th x 4 cycles      . ibuprofen (ADVIL,MOTRIN) 200 MG tablet Take 400 mg by mouth every 6 (six) hours as needed. For pain      . lidocaine-prilocaine (EMLA) cream Apply topically as needed. Apply a quarter sized amount to port site 1 hour prior to chemo. Do not rub in. Cover with plastic.      Marland Kitchen lisinopril-hydrochlorothiazide (PRINZIDE,ZESTORETIC) 20-12.5 MG per tablet Take 2 tablets by mouth daily.      Marland Kitchen LORazepam (ATIVAN) 1 MG tablet Take 1 tablet (1 mg total) by mouth every 4 (four) hours as needed (Nausea or vomiting).  30 tablet  2  . metoprolol (LOPRESSOR) 50 MG tablet Take 50 mg by  mouth every morning.      . ondansetron (ZOFRAN) 8 MG tablet Take 8 mg by mouth 2 (two) times daily as needed. While taking your [facility] treatment: Take 1 tablet two times a day as needed for nausea or vomiting starting on the third day after chemotherapy.  When taking your Taxotere treatment: starting the day after chemo take 1 tablet in the am and 1 tablet in the pm for 3 days. Then may take 1 tablet twice a day if needed for nausea/vomiting.      . pegfilgrastim (NEULASTA) 6 MG/0.6ML injection Inject 6 mg into the skin every 21 ( twenty-one) days. To be given 20-24 hours after chemo      . prochlorperazine (COMPAZINE) 25 MG suppository Place 1 suppository (25 mg total) rectally every 6 (six) hours  as needed for nausea.  12 suppository  2  . Diphenhyd-Hydrocort-Nystatin (FIRST-DUKES MOUTHWASH) SUSP Use as directed 5 mLs in the mouth or throat 4 (four) times daily as needed.  240 mL  0  . docetaxel (TAXOTERE) 20 MG/0.5ML injection Inject into the vein every 21 ( twenty-one) days. To start in September after the completion of adriamycin and cytoxan. Will take this for 4 cycles.        Assessment: Okay for Protocol Received initial doses in ER at [facility] this afternoon.  Goal of Therapy:  Eradicate infection. Vancomycin Trough level = 15-20.  Plan:  Zosyn 3.375gm IV every 8 hours. Follow-up micro data, labs, vitals. Vancomycin 1000mg  IV every 12 hours. Trough at steady state.  Lamonte Richer R 08/17/2011,5:58 PM

## 2011-08-18 ENCOUNTER — Inpatient Hospital Stay (HOSPITAL_COMMUNITY): Payer: Medicaid Other

## 2011-08-18 DIAGNOSIS — C801 Malignant (primary) neoplasm, unspecified: Secondary | ICD-10-CM

## 2011-08-18 DIAGNOSIS — A0472 Enterocolitis due to Clostridium difficile, not specified as recurrent: Secondary | ICD-10-CM | POA: Diagnosis present

## 2011-08-18 LAB — COMPREHENSIVE METABOLIC PANEL
ALT: 46 U/L — ABNORMAL HIGH (ref 0–35)
Albumin: 3.2 g/dL — ABNORMAL LOW (ref 3.5–5.2)
Alkaline Phosphatase: 64 U/L (ref 39–117)
BUN: 19 mg/dL (ref 6–23)
Chloride: 94 mEq/L — ABNORMAL LOW (ref 96–112)
GFR calc Af Amer: 81 mL/min — ABNORMAL LOW (ref >90)
Glucose, Bld: 99 mg/dL (ref 70–99)
Potassium: 3.4 mEq/L — ABNORMAL LOW (ref 3.5–5.1)
Total Bilirubin: 0.4 mg/dL (ref 0.3–1.2)

## 2011-08-18 LAB — URINE CULTURE: Colony Count: 4000

## 2011-08-18 LAB — CBC WITH DIFFERENTIAL/PLATELET
Basophils Absolute: 0 10*3/uL (ref 0.0–0.1)
Basophils Relative: 1 % (ref 0–1)
Eosinophils Relative: 0 % (ref 0–5)
Lymphocytes Relative: 55 % — ABNORMAL HIGH (ref 12–46)
MCHC: 34.6 g/dL (ref 30.0–36.0)
MCV: 94.8 fL (ref 78.0–100.0)
Platelets: 82 10*3/uL — ABNORMAL LOW (ref 150–400)
RDW: 12.3 % (ref 11.5–15.5)
WBC: 0.7 10*3/uL — CL (ref 4.0–10.5)

## 2011-08-18 LAB — CARDIAC PANEL(CRET KIN+CKTOT+MB+TROPI): CK, MB: 0.9 ng/mL (ref 0.3–4.0)

## 2011-08-18 LAB — TSH: TSH: 0.291 u[IU]/mL — ABNORMAL LOW (ref 0.350–4.500)

## 2011-08-18 MED ORDER — METRONIDAZOLE 500 MG PO TABS
500.0000 mg | ORAL_TABLET | Freq: Three times a day (TID) | ORAL | Status: DC
  Administered 2011-08-18 – 2011-08-20 (×8): 500 mg via ORAL
  Filled 2011-08-18 (×8): qty 1

## 2011-08-18 MED ORDER — SACCHAROMYCES BOULARDII 250 MG PO CAPS
250.0000 mg | ORAL_CAPSULE | Freq: Two times a day (BID) | ORAL | Status: DC
  Administered 2011-08-18 – 2011-08-20 (×5): 250 mg via ORAL
  Filled 2011-08-18 (×5): qty 1

## 2011-08-18 MED ORDER — PANTOPRAZOLE SODIUM 40 MG PO TBEC
40.0000 mg | DELAYED_RELEASE_TABLET | Freq: Every day | ORAL | Status: DC
  Administered 2011-08-18 – 2011-08-20 (×3): 40 mg via ORAL
  Filled 2011-08-18 (×3): qty 1

## 2011-08-18 MED ORDER — POTASSIUM CHLORIDE CRYS ER 20 MEQ PO TBCR
40.0000 meq | EXTENDED_RELEASE_TABLET | Freq: Once | ORAL | Status: AC
  Administered 2011-08-18: 40 meq via ORAL

## 2011-08-18 NOTE — Progress Notes (Signed)
CRITICAL VALUE ALERT  Critical value received: positive C-diff  Date of notification:  08/18/11  Time of notification:  0144  Critical value read back:yes  Nurse who received alert:  Marcos Eke, RN  MD notified (1st page):  Rito Ehrlich  Time of first page:  0148  MD notified (2nd page):  Time of second page:  Responding MD:  Rito Ehrlich  Time MD responded:  [phone]

## 2011-08-18 NOTE — Progress Notes (Signed)
The patient is receiving Protonix by the intravenous route.  Based on criteria approved by the Pharmacy and Therapeutics Committee and the Medical Executive Committee, the medication is being converted to the equivalent oral dose form.  These criteria include: -No Active GI bleeding -Able to tolerate diet of full liquids (or better) or tube feeding OR able to tolerate other medications by the oral or enteral route  If you have any questions about this conversion, please contact the Pharmacy Department (ext [phone]).  Thank you.  Mady Gemma, [facility] 08/18/2011 10:55 AM

## 2011-08-18 NOTE — Progress Notes (Signed)
TRANSFER REPORT CALLED TO JESSICA ON 300. PT GOING TO 309. PT ALERT AND ORIENTED. DENIES SOB OR NAUSEA. IV INFUSING W/O DIFFICULTTY. REMAIMS ON ENTERIC PRECAUTIONS FOR C-DIFF.

## 2011-08-18 NOTE — Progress Notes (Signed)
Subjective: Patient reports feeling improved today. Her nausea and vomiting has improved. She is tolerating a diet. She is still having bowel movements, but overall appears to be getting better.  Objective: Vital signs in last 24 hours: Temp:  [97.7 F (36.5 C)-102.1 F (38.9 C)] 97.7 F (36.5 C) (07/06 0400) Pulse Rate:  [65-90] 84  (07/06 0900) Resp:  [12-20] 14  (07/06 0900) BP: (92-139)/(50-111) 96/56 mmHg (07/06 0900) SpO2:  [92 %-93 %] 93 % (07/05 1631) Weight:  [80.6 kg (177 lb 11.1 oz)-84.4 kg (186 lb 1.1 oz)] 84.4 kg (186 lb 1.1 oz) (07/06 0500) Weight change:  Last BM Date: 08/18/11  Intake/Output from previous day: 07/05 0701 - 07/06 0700 In: 2713.3 [P.O.:480; I.V.:1923.3; IV Piggyback:310] Out: -  Total I/O In: 560 [P.O.:360; I.V.:200] Out: -    Physical Exam: General: Alert, awake, oriented x3, in no acute distress. HEENT: No bruits, no goiter. Heart: Regular rate and rhythm, without murmurs, rubs, gallops. Lungs: Clear to auscultation bilaterally. Abdomen: Soft, nontender, nondistended, positive bowel sounds. Extremities: No clubbing cyanosis or edema with positive pedal pulses. Neuro: Grossly intact, nonfocal.    Lab Results: Basic Metabolic Panel:  Basename 08/18/11 0114 08/17/11 1059  NA 130* 129*  K 3.4* 3.6  CL 94* 90*  CO2 24 25  GLUCOSE 99 108*  BUN 19 18  CREATININE 0.90 1.02  CALCIUM 8.6 9.2  MG -- --  PHOS -- --   Liver Function Tests:  Basename 08/18/11 0114 08/17/11 1059  AST 22 20  ALT 46* 50*  ALKPHOS 64 73  BILITOT 0.4 0.8  PROT 6.3 7.0  ALBUMIN 3.2* 3.5    Basename 08/17/11 1738  LIPASE 23  AMYLASE --   No results found for this basename: AMMONIA:2 in the last 72 hours CBC:  Basename 08/18/11 0114 08/17/11 1059  WBC 0.7* 0.5*  NEUTROABS 0.2* TOO FEW TO COUNT, SMEAR AVAILABLE FOR REVIEW  HGB 10.7* 12.3  HCT 30.9* 35.9*  MCV 94.8 94.7  PLT 82* 117*   Cardiac Enzymes:  Basename 08/18/11 0114 08/17/11 1738    CKTOTAL 19 18  CKMB 0.6 0.6  CKMBINDEX -- --  TROPONINI <0.30 <0.30   BNP: No results found for this basename: PROBNP:3 in the last 72 hours D-Dimer: No results found for this basename: DDIMER:2 in the last 72 hours CBG: No results found for this basename: GLUCAP:6 in the last 72 hours Hemoglobin A1C: No results found for this basename: HGBA1C in the last 72 hours Fasting Lipid Panel: No results found for this basename: CHOL,HDL,LDLCALC,TRIG,CHOLHDL,LDLDIRECT in the last 72 hours Thyroid Function Tests:  Basename 08/17/11 1738  TSH 0.291*  T4TOTAL --  FREET4 --  T3FREE --  THYROIDAB --   Anemia Panel: No results found for this basename: VITAMINB12,FOLATE,FERRITIN,TIBC,IRON,RETICCTPCT in the last 72 hours Coagulation: No results found for this basename: LABPROT:2,INR:2 in the last 72 hours Urine Drug Screen: Drugs of Abuse     Component Value Date/Time   LABOPIA NONE DETECTED 09/28/2008 1553   COCAINSCRNUR NONE DETECTED 09/28/2008 1553   LABBENZ NONE DETECTED 09/28/2008 1553   AMPHETMU NONE DETECTED 09/28/2008 1553   THCU NONE DETECTED 09/28/2008 1553   LABBARB  Value: NONE DETECTED        DRUG SCREEN FOR MEDICAL PURPOSES ONLY.  IF CONFIRMATION IS NEEDED FOR ANY PURPOSE, NOTIFY LAB WITHIN 5 DAYS.        LOWEST DETECTABLE LIMITS FOR URINE DRUG SCREEN Drug Class       Cutoff (ng/mL) Amphetamine  1000 Barbiturate      200 Benzodiazepine   200 Tricyclics       300 Opiates          300 Cocaine          300 THC              50 09/28/2008 1553    Alcohol Level: No results found for this basename: ETH:2 in the last 72 hours Urinalysis:  Basename 08/17/11 1358  COLORURINE YELLOW  LABSPEC 1.010  PHURINE 6.5  GLUCOSEU NEGATIVE  HGBUR NEGATIVE  BILIRUBINUR NEGATIVE  KETONESUR NEGATIVE  PROTEINUR NEGATIVE  UROBILINOGEN 0.2  NITRITE NEGATIVE  LEUKOCYTESUR NEGATIVE    Recent Results (from the past 240 hour(s))  CULTURE, BLOOD (ROUTINE X 2)     Status: Normal (Preliminary  result)   Collection Time   08/17/11  2:12 PM      Component Value Range Status Comment   Specimen Description Blood LEFT HAND   Final    Special Requests BOTTLES DRAWN AEROBIC AND ANAEROBIC 6 CC EACH   Final    Culture PENDING   Incomplete    Report Status PENDING   Incomplete   MRSA PCR SCREENING     Status: Normal   Collection Time   08/17/11  4:41 PM      Component Value Range Status Comment   MRSA by PCR NEGATIVE  NEGATIVE Final   CLOSTRIDIUM DIFFICILE BY PCR     Status: Abnormal   Collection Time   08/17/11 10:00 PM      Component Value Range Status Comment   C difficile by pcr POSITIVE (*) NEGATIVE Final     Studies/Results: Portable Chest 1 View  08/18/2011  *RADIOLOGY REPORT*  Clinical Data: Neutropenic fever.  Cough  PORTABLE CHEST - 1 VIEW  Comparison: 08/17/2011  Findings: There is a left chest wall porta-catheter with tip in the SVC.  The heart size appears mildly enlarged.  No pleural effusion or edema.  No airspace consolidation.  Surgical clips noted in the right axilla.  IMPRESSION:  1.  No acute cardiopulmonary abnormalities.  Original Report Authenticated By: Rosealee Albee, M.D.   Dg Chest Port 1 View  08/17/2011  *RADIOLOGY REPORT*  Clinical Data: Fever.  Vomiting.  Breast cancer.  Undergoing chemotherapy.  PORTABLE CHEST - 1 VIEW  Comparison: 07/23/2011.  Findings: The left subclavian porta catheter is unchanged.  The cardiac silhouette remains mildly enlarged and the right lung is clear.  Interval linear density at the left lung base.  Stable right axillary surgical clips.  Unremarkable bones.  IMPRESSION:  1.  Interval mild left basilar atelectasis. 2.  Stable cardiomegaly.  Original Report Authenticated By: Darrol Angel, M.D.    Medications: Scheduled Meds:    . metroNIDAZOLE  500 mg Oral Q8H  . pantoprazole (PROTONIX) IV  40 mg Intravenous Q24H  . piperacillin-tazobactam (ZOSYN)  IV  3.375 g Intravenous Once  . piperacillin-tazobactam (ZOSYN)  IV  3.375 g  Intravenous Q8H  . sodium chloride  1,000 mL Intravenous Once  . sodium chloride  1,000 mL Intravenous Once  . vancomycin  1,000 mg Intravenous Once  . vancomycin  1,000 mg Intravenous Q12H  . DISCONTD: sodium chloride   Intravenous STAT   Continuous Infusions:    . 0.9 % NaCl with KCl 20 mEq / L 100 mL/hr at 08/18/11 0900   PRN Meds:.acetaminophen, acetaminophen, gi cocktail, magic mouthwash, morphine injection, ondansetron (ZOFRAN) IV, ondansetron, oxyCODONE, DISCONTD: FIRST-DUKES MOUTHWASH  Assessment/Plan:  Principal Problem:  *Neutropenic fever Active Problems:  Dehydration  Breast cancer  Vomiting  Hypertension  SIRS (systemic inflammatory response syndrome)  C. difficile diarrhea  Plan:  #1. Neutropenic fever. Likely related to recent chemotherapy and underlying infection. Patient had received Neulasta last week. We will monitor for recovery of her white blood cell count. She is on empiric antibiotics. We'll followup cultures. If blood cultures remain negative by tomorrow, I think we can discontinue her vancomycin and Zosyn.  #2. Clostridium difficile diarrhea. Patient was started on oral Flagyl. We will also start probiotics. She reports recently receiving a course of antibiotics for mastitis. This was within the past 2 months. She appears to be tolerating a diet. Her nausea and vomiting has improved.  #3. Dehydration. Improving with IV fluids.  #4. Thrombocytopenia. Likely related to recent chemotherapy. We'll continue to monitor. She does not have any signs of bleeding.  #5. Disposition. Patient will be transferred to a telemetry floor today.   LOS: 1 day   Danielle [name] [facility] Pager: [phone] 08/18/2011, 9:33 AM

## 2011-08-18 NOTE — Progress Notes (Signed)
CRITICAL VALUE ALERT  Critical value received:  WBC-0.70  Date of notification:08/18/11  Time of notification:  0200  Critical value read back:yes  Nurse who received alert:  Marcos Eke, RN  MD notified (1st page):  Rito Ehrlich  Time of first page:  0207  MD notified (2nd page):  Time of second page:  Responding MD:  Rito Ehrlich  Time MD responded:

## 2011-08-19 LAB — CBC WITH DIFFERENTIAL/PLATELET
Eosinophils Relative: 1 % (ref 0–5)
HCT: 30.7 % — ABNORMAL LOW (ref 36.0–46.0)
Hemoglobin: 10.6 g/dL — ABNORMAL LOW (ref 12.0–15.0)
Lymphocytes Relative: 51 % — ABNORMAL HIGH (ref 12–46)
Lymphs Abs: 0.7 10*3/uL (ref 0.7–4.0)
MCV: 95.3 fL (ref 78.0–100.0)
Monocytes Absolute: 0.1 10*3/uL (ref 0.1–1.0)
Monocytes Relative: 10 % (ref 3–12)
Neutro Abs: 0.5 10*3/uL — ABNORMAL LOW (ref 1.7–7.7)
RBC: 3.22 MIL/uL — ABNORMAL LOW (ref 3.87–5.11)
WBC: 1.4 10*3/uL — CL (ref 4.0–10.5)

## 2011-08-19 LAB — BASIC METABOLIC PANEL
CO2: 22 mEq/L (ref 19–32)
Calcium: 8.5 mg/dL (ref 8.4–10.5)
Chloride: 100 mEq/L (ref 96–112)
Creatinine, Ser: 0.71 mg/dL (ref 0.50–1.10)
Glucose, Bld: 90 mg/dL (ref 70–99)

## 2011-08-19 LAB — VANCOMYCIN, TROUGH: Vancomycin Tr: 11.3 ug/mL (ref 10.0–20.0)

## 2011-08-19 MED ORDER — VANCOMYCIN HCL 1000 MG IV SOLR
1250.0000 mg | Freq: Two times a day (BID) | INTRAVENOUS | Status: DC
  Administered 2011-08-19 – 2011-08-20 (×2): 1250 mg via INTRAVENOUS
  Filled 2011-08-19 (×4): qty 1250

## 2011-08-19 MED ORDER — GI COCKTAIL ~~LOC~~
ORAL | Status: AC
  Filled 2011-08-19: qty 30

## 2011-08-19 MED ORDER — POTASSIUM CHLORIDE 20 MEQ/15ML (10%) PO LIQD
40.0000 meq | Freq: Once | ORAL | Status: AC
  Administered 2011-08-19: 40 meq via ORAL
  Filled 2011-08-19: qty 30

## 2011-08-19 MED ORDER — ALPRAZOLAM 1 MG PO TABS: 1.0000 mg | ORAL_TABLET | Freq: Every evening | ORAL | Status: DC | PRN

## 2011-08-19 MED ORDER — LORAZEPAM 1 MG PO TABS
1.0000 mg | ORAL_TABLET | Freq: Every evening | ORAL | Status: DC | PRN
  Administered 2011-08-19: 1 mg via ORAL
  Filled 2011-08-19: qty 1

## 2011-08-19 NOTE — Progress Notes (Signed)
ANTIBIOTIC CONSULT NOTE - INITIAL  Pharmacy Consult for Vancomycin and Zosyn  Indication: Neutropenic Fever/SIRS  Allergies  Allergen Reactions  . Bactrim (Sulfamethoxazole-Tmp Ds) Nausea Only and Other (See Comments)    Fever and chills along with the nausea.  . Cephalexin Palpitations    Reaction:Heart flutter    Patient Measurements: Height: 5\' 3"  (160 cm) Weight: 186 lb 1.1 oz (84.4 kg) IBW/kg (Calculated) : 52.4    Vital Signs: Temp: 98.4 F (36.9 C) (07/07 0607) BP: 131/84 mmHg (07/07 0607) Pulse Rate: 74  (07/07 0607) Intake/Output from previous day: 07/06 0701 - 07/07 0700 In: 1440 [P.O.:840; I.V.:600] Out: -  Intake/Output from this shift:    Labs:  Basename 08/19/11 0310 08/18/11 0114 08/17/11 1059  WBC 1.4* 0.7* 0.5*  HGB 10.6* 10.7* 12.3  PLT 68* 82* 117*  LABCREA -- -- --  CREATININE 0.71 0.90 1.02   Estimated Creatinine Clearance: 79.9 ml/min (by C-G formula based on Cr of 0.71).  Basename 08/19/11 0310  VANCOTROUGH 11.3  VANCOPEAK --  VANCORANDOM --  GENTTROUGH --  GENTPEAK --  GENTRANDOM --  TOBRATROUGH --  TOBRAPEAK --  TOBRARND --  AMIKACINPEAK --  AMIKACINTROU --  AMIKACIN --     Microbiology: Recent Results (from the past 720 hour(s))  URINE CULTURE     Status: Normal   Collection Time   08/17/11  1:58 PM      Component Value Range Status Comment   Specimen Description URINE, CLEAN CATCH   Final    Special Requests NONE   Final    Culture  Setup Time 08/18/2011 01:40   Final    Colony Count 4,000 COLONIES/ML   Final    Culture INSIGNIFICANT GROWTH   Final    Report Status 08/18/2011 FINAL   Final   CULTURE, BLOOD (ROUTINE X 2)     Status: Normal (Preliminary result)   Collection Time   08/17/11  2:07 PM      Component Value Range Status Comment   Specimen Description Blood   Final    Special Requests NONE   Final    Culture NO GROWTH 1 DAY   Final    Report Status PENDING   Incomplete   CULTURE, BLOOD (ROUTINE X 2)      Status: Normal (Preliminary result)   Collection Time   08/17/11  2:12 PM      Component Value Range Status Comment   Specimen Description Blood LEFT HAND   Final    Special Requests BOTTLES DRAWN AEROBIC AND ANAEROBIC 6 CC EACH   Final    Culture NO GROWTH 1 DAY   Final    Report Status PENDING   Incomplete   MRSA PCR SCREENING     Status: Normal   Collection Time   08/17/11  4:41 PM      Component Value Range Status Comment   MRSA by PCR NEGATIVE  NEGATIVE Final   CLOSTRIDIUM DIFFICILE BY PCR     Status: Abnormal   Collection Time   08/17/11 10:00 PM      Component Value Range Status Comment   C difficile by pcr POSITIVE (*) NEGATIVE Final     Medical History: Past Medical History  Diagnosis Date  . Hypertension   . Cancer   . Borderline hyperlipidemia     managed with diet and exercise    Medications: Prescriptions prior to admission  Medication Sig Dispense Refill  . cetirizine (ZYRTEC) 10 MG tablet Take 10 mg by  mouth daily as needed. For allergies      . CYCLOPHOSPHAMIDE IV Inject into the vein every 21 ( twenty-one) days. To start June 27th x 4 cycles      . dexamethasone (DECADRON) 4 MG tablet Take 8 mg by mouth. Starting the day after your [facility] treatment, take 2 tablets (8mg  total) in the am with food. Then for the next 2 days, take 2 tablets in the am and 2 tablets in the pm. This will be to help decrease nausea & vomiting. Take it whether you think you need it or not.   For Taxotere treatment: the day before, day of, and day after taxotere take 2 tablets in the am and 2 tablets in the pm. This will be to decrease the risk of having fluid retention from the taxotere. Take it whether you think you need it or not.      Marland Kitchen DOXOrubicin HCl (ADRIAMYCIN IV) Inject into the vein every 21 ( twenty-one) days. To start June 27th x 4 cycles      . ibuprofen (ADVIL,MOTRIN) 200 MG tablet Take 400 mg by mouth every 6 (six) hours as needed. For pain      . lidocaine-prilocaine (EMLA) cream  Apply topically as needed. Apply a quarter sized amount to port site 1 hour prior to chemo. Do not rub in. Cover with plastic.      Marland Kitchen lisinopril-hydrochlorothiazide (PRINZIDE,ZESTORETIC) 20-12.5 MG per tablet Take 2 tablets by mouth daily.      Marland Kitchen LORazepam (ATIVAN) 1 MG tablet Take 1 tablet (1 mg total) by mouth every 4 (four) hours as needed (Nausea or vomiting).  30 tablet  2  . metoprolol (LOPRESSOR) 50 MG tablet Take 50 mg by mouth every morning.      . ondansetron (ZOFRAN) 8 MG tablet Take 8 mg by mouth 2 (two) times daily as needed. While taking your [facility] treatment: Take 1 tablet two times a day as needed for nausea or vomiting starting on the third day after chemotherapy.  When taking your Taxotere treatment: starting the day after chemo take 1 tablet in the am and 1 tablet in the pm for 3 days. Then may take 1 tablet twice a day if needed for nausea/vomiting.      . pegfilgrastim (NEULASTA) 6 MG/0.6ML injection Inject 6 mg into the skin every 21 ( twenty-one) days. To be given 20-24 hours after chemo      . prochlorperazine (COMPAZINE) 25 MG suppository Place 1 suppository (25 mg total) rectally every 6 (six) hours as needed for nausea.  12 suppository  2  . Diphenhyd-Hydrocort-Nystatin (FIRST-DUKES MOUTHWASH) SUSP Use as directed 5 mLs in the mouth or throat 4 (four) times daily as needed.  240 mL  0  . docetaxel (TAXOTERE) 20 MG/0.5ML injection Inject into the vein every 21 ( twenty-one) days. To start in September after the completion of adriamycin and cytoxan. Will take this for 4 cycles.        Assessment: Okay for Protocol. Trough below goal. Estimated Creatinine Clearance: 79.9 ml/min (by C-G formula based on Cr of 0.71).   Goal of Therapy:  Eradicate infection. Vancomycin Trough level = 15-20.  Plan:  Zosyn 3.375gm IV every 8 hours. Follow-up micro data, labs, vitals. Increase Vancomycin to 1250mg  IV every 12 hours. Repeat trough in 2-3 days if continues.  Mady Gemma 08/19/2011,8:41 AM

## 2011-08-19 NOTE — Progress Notes (Signed)
Patient's blood pressure was 165/94. Patient is asymptomatic. Doctor was notified and doctor told nurse to monitor.

## 2011-08-19 NOTE — Progress Notes (Signed)
Subjective: Patient is feeling better.  She continues to have significant diarrhea.  She continues to have epigastric discomfort which seems to be worse after eating.  Objective: Vital signs in last 24 hours: Temp:  [97.8 F (36.6 C)-98.4 F (36.9 C)] 98.4 F (36.9 C) (07/07 0607) Pulse Rate:  [70-80] 74  (07/07 0607) Resp:  [13-16] 16  (07/07 0607) BP: (102-131)/(66-84) 131/84 mmHg (07/07 0607) SpO2:  [95 %-98 %] 95 % (07/07 0607) Weight change:  Last BM Date: 08/19/11  Intake/Output from previous day: 07/06 0701 - 07/07 0700 In: 1440 [P.O.:840; I.V.:600] Out: -  Total I/O In: 480 [P.O.:480] Out: -    Physical Exam: General: Alert, awake, oriented x3, in no acute distress. HEENT: No bruits, no goiter. Heart: Regular rate and rhythm, without murmurs, rubs, gallops. Lungs: Clear to auscultation bilaterally. Abdomen: Soft, nontender, nondistended, positive bowel sounds. Extremities: No clubbing cyanosis or edema with positive pedal pulses. Neuro: Grossly intact, nonfocal.    Lab Results: Basic Metabolic Panel:  Basename 08/19/11 0310 08/18/11 0114  NA 131* 130*  K 3.4* 3.4*  CL 100 94*  CO2 22 24  GLUCOSE 90 99  BUN 9 19  CREATININE 0.71 0.90  CALCIUM 8.5 8.6  MG -- --  PHOS -- --   Liver Function Tests:  [facility] 08/18/11 0114 08/17/11 1059  AST 22 20  ALT 46* 50*  ALKPHOS 64 73  BILITOT 0.4 0.8  PROT 6.3 7.0  ALBUMIN 3.2* 3.5    Basename 08/17/11 1738  LIPASE 23  AMYLASE --   No results found for this basename: AMMONIA:2 in the last 72 hours CBC:  Basename 08/19/11 0310 08/18/11 0114  WBC 1.4* 0.7*  NEUTROABS 0.5* 0.2*  HGB 10.6* 10.7*  HCT 30.7* 30.9*  MCV 95.3 94.8  PLT 68* 82*   Cardiac Enzymes:  Basename 08/18/11 0844 08/18/11 0114 08/17/11 1738  CKTOTAL 22 19 18   CKMB 0.9 0.6 0.6  CKMBINDEX -- -- --  TROPONINI <0.30 <0.30 <0.30   BNP: No results found for this basename: PROBNP:3 in the last 72 hours D-Dimer: No results found  for this basename: DDIMER:2 in the last 72 hours CBG: No results found for this basename: GLUCAP:6 in the last 72 hours Hemoglobin A1C: No results found for this basename: HGBA1C in the last 72 hours Fasting Lipid Panel: No results found for this basename: CHOL,HDL,LDLCALC,TRIG,CHOLHDL,LDLDIRECT in the last 72 hours Thyroid Function Tests:  Basename 08/17/11 1738  TSH 0.291*  T4TOTAL --  FREET4 --  T3FREE --  THYROIDAB --   Anemia Panel: No results found for this basename: VITAMINB12,FOLATE,FERRITIN,TIBC,IRON,RETICCTPCT in the last 72 hours Coagulation: No results found for this basename: LABPROT:2,INR:2 in the last 72 hours Urine Drug Screen: Drugs of Abuse     Component Value Date/Time   LABOPIA NONE DETECTED 09/28/2008 1553   COCAINSCRNUR NONE DETECTED 09/28/2008 1553   LABBENZ NONE DETECTED 09/28/2008 1553   AMPHETMU NONE DETECTED 09/28/2008 1553   THCU NONE DETECTED 09/28/2008 1553   LABBARB  Value: NONE DETECTED        DRUG SCREEN FOR MEDICAL PURPOSES ONLY.  IF CONFIRMATION IS NEEDED FOR ANY PURPOSE, NOTIFY LAB WITHIN 5 DAYS.        LOWEST DETECTABLE LIMITS FOR URINE DRUG SCREEN Drug Class       Cutoff (ng/mL) Amphetamine      1000 Barbiturate      200 Benzodiazepine   200 Tricyclics       300 Opiates  300 Cocaine          300 THC              50 09/28/2008 1553    Alcohol Level: No results found for this basename: ETH:2 in the last 72 hours Urinalysis:  Basename 08/17/11 1358  COLORURINE YELLOW  LABSPEC 1.010  PHURINE 6.5  GLUCOSEU NEGATIVE  HGBUR NEGATIVE  BILIRUBINUR NEGATIVE  KETONESUR NEGATIVE  PROTEINUR NEGATIVE  UROBILINOGEN 0.2  NITRITE NEGATIVE  LEUKOCYTESUR NEGATIVE    Recent Results (from the past 240 hour(s))  URINE CULTURE     Status: Normal   Collection Time   08/17/11  1:58 PM      Component Value Range Status Comment   Specimen Description URINE, CLEAN CATCH   Final    Special Requests NONE   Final    Culture  Setup Time 08/18/2011 01:40    Final    Colony Count 4,000 COLONIES/ML   Final    Culture INSIGNIFICANT GROWTH   Final    Report Status 08/18/2011 FINAL   Final   CULTURE, BLOOD (ROUTINE X 2)     Status: Normal (Preliminary result)   Collection Time   08/17/11  2:07 PM      Component Value Range Status Comment   Specimen Description Blood   Final    Special Requests NONE   Final    Culture NO GROWTH 1 DAY   Final    Report Status PENDING   Incomplete   CULTURE, BLOOD (ROUTINE X 2)     Status: Normal (Preliminary result)   Collection Time   08/17/11  2:12 PM      Component Value Range Status Comment   Specimen Description Blood LEFT HAND   Final    Special Requests BOTTLES DRAWN AEROBIC AND ANAEROBIC 6 CC EACH   Final    Culture NO GROWTH 1 DAY   Final    Report Status PENDING   Incomplete   MRSA PCR SCREENING     Status: Normal   Collection Time   08/17/11  4:41 PM      Component Value Range Status Comment   MRSA by PCR NEGATIVE  NEGATIVE Final   CLOSTRIDIUM DIFFICILE BY PCR     Status: Abnormal   Collection Time   08/17/11 10:00 PM      Component Value Range Status Comment   C difficile by pcr POSITIVE (*) NEGATIVE Final     Studies/Results: Portable Chest 1 View  08/18/2011  *RADIOLOGY REPORT*  Clinical Data: Neutropenic fever.  Cough  PORTABLE CHEST - 1 VIEW  Comparison: 08/17/2011  Findings: There is a left chest wall porta-catheter with tip in the SVC.  The heart size appears mildly enlarged.  No pleural effusion or edema.  No airspace consolidation.  Surgical clips noted in the right axilla.  IMPRESSION:  1.  No acute cardiopulmonary abnormalities.  Original Report Authenticated By: Rosealee Albee, M.D.   Dg Chest Port 1 View  08/17/2011  *RADIOLOGY REPORT*  Clinical Data: Fever.  Vomiting.  Breast cancer.  Undergoing chemotherapy.  PORTABLE CHEST - 1 VIEW  Comparison: 07/23/2011.  Findings: The left subclavian porta catheter is unchanged.  The cardiac silhouette remains mildly enlarged and the right lung is  clear.  Interval linear density at the left lung base.  Stable right axillary surgical clips.  Unremarkable bones.  IMPRESSION:  1.  Interval mild left basilar atelectasis. 2.  Stable cardiomegaly.  Original Report Authenticated By: Londell Moh  Azucena Kuba, M.D.    Medications: Scheduled Meds:   . metroNIDAZOLE  500 mg Oral Q8H  . pantoprazole  40 mg Oral Q1200  . piperacillin-tazobactam (ZOSYN)  IV  3.375 g Intravenous Q8H  . saccharomyces boulardii  250 mg Oral BID  . vancomycin  1,250 mg Intravenous Q12H  . DISCONTD: vancomycin  1,000 mg Intravenous Q12H   Continuous Infusions:   . 0.9 % NaCl with KCl 20 mEq / L 100 mL/hr at 08/19/11 0214   PRN Meds:.acetaminophen, acetaminophen, gi cocktail, magic mouthwash, morphine injection, ondansetron (ZOFRAN) IV, ondansetron, oxyCODONE  Assessment/Plan:  Principal Problem:  *Neutropenic fever Active Problems:  Dehydration  Breast cancer  Vomiting  Hypertension  SIRS (systemic inflammatory response syndrome)  C. difficile diarrhea  Plan:  This lady has a history of breast cancer and recently has her first chemotherapy approximately a week ago.  She was evaluated at the cancer center in follow up and then subsequently sent to the ED when she was noted to be febrile and dehydrated.  She was having loose stools at that time and abd pain.  She was also neutropenic.  She has a portacath in place and cultures were sent on admission.  1. Neutropenic fever.  Likely related to recent chemotherapy.  She did receive a dose of neulasta last week and her wbc count is starting to improve.  She is no longer febrile.  She was started on broad spectrum antibiotics due to concern for bacteremia.  Her blood cultures are negative thus far.  If they remain negative for 48 hours, then IV antibiotics can be discontinued.   2. C diff diarrhea.  She is on flagyl an probiotics.  She continues to have significant diarrhea.  Continue current treatments.  3. Dehydration,  improved with IVF  4. Epigastric discomfort.  Lipase/LFTs unremarkable.  Patient may have a mucositis from recent chemo.  Continue GI cocktail prn  5. Thrombocytopenia.  Likely related to recent chemo.  No signs of bleeding.  Cont to monitor  6. Breast cancer.  Follow up at the cancer center after discharge  7. Dispo.  If diarrhea improves and blood cultures remain negative, I think she can likely discharge home in the morning.    LOS: 2 days   Royale Swamy [facility] Pager: [phone] 08/19/2011, 11:58 AM

## 2011-08-20 DIAGNOSIS — C50919 Malignant neoplasm of unspecified site of unspecified female breast: Secondary | ICD-10-CM

## 2011-08-20 DIAGNOSIS — R5081 Fever presenting with conditions classified elsewhere: Secondary | ICD-10-CM

## 2011-08-20 DIAGNOSIS — D709 Neutropenia, unspecified: Secondary | ICD-10-CM

## 2011-08-20 DIAGNOSIS — A0472 Enterocolitis due to Clostridium difficile, not specified as recurrent: Secondary | ICD-10-CM

## 2011-08-20 DIAGNOSIS — E860 Dehydration: Secondary | ICD-10-CM

## 2011-08-20 LAB — BASIC METABOLIC PANEL
BUN: 6 mg/dL (ref 6–23)
CO2: 22 mEq/L (ref 19–32)
Calcium: 8.7 mg/dL (ref 8.4–10.5)
GFR calc non Af Amer: >90 mL/min (ref >90)
Glucose, Bld: 92 mg/dL (ref 70–99)
Sodium: 136 mEq/L (ref 135–145)

## 2011-08-20 LAB — CBC WITH DIFFERENTIAL/PLATELET
Basophils Relative: 1 % (ref 0–1)
Eosinophils Absolute: 0 10*3/uL (ref 0.0–0.7)
Eosinophils Relative: 1 % (ref 0–5)
Hemoglobin: 10.4 g/dL — ABNORMAL LOW (ref 12.0–15.0)
Lymphs Abs: 0.6 10*3/uL — ABNORMAL LOW (ref 0.7–4.0)
MCH: 33 pg (ref 26.0–34.0)
MCHC: 34.6 g/dL (ref 30.0–36.0)
MCV: 95.6 fL (ref 78.0–100.0)
Monocytes Relative: 15 % — ABNORMAL HIGH (ref 3–12)
Neutrophils Relative %: 51 % (ref 43–77)
Platelets: 71 10*3/uL — ABNORMAL LOW (ref 150–400)
RBC: 3.15 MIL/uL — ABNORMAL LOW (ref 3.87–5.11)

## 2011-08-20 MED ORDER — METRONIDAZOLE 500 MG PO TABS: 500.0000 mg | ORAL_TABLET | Freq: Three times a day (TID) | ORAL | Status: DC

## 2011-08-20 MED ORDER — SACCHAROMYCES BOULARDII 250 MG PO CAPS: 250.0000 mg | ORAL_CAPSULE | Freq: Two times a day (BID) | ORAL | Status: DC

## 2011-08-20 MED ORDER — HEPARIN SOD (PORK) LOCK FLUSH 100 UNIT/ML IV SOLN
500.0000 [IU] | INTRAVENOUS | Status: AC | PRN
  Administered 2011-08-20: 500 [IU]
  Filled 2011-08-20: qty 5

## 2011-08-20 NOTE — Plan of Care (Signed)
Problem: Discharge Progression Outcomes Goal: Discharge plan in place and appropriate Outcome: Completed/Met Date Met:  08/20/11 Pt d/c home with family, discharge instructions given, pt and family verbalize understanding of instructions

## 2011-08-20 NOTE — Discharge Summary (Signed)
Physician Discharge Summary  Patient ID: Danielle [name] MRN: [id number] DOB/AGE: 11/09/1955 56 y.o. Primary Care Physician:FUSCO,LAWRENCE J., MD Admit date: 08/17/2011 Discharge date: 08/20/2011    Discharge Diagnoses:  1. Neutropenic fever, blood cultures negative. Likely related to recent chemotherapy. 2. C. difficile colitis, contributing to #1. 3. Breast cancer, status post recent chemotherapy. 4. Hypertension, initially hypotensive, now improved. 5. Dehydration, improved.   Medication List  As of 08/20/2011 11:57 AM   STOP taking these medications         ibuprofen 200 MG tablet         TAKE these medications         ADRIAMYCIN IV   Inject into the vein every 21 ( twenty-one) days. To start June 27th x 4 cycles      cetirizine 10 MG tablet   Commonly known as: ZYRTEC   Take 10 mg by mouth daily as needed. For allergies      CYCLOPHOSPHAMIDE IV   Inject into the vein every 21 ( twenty-one) days. To start June 27th x 4 cycles      dexamethasone 4 MG tablet   Commonly known as: DECADRON   Take 8 mg by mouth. Starting the day after your [facility] treatment, take 2 tablets (8mg  total) in the am with food. Then for the next 2 days, take 2 tablets in the am and 2 tablets in the pm. This will be to help decrease nausea & vomiting. Take it whether you think you need it or not.     For Taxotere treatment: the day before, day of, and day after taxotere take 2 tablets in the am and 2 tablets in the pm. This will be to decrease the risk of having fluid retention from the taxotere. Take it whether you think you need it or not.      docetaxel 20 MG/0.5ML injection   Commonly known as: TAXOTERE   Inject into the vein every 21 ( twenty-one) days. To start in September after the completion of adriamycin and cytoxan. Will take this for 4 cycles.      FIRST-DUKES MOUTHWASH Susp   Use as directed 5 mLs in the mouth or throat 4 (four) times daily as needed.      lidocaine-prilocaine cream   Commonly known as: EMLA   Apply topically as needed. Apply a quarter sized amount to port site 1 hour prior to chemo. Do not rub in. Cover with plastic.      lisinopril-hydrochlorothiazide 20-12.5 MG per tablet   Commonly known as: PRINZIDE,ZESTORETIC   Take 2 tablets by mouth daily.      LORazepam 1 MG tablet   Commonly known as: ATIVAN   Take 1 tablet (1 mg total) by mouth every 4 (four) hours as needed (Nausea or vomiting).      metoprolol 50 MG tablet   Commonly known as: LOPRESSOR   Take 50 mg by mouth every morning.      metroNIDAZOLE 500 MG tablet   Commonly known as: FLAGYL   Take 1 tablet (500 mg total) by mouth every 8 (eight) hours.      NEULASTA 6 MG/0.6ML injection   Generic drug: pegfilgrastim   Inject 6 mg into the skin every 21 ( twenty-one) days. To be given 20-24 hours after chemo      ondansetron 8 MG tablet   Commonly known as: ZOFRAN   Take 8 mg by mouth 2 (two) times daily as needed. While taking your [facility] treatment: Take  1 tablet two times a day as needed for nausea or vomiting starting on the third day after chemotherapy.    When taking your Taxotere treatment: starting the day after chemo take 1 tablet in the am and 1 tablet in the pm for 3 days. Then may take 1 tablet twice a day if needed for nausea/vomiting.      prochlorperazine 25 MG suppository   Commonly known as: COMPAZINE   Place 1 suppository (25 mg total) rectally every 6 (six) hours as needed for nausea.      saccharomyces boulardii 250 MG capsule   Commonly known as: FLORASTOR   Take 1 capsule (250 mg total) by mouth 2 (two) times daily.            Discharged Condition: Stable and improved.    Consults: None.  Significant Diagnostic Studies: Portable Chest 1 View  08/18/2011  *RADIOLOGY REPORT*  Clinical Data: Neutropenic fever.  Cough  PORTABLE CHEST - 1 VIEW  Comparison: 08/17/2011  Findings: There is a left chest wall porta-catheter with tip in the SVC.  The heart size appears  mildly enlarged.  No pleural effusion or edema.  No airspace consolidation.  Surgical clips noted in the right axilla.  IMPRESSION:  1.  No acute cardiopulmonary abnormalities.  Original Report Authenticated By: Rosealee Albee, M.D.   Dg Chest Port 1 View  08/17/2011  *RADIOLOGY REPORT*  Clinical Data: Fever.  Vomiting.  Breast cancer.  Undergoing chemotherapy.  PORTABLE CHEST - 1 VIEW  Comparison: 07/23/2011.  Findings: The left subclavian porta catheter is unchanged.  The cardiac silhouette remains mildly enlarged and the right lung is clear.  Interval linear density at the left lung base.  Stable right axillary surgical clips.  Unremarkable bones.  IMPRESSION:  1.  Interval mild left basilar atelectasis. 2.  Stable cardiomegaly.  Original Report Authenticated By: Darrol Angel, M.D.   Dg Chest Port 1 View  07/23/2011  *RADIOLOGY REPORT*  Clinical Data: Port-A-Cath insertion  PORTABLE CHEST - 1 VIEW  Comparison: Portable exam 1210 hours compared to 09/28/2008  Findings: New left subclavian power port, tip projecting over SVC. No pneumothorax. Upper normal heart size. Tortuous aorta. Pulmonary vascularity normal for technique. Lungs clear. No pleural effusion or or acute osseous findings. Surgical clips right axilla question interval lymph node dissection.  IMPRESSION: No pneumothorax following Port-A-Cath insertion. Findings discussed with Dr. Leticia Penna prior to dictation of this report.  Original Report Authenticated By: Lollie Marrow, M.D.   Dg C-arm 1-60 Min-no Report  07/23/2011  CLINICAL DATA: breast cancer   C-ARM 1-60 MINUTES  Fluoroscopy was utilized by the requesting physician.  No radiographic  interpretation.      Lab Results: Basic Metabolic Panel:  Basename 08/20/11 0552 08/19/11 0310  NA 136 131*  K 3.5 3.4*  CL 105 100  CO2 22 22  GLUCOSE 92 90  BUN 6 9  CREATININE 0.64 0.71  CALCIUM 8.7 8.5  MG -- --  PHOS -- --   Liver Function Tests:  [facility] 08/18/11 0114  AST 22    ALT 46*  ALKPHOS 64  BILITOT 0.4  PROT 6.3  ALBUMIN 3.2*     CBC:  Basename 08/20/11 0552 08/19/11 0310  WBC 1.9* 1.4*  NEUTROABS 1.0* 0.5*  HGB 10.4* 10.6*  HCT 30.1* 30.7*  MCV 95.6 95.3  PLT 71* 68*    Recent Results (from the past 240 hour(s))  URINE CULTURE     Status: Normal  Collection Time   08/17/11  1:58 PM      Component Value Range Status Comment   Specimen Description URINE, CLEAN CATCH   Final    Special Requests NONE   Final    Culture  Setup Time 08/18/2011 01:40   Final    Colony Count 4,000 COLONIES/ML   Final    Culture INSIGNIFICANT GROWTH   Final    Report Status 08/18/2011 FINAL   Final   CULTURE, BLOOD (ROUTINE X 2)     Status: Normal (Preliminary result)   Collection Time   08/17/11  2:07 PM      Component Value Range Status Comment   Specimen Description Blood   Final    Special Requests NONE   Final    Culture NO GROWTH 3 DAYS   Final    Report Status PENDING   Incomplete   CULTURE, BLOOD (ROUTINE X 2)     Status: Normal (Preliminary result)   Collection Time   08/17/11  2:12 PM      Component Value Range Status Comment   Specimen Description Blood LEFT HAND   Final    Special Requests BOTTLES DRAWN AEROBIC AND ANAEROBIC 6 CC EACH   Final    Culture NO GROWTH 3 DAYS   Final    Report Status PENDING   Incomplete   MRSA PCR SCREENING     Status: Normal   Collection Time   08/17/11  4:41 PM      Component Value Range Status Comment   MRSA by PCR NEGATIVE  NEGATIVE Final   CLOSTRIDIUM DIFFICILE BY PCR     Status: Abnormal   Collection Time   08/17/11 10:00 PM      Component Value Range Status Comment   C difficile by pcr POSITIVE (*) NEGATIVE Final      Hospital Course: This very pleasant 56 year old lady was admitted with symptoms of vomiting and abdominal pain. Please see initial history and physical examination. She also was found to be febrile with a temperature of 102. Blood cultures were negative. She had ongoing diarrhea. Stool showed  the presence of C. difficile colitis. She was therefore started on oral metronidazole. She is much improved now, with less abdominal cramping and less diarrhea. Overall she feels improved. Blood cultures are been negative and therefore intravenous antibiotics have been been discontinued. Her neutropenia also is improving with the most recent white blood cell count of 1.9. Her initial white blood cell count on presentation was 0.7.  Discharge Exam: Blood pressure 162/99, pulse 85, temperature 98.5 F (36.9 C), temperature source Oral, resp. rate 16, height 5\' 3"  (1.6 m), weight 84.4 kg (186 lb 1.1 oz), SpO2 94.00%. She looks systemically well. She does not look toxic or septic. Heart sounds are present and normal. Lung fields are clear. Abdomen is soft nontender. She is alert and orientated without any focal neurological signs.  Disposition: Home. She will need a further 8 days of metronidazole to finish the course. He will followup with oncology as planned.  Discharge Orders    Future Appointments: Provider: Department: Dept Phone: Center:   08/27/2011 12:00 PM Ellouise Newer, [facility] [phone] None   08/29/2011 10:30 AM [facility] [phone] None   08/30/2011 9:15 AM [facility] [phone] None   08/31/2011 1:00 PM [facility] [phone] None   09/19/2011 10:30 AM [facility] [phone] None   09/20/2011 9:15 AM [facility] [phone] None   09/21/2011 1:00 PM [facility] [phone] None   09/24/2011 10:30 AM [facility]  None   10/10/2011 10:30 AM [facility] [phone] None   10/11/2011 9:15 AM [facility] [phone] None   10/12/2011 1:00 PM [facility] [phone] None     Future Orders Please Complete By Expires   Diet - low sodium heart healthy       Increase activity slowly           Signed: Wilson Singer Pager [phone]  08/20/2011, 11:57 AM

## 2011-08-22 ENCOUNTER — Telehealth (HOSPITAL_COMMUNITY): Payer: Self-pay | Admitting: *Deleted

## 2011-08-22 LAB — CULTURE, BLOOD (ROUTINE X 2)
Culture: NO GROWTH
Culture: NO GROWTH

## 2011-08-22 NOTE — Telephone Encounter (Signed)
For patient's yeast infection: Diflucan 100 mg #3. Take 1 tablet daily for 3 days. This message given to [place] pt's daughter-in-law.

## 2011-08-23 NOTE — Progress Notes (Signed)
HAVE NOT SEEN PT

## 2011-08-27 ENCOUNTER — Encounter (HOSPITAL_BASED_OUTPATIENT_CLINIC_OR_DEPARTMENT_OTHER): Payer: Medicaid Other | Admitting: Oncology

## 2011-08-27 ENCOUNTER — Encounter (HOSPITAL_COMMUNITY): Payer: Self-pay | Admitting: Oncology

## 2011-08-27 VITALS — BP 143/85 | HR 72 | Temp 97.9°F | Wt 177.7 lb

## 2011-08-27 DIAGNOSIS — C50419 Malignant neoplasm of upper-outer quadrant of unspecified female breast: Secondary | ICD-10-CM

## 2011-08-27 DIAGNOSIS — C773 Secondary and unspecified malignant neoplasm of axilla and upper limb lymph nodes: Secondary | ICD-10-CM

## 2011-08-27 DIAGNOSIS — C50919 Malignant neoplasm of unspecified site of unspecified female breast: Secondary | ICD-10-CM

## 2011-08-27 DIAGNOSIS — Z170 Estrogen receptor positive status [ER+]: Secondary | ICD-10-CM

## 2011-08-27 LAB — DIFFERENTIAL
Basophils Absolute: 0.1 10*3/uL (ref 0.0–0.1)
Eosinophils Absolute: 0 10*3/uL (ref 0.0–0.7)
Lymphs Abs: 1.1 10*3/uL (ref 0.7–4.0)
Monocytes Absolute: 0.9 10*3/uL (ref 0.1–1.0)
Neutro Abs: 5.1 10*3/uL (ref 1.7–7.7)

## 2011-08-27 LAB — COMPREHENSIVE METABOLIC PANEL
Albumin: 3.6 g/dL (ref 3.5–5.2)
Alkaline Phosphatase: 55 U/L (ref 39–117)
BUN: 8 mg/dL (ref 6–23)
Creatinine, Ser: 0.65 mg/dL (ref 0.50–1.10)
GFR calc Af Amer: >90 mL/min (ref >90)
Glucose, Bld: 76 mg/dL (ref 70–99)
Potassium: 3.5 mEq/L (ref 3.5–5.1)
Total Protein: 7.2 g/dL (ref 6.0–8.3)

## 2011-08-27 LAB — CBC
HCT: 32.8 % — ABNORMAL LOW (ref 36.0–46.0)
MCH: 33 pg (ref 26.0–34.0)
MCHC: 34.5 g/dL (ref 30.0–36.0)
MCV: 95.9 fL (ref 78.0–100.0)
Platelets: 539 10*3/uL — ABNORMAL HIGH (ref 150–400)
RDW: 13.2 % (ref 11.5–15.5)
WBC: 7.2 10*3/uL (ref 4.0–10.5)

## 2011-08-27 NOTE — Patient Instructions (Addendum)
Danielle [name]  [id number] [birth year] Dr. Glenford Peers  [facility]  Discharge Instructions  RECOMMENDATIONS MADE BY THE CONSULTANT AND ANY TEST RESULTS WILL BE SENT TO YOUR REFERRING DOCTOR.   EXAM FINDINGS BY MD TODAY AND SIGNS AND SYMPTOMS TO REPORT TO CLINIC OR PRIMARY MD: You are doing better.  We will check your blood counts in preparation for chemotherapy on Thursday.  MEDICATIONS PRESCRIBED: none   INSTRUCTIONS GIVEN AND DISCUSSED: Other :  Report any new lumps, bone pain, shortness of breath, fevers, uncontrolled nausea or vomiting.  SPECIAL INSTRUCTIONS/FOLLOW-UP: Return to Clinic as scheduled for chemotherapy and to see PA in 3 weeks and MD in 6 weeks.   I acknowledge that I have been informed and understand all the instructions given to me and received a copy. I do not have any more questions at this time, but understand that I may call the [facility]ty [facility] at [facility] at [phone] during business hours should I have any further questions or need assistance in obtaining follow-up care.    __________________________________________  _____________  __________ Signature of Patient or Authorized Representative            Date                   Time    __________________________________________ Nurse's Signature

## 2011-08-27 NOTE — Progress Notes (Signed)
Danielle [name] presented for [employer]. Labs per MD order drawn via Peripheral Line 23 gauge needle inserted in left AC  Good blood return present. Procedure without incident.  Needle removed intact. Patient tolerated procedure well.

## 2011-08-27 NOTE — Progress Notes (Signed)
[name], MD [address] [place] [address]  1. Breast cancer  Probiotic Product (PROBIOTIC DAILY PO), CBC, Differential, Comprehensive metabolic panel    CURRENT THERAPY: S/P cycle 1 of AC every 21 days. Started chemotherapy on 08/09/2011.  INTERVAL HISTORY: Danielle [name] 56 y.o. female returns for  regular  visit for followup of Stage II cancer the right breast with 3 of 12 positive nodes and a 3.2 cm primary that's intermediate grade with extranodal extension to the lymph nodes and LV I. Her cancer was ER positive 89%, PR positive 50%, HER-2/neu negative Ki-67 marker 33% status post her first cycle of chemotherapy on 08/09/2011.    She is due for cycle 2 on Thursday so we will get pre-chemo lab work today: CBC diff, CMET.  Se was recently admitted and discharged from the hospital due to neutropenic fever with C. Diff infection.   She is S/P discharge and completion of antibiotics.  She feels great.  She is back to baseline and wishes to move on with therapy on Thursday instead of having a 1 week break/respite.   We discussed reduction of her chemotherapy medications by 25%.  I provided her patient education regarding this dose reduction and is need due to her neutropenia.    She denies any alcohol.  She reports that she quit when Dr. Mariel Sleet asked her to.  I provided her patient education regarding EtOH cessation.   Oncologically, the patient denies any complaints.  Her BMs are back to baseline.  She denies any loose stools.    Past Medical History  Diagnosis Date  . Hypertension   . Cancer   . Borderline hyperlipidemia     managed with diet and exercise    has Cancer; Neutropenic fever; Dehydration; Breast cancer; Vomiting; Hypertension; SIRS (systemic inflammatory response syndrome); and C. difficile diarrhea on her problem list.     is allergic to bactrim and cephalexin.  Ms. Enterline had no medications administered during this visit.  Past  Surgical History  Procedure Date  . Tubal ligation 1980  . Mastectomy, partial 06/04/2011    Procedure: MASTECTOMY PARTIAL;  Surgeon: Fabio Bering, MD;  Location: [facility];  Service: General;  Laterality: Right;  . Axillary lymph node dissection 06/04/2011    Procedure: AXILLARY LYMPH NODE DISSECTION;  Surgeon: Fabio Bering, MD;  Location: [facility];  Service: General;  Laterality: Right;  . Portacath placement 07/23/2011    Procedure: INSERTION PORT-A-CATH;  Surgeon: Fabio Bering, MD;  Location: [facility];  Service: General;  Laterality: Left;  Left Subclavian    Denies any headaches, dizziness, double vision, fevers, chills, night sweats, nausea, vomiting, diarrhea, constipation, chest pain, heart palpitations, shortness of breath, blood in stool, black tarry stool, urinary pain, urinary burning, urinary frequency, hematuria.   PHYSICAL EXAMINATION  ECOG PERFORMANCE STATUS: 1 - Symptomatic but completely ambulatory  Filed Vitals:   08/27/11 1216  BP: 143/85  Pulse: 72  Temp: 97.9 F (36.6 C)    GENERAL:alert, no distress, well nourished, well developed, comfortable, cooperative, obese and smiling SKIN: skin color, texture, turgor are normal, no rashes or significant lesions HEAD: Normocephalic, No masses, lesions, tenderness or abnormalities EYES: normal, Conjunctiva are pink and non-injected EARS: External ears normal OROPHARYNX:lips, buccal mucosa, and tongue normal and mucous membranes are moist  NECK: supple, no adenopathy, thyroid normal size, non-tender, without nodularity, no stridor, non-tender, trachea midline LYMPH:  no palpable lymphadenopathy BREAST:not examined LUNGS: clear to auscultation and percussion  HEART: regular rate & rhythm, no murmurs, no gallops, S1 normal and S2 normal ABDOMEN:abdomen soft, non-tender, obese and normal bowel sounds BACK: Back symmetric, no curvature., No CVA tenderness EXTREMITIES:less then 2 second capillary refill, no joint  deformities, effusion, or inflammation, no edema, no skin discoloration, no clubbing, no cyanosis  NEURO: alert & oriented x 3 with fluent speech, no focal motor/sensory deficits, gait normal   LABORATORY DATA: CBC    Component Value Date/Time   WBC 1.9* 08/20/2011 0552   RBC 3.15* 08/20/2011 0552   HGB 10.4* 08/20/2011 0552   HCT 30.1* 08/20/2011 0552   PLT 71* 08/20/2011 0552   MCV 95.6 08/20/2011 0552   MCH 33.0 08/20/2011 0552   MCHC 34.6 08/20/2011 0552   RDW 12.5 08/20/2011 0552   LYMPHSABS 0.6* 08/20/2011 0552   MONOABS 0.3 08/20/2011 0552   EOSABS 0.0 08/20/2011 0552   BASOSABS 0.0 08/20/2011 0552      Chemistry      Component Value Date/Time   NA 136 08/20/2011 0552   K 3.5 08/20/2011 0552   CL 105 08/20/2011 0552   CO2 22 08/20/2011 0552   BUN 6 08/20/2011 0552   CREATININE 0.64 08/20/2011 0552      Component Value Date/Time   CALCIUM 8.7 08/20/2011 0552   ALKPHOS 64 08/18/2011 0114   AST 22 08/18/2011 0114   ALT 46* 08/18/2011 0114   BILITOT 0.4 08/18/2011 0114       PATHOLOGY: REASON FOR ADDENDUM, AMENDMENT OR CORRECTION: [id number]: Typo in oncology table corrected; "pN1c" corrected to read "pN1a." ADDITIONAL INFORMATION: CHROMOGENIC IN-SITU HYBRIDIZATION Interpretation HER-2/NEU BY CISH - NO AMPLIFICATION OF HER-2 DETECTED. THE RATIO OF HER-2: CEP 17 SIGNALS WAS 1.00. Reference range: Ratio: HER2:CEP17 < 1.8 - gene amplification not observed Ratio: HER2:CEP 17 1.8-2.2 - equivocal result Ratio: HER2:CEP17 > 2.2 - gene amplification observed Pecola Leisure MD Pathologist, Electronic Signature ( Signed 06/12/2011) FINAL DIAGNOSIS Diagnosis Breast, partial mastectomy, right breast mass with right axillary lymph node dissection - INVASIVE DUCTAL CARCINOMA, 3.2 CM, NOTTINGHAM COMBINED HISTOLOGIC GRADE II. - DUCTAL CARCINOMA IN SITU. - ANGIOLYMPHATIC INVASION IDENTIFIED. - THREE OF TWELVE AXILLARY LYMPH NODES, POSITIVE FOR METASTATIC DUCTAL CARCINOMA WITH EXTRANODAL EXTENSION (3/12). -  RESECTION MARGINS ARE CLEAR. - PLEASE SEE ONCOLOGY TEMPLATE FOR DETAILS. 1 of 3 Amended copy Corrected FINAL for Anne, Chaquana H ([id number]) Microscopic Comment BREAST, INVASIVE TUMOR, WITH LYMPH NODE SAMPLING Specimen, including laterality: Right breast Procedure: Partial mastectomy with right axillary lymph node dissection Grade: II Tubule formation: 2 Nuclear pleomorphism: 2 Mitotic: 2 Tumor size (gross measurement) 3.2 cm Margins: Invasive, distance to closest margin: Anterior soft tissue margin: 0.3 cm In-situ, distance to closest margin: Anterior soft margin: >1.0 cm Lymphovascular invasion: Present Ductal carcinoma in situ: Present Grade: Intermediate grade Extensive intraductal component: No Lobular neoplasia: N/A Tumor focality: Unifocal Treatment effect: No Extent of tumor: Skin: No Nipple: No Skeletal muscle: No Lymph nodes: # examined: 12 Lymph nodes with metastasis: 3 Isolated tumor cells (< 0.2 mm): 0 Micrometastasis: (> 0.2 mm and < 2.0 mm): 0 Macrometastasis: (> 2.0 mm): 3 Extracapsular extension: 2 Breast prognostic profile: Please correlate with previous case [id number] Estrogen receptor: 89%, positive, moderate staining intensity Progesterone receptor: 50%, positive, moderate staining intensity Her 2 neu: No amplification of Her 2 neu detected. The ratio of Her 2:CEP 17 signals was 1.2 Ki-67: 33%; Her 2 neu will be performed and an addendum report will follow. Non-neoplastic breast: N/A TNM: pT2, pTN1a. (HCL:kh 06-06-11) Pecola Leisure MD  Pathologist, Electronic Signature (Case signed 08/07/2011)     ASSESSMENT:  1. Stage II cancer the right breast with 3 of 12 positive nodes and a 3.2 cm primary that's intermediate grade with extranodal extension to the lymph nodes and LV I. Her cancer was ER positive 89%, PR positive 50%, HER-2/neu negative Ki-67 marker 33% status post her first cycle of chemotherapy on 08/09/2011.  2. Neutropenic fever following  1st cycle of chemotherapy requiring hospitalization. Negative blood cultures.  3. C. Diff Colitis, contributing to #2.  PLAN:  1. I personally reviewed and went over laboratory results with the patient. 2. EtOH cessation education.  Patient has already quit drinking per Dr. Mariel Sleet recommendations.  3. Pre-chemo lab work today: CBC diff, CMET 4. Chemotherapy reduction by 25%.  This is refected in the chemotherapy plan.  Neulasta support remains.  5. Patient wishes to move on with chemotherapy as scheduled on Thursday.  She looks well and feels well.  We will administer cycle 2 as scheduled.  6. Return in 3 weeks for follow-up.    All questions were answered. The patient knows to call the clinic with any problems, questions or concerns. We can certainly see the patient much sooner if necessary.  The patient and plan discussed with Glenford Peers, MD and he is in agreement with the aforementioned.  Danielle [name]

## 2011-08-29 ENCOUNTER — Other Ambulatory Visit (HOSPITAL_COMMUNITY): Payer: Medicaid Other

## 2011-08-30 ENCOUNTER — Encounter (HOSPITAL_BASED_OUTPATIENT_CLINIC_OR_DEPARTMENT_OTHER): Payer: Medicaid Other

## 2011-08-30 VITALS — BP 149/88 | HR 74 | Temp 98.2°F | Wt 175.8 lb

## 2011-08-30 DIAGNOSIS — C50419 Malignant neoplasm of upper-outer quadrant of unspecified female breast: Secondary | ICD-10-CM

## 2011-08-30 DIAGNOSIS — C50919 Malignant neoplasm of unspecified site of unspecified female breast: Secondary | ICD-10-CM

## 2011-08-30 DIAGNOSIS — Z170 Estrogen receptor positive status [ER+]: Secondary | ICD-10-CM

## 2011-08-30 DIAGNOSIS — Z5111 Encounter for antineoplastic chemotherapy: Secondary | ICD-10-CM

## 2011-08-30 DIAGNOSIS — C801 Malignant (primary) neoplasm, unspecified: Secondary | ICD-10-CM

## 2011-08-30 DIAGNOSIS — C773 Secondary and unspecified malignant neoplasm of axilla and upper limb lymph nodes: Secondary | ICD-10-CM

## 2011-08-30 MED ORDER — DOXORUBICIN HCL CHEMO IV INJECTION 2 MG/ML
45.0000 mg/m2 | Freq: Once | INTRAVENOUS | Status: AC
  Administered 2011-08-30: 86 mg via INTRAVENOUS
  Filled 2011-08-30: qty 43

## 2011-08-30 MED ORDER — DEXAMETHASONE SODIUM PHOSPHATE 4 MG/ML IJ SOLN: 12.0000 mg | Freq: Once | INTRAMUSCULAR | Status: DC

## 2011-08-30 MED ORDER — HEPARIN SOD (PORK) LOCK FLUSH 100 UNIT/ML IV SOLN
INTRAVENOUS | Status: AC
  Filled 2011-08-30: qty 5

## 2011-08-30 MED ORDER — SODIUM CHLORIDE 0.9 % IV SOLN
450.0000 mg/m2 | Freq: Once | INTRAVENOUS | Status: AC
  Administered 2011-08-30: 860 mg via INTRAVENOUS
  Filled 2011-08-30: qty 43

## 2011-08-30 MED ORDER — HEPARIN SOD (PORK) LOCK FLUSH 100 UNIT/ML IV SOLN
500.0000 [IU] | Freq: Once | INTRAVENOUS | Status: AC | PRN
  Administered 2011-08-30: 500 [IU]
  Filled 2011-08-30: qty 5

## 2011-08-30 MED ORDER — SODIUM CHLORIDE 0.9 % IV SOLN
Freq: Once | INTRAVENOUS | Status: AC
  Administered 2011-08-30: 10:00:00 via INTRAVENOUS

## 2011-08-30 MED ORDER — SODIUM CHLORIDE 0.9 % IV SOLN: 150.0000 mg | Freq: Once | INTRAVENOUS | Status: DC

## 2011-08-30 MED ORDER — PALONOSETRON HCL INJECTION 0.25 MG/5ML
0.2500 mg | Freq: Once | INTRAVENOUS | Status: AC
  Administered 2011-08-30: 0.25 mg via INTRAVENOUS

## 2011-08-30 MED ORDER — FOSAPREPITANT DIMEGLUMINE INJECTION 150 MG
Freq: Once | INTRAVENOUS | Status: AC
  Administered 2011-08-30: 10:00:00 via INTRAVENOUS
  Filled 2011-08-30: qty 5

## 2011-08-30 MED ORDER — PALONOSETRON HCL INJECTION 0.25 MG/5ML
INTRAVENOUS | Status: AC
  Filled 2011-08-30: qty 5

## 2011-08-31 ENCOUNTER — Encounter (HOSPITAL_BASED_OUTPATIENT_CLINIC_OR_DEPARTMENT_OTHER): Payer: Medicaid Other

## 2011-08-31 VITALS — BP 157/94 | HR 85

## 2011-08-31 DIAGNOSIS — C801 Malignant (primary) neoplasm, unspecified: Secondary | ICD-10-CM

## 2011-08-31 DIAGNOSIS — C773 Secondary and unspecified malignant neoplasm of axilla and upper limb lymph nodes: Secondary | ICD-10-CM

## 2011-08-31 DIAGNOSIS — C50419 Malignant neoplasm of upper-outer quadrant of unspecified female breast: Secondary | ICD-10-CM

## 2011-08-31 MED ORDER — PEGFILGRASTIM INJECTION 6 MG/0.6ML
SUBCUTANEOUS | Status: AC
  Filled 2011-08-31: qty 0.6

## 2011-08-31 MED ORDER — PEGFILGRASTIM INJECTION 6 MG/0.6ML
6.0000 mg | Freq: Once | SUBCUTANEOUS | Status: AC
  Administered 2011-08-31: 6 mg via SUBCUTANEOUS

## 2011-08-31 NOTE — Progress Notes (Signed)
Tolerated injection well. 

## 2011-09-14 NOTE — Progress Notes (Signed)
Utilization Review Complete  

## 2011-09-17 ENCOUNTER — Encounter (HOSPITAL_COMMUNITY): Payer: Medicaid Other | Attending: Oncology | Admitting: Oncology

## 2011-09-17 ENCOUNTER — Encounter (HOSPITAL_COMMUNITY): Payer: Self-pay | Admitting: Oncology

## 2011-09-17 VITALS — BP 131/85 | HR 71 | Temp 97.9°F | Resp 18 | Wt 175.7 lb

## 2011-09-17 DIAGNOSIS — R109 Unspecified abdominal pain: Secondary | ICD-10-CM

## 2011-09-17 DIAGNOSIS — K259 Gastric ulcer, unspecified as acute or chronic, without hemorrhage or perforation: Secondary | ICD-10-CM

## 2011-09-17 DIAGNOSIS — C801 Malignant (primary) neoplasm, unspecified: Secondary | ICD-10-CM | POA: Insufficient documentation

## 2011-09-17 DIAGNOSIS — C50919 Malignant neoplasm of unspecified site of unspecified female breast: Secondary | ICD-10-CM | POA: Insufficient documentation

## 2011-09-17 DIAGNOSIS — K219 Gastro-esophageal reflux disease without esophagitis: Secondary | ICD-10-CM | POA: Insufficient documentation

## 2011-09-17 DIAGNOSIS — C773 Secondary and unspecified malignant neoplasm of axilla and upper limb lymph nodes: Secondary | ICD-10-CM

## 2011-09-17 DIAGNOSIS — C50419 Malignant neoplasm of upper-outer quadrant of unspecified female breast: Secondary | ICD-10-CM

## 2011-09-17 DIAGNOSIS — Z170 Estrogen receptor positive status [ER+]: Secondary | ICD-10-CM

## 2011-09-17 HISTORY — DX: Gastric ulcer, unspecified as acute or chronic, without hemorrhage or perforation: K25.9

## 2011-09-17 MED ORDER — OMEPRAZOLE 20 MG PO CPDR: 20.0000 mg | DELAYED_RELEASE_CAPSULE | Freq: Every day | ORAL | Status: DC

## 2011-09-17 MED ORDER — SUCRALFATE 1 GM/10ML PO SUSP: 1.0000 g | Freq: Four times a day (QID) | ORAL | Status: DC

## 2011-09-17 NOTE — Patient Instructions (Signed)
Danielle [name]  [id number] 06-Jun-1955 Dr. Glenford Peers   [facility]  Discharge Instructions  RECOMMENDATIONS MADE BY THE CONSULTANT AND ANY TEST RESULTS WILL BE SENT TO YOUR REFERRING DOCTOR.   EXAM FINDINGS BY MD TODAY AND SIGNS AND SYMPTOMS TO REPORT TO CLINIC OR PRIMARY MD: Exam findings as discussed by T. Kefalas, PA-C.  SPECIAL INSTRUCTIONS/FOLLOW-UP: 1.  Take the prilosec and carafate as prescribed, these prescriptions were sent electronically to your pharmacy. 2.  Your future appointment dates are already set-up for you.  Please feel free to contact us for any questions/concerns.  I acknowledge that I have been informed and understand all the instructions given to me and received a copy. I do not have any more questions at this time, but understand that I may call the Specialty Clinic at [facility] at [phone] during business hours should I have any further questions or need assistance in obtaining follow-up care.    __________________________________________  _____________  __________ Signature of Patient or Authorized Representative            Date                   Time    __________________________________________ Nurse's Signature

## 2011-09-17 NOTE — Progress Notes (Signed)
Danielle [name]., MD [address] [place] [address]  1. Breast cancer  CBC with Differential, Comprehensive metabolic panel, CBC with Differential, Comprehensive metabolic panel, omeprazole (PRILOSEC) 20 MG capsule, sucralfate (CARAFATE) 1 GM/10ML suspension  2. GERD (gastroesophageal reflux disease)  CBC with Differential, Comprehensive metabolic panel, CBC with Differential, Comprehensive metabolic panel, omeprazole (PRILOSEC) 20 MG capsule, sucralfate (CARAFATE) 1 GM/10ML suspension    CURRENT THERAPY:S/P cycle 2 of AC every 21 days. Started chemotherapy on 08/09/2011.   INTERVAL HISTORY: Danielle [name] 56 y.o. female returns for  regular  visit for followup of Stage II cancer the right breast with 3 of 12 positive nodes and a 3.2 cm primary that's intermediate grade with extranodal extension to the lymph nodes and LV I. Her cancer was ER positive 89%, PR positive 50%, HER-2/neu negative Ki-67 marker 33% status post her first cycle of chemotherapy on 08/09/2011.    Danielle [name]'s only complaint is an abdominal pain that occurs the next day after chemotherapy.  It is a sharp pain that typically occurs while eating.  It lasts for about 30-45 minutes.  It comes and goes.  It radiates to the right costophrenic margin inferior to right breast.  It alters her ability to eat and swallow.  It last for 7 days total with progressive improvement until completely resolves on the 7th day.  She is taking Zantac but this is not helping.  She denies any foul taste in her mouth.  She also reports some neuropathic pain in her right breast at the surgical site.  I educated the patient on this and she is appreciative of this information.  Neuropathic discomfort is expected.   She also reports decreased stamina.  She notes that after 2-3 hours after exertion, she is fatigued.  This is to be expected while on chemotherapy.   Otherwise, she is doing well and denies any complaints.  Her number 1  complaint is the abdominal pain.  I suspect she has a gastric ulcer that is irritated with eating.  It may be worsened with decadron.  So we will start with a proton pump inhibitor (Prilosec) and a coating agent such as Carafate.  She is agreeable to this plan.   Past Medical History  Diagnosis Date  . Hypertension   . Cancer   . Borderline hyperlipidemia     managed with diet and exercise  . Breast cancer 08/17/2011    has Neutropenic fever; Dehydration; Breast cancer; Vomiting; Hypertension; SIRS (systemic inflammatory response syndrome); and C. difficile diarrhea on her problem list.     is allergic to bactrim and cephalexin.  Ms. Christley does not currently have medications on file.  Past Surgical History  Procedure Date  . Tubal ligation 1980  . Mastectomy, partial 06/04/2011    Procedure: MASTECTOMY PARTIAL;  Surgeon: Fabio Bering, MD;  Location: [facility];  Service: General;  Laterality: Right;  . Axillary lymph node dissection 06/04/2011    Procedure: AXILLARY LYMPH NODE DISSECTION;  Surgeon: Fabio Bering, MD;  Location: [facility];  Service: General;  Laterality: Right;  . Portacath placement 07/23/2011    Procedure: INSERTION PORT-A-CATH;  Surgeon: Fabio Bering, MD;  Location: [facility];  Service: General;  Laterality: Left;  Left Subclavian    Denies any headaches, dizziness, double vision, fevers, chills, night sweats, nausea, vomiting, diarrhea, constipation, chest pain, heart palpitations, shortness of breath, blood in stool, black tarry stool, urinary pain, urinary burning, urinary frequency, hematuria.   PHYSICAL EXAMINATION  ECOG PERFORMANCE STATUS: 1 - Symptomatic but completely ambulatory  Filed Vitals:   09/17/11 1041  BP: 131/85  Pulse: 71  Temp: 97.9 F (36.6 C)  Resp: 18    GENERAL:alert, no distress, well nourished, well developed, comfortable, cooperative, obese and smiling SKIN: skin color, texture, turgor are normal, no rashes or significant lesions HEAD:  Normocephalic, No masses, lesions, tenderness or abnormalities EYES: normal, Conjunctiva are pink and non-injected EARS: External ears normal OROPHARYNX:lips, buccal mucosa, and tongue normal and mucous membranes are moist  NECK: supple, trachea midline LYMPH:  no palpable lymphadenopathy, no hepatosplenomegaly BREAST:right breast reveals surgical incision with seroma, mild erythema and very firm.  Left breast normal without mass, skin or nipple changes or axillary nodes LUNGS: clear to auscultation and percussion HEART: regular rate & rhythm, no murmurs, no gallops, S1 normal and S2 normal ABDOMEN:abdomen soft, non-tender, obese, normal bowel sounds, no masses or organomegaly, Epigastric tenderness on palpation and RUQ tenderness on deep inspiration, and no hepatosplenomegaly BACK: Back symmetric, no curvature. EXTREMITIES:less then 2 second capillary refill, no joint deformities, effusion, or inflammation, no edema, no skin discoloration, no clubbing, no cyanosis  NEURO: alert & oriented x 3 with fluent speech, no focal motor/sensory deficits, gait normal   LABORATORY DATA: CBC    Component Value Date/Time   WBC 7.2 08/27/2011 1310   RBC 3.42* 08/27/2011 1310   HGB 11.3* 08/27/2011 1310   HCT 32.8* 08/27/2011 1310   PLT 539* 08/27/2011 1310   MCV 95.9 08/27/2011 1310   MCH 33.0 08/27/2011 1310   MCHC 34.5 08/27/2011 1310   RDW 13.2 08/27/2011 1310   LYMPHSABS 1.1 08/27/2011 1310   MONOABS 0.9 08/27/2011 1310   EOSABS 0.0 08/27/2011 1310   BASOSABS 0.1 08/27/2011 1310      Chemistry      Component Value Date/Time   NA 136 08/27/2011 1310   K 3.5 08/27/2011 1310   CL 99 08/27/2011 1310   CO2 27 08/27/2011 1310   BUN 8 08/27/2011 1310   CREATININE 0.65 08/27/2011 1310      Component Value Date/Time   CALCIUM 9.6 08/27/2011 1310   ALKPHOS 55 08/27/2011 1310   AST 25 08/27/2011 1310   ALT 37* 08/27/2011 1310   BILITOT 0.2* 08/27/2011 1310        PATHOLOGY: REASON FOR ADDENDUM, AMENDMENT  OR CORRECTION:  [id number]: Typo in oncology table corrected; "pN1c" corrected to read "pN1a."  ADDITIONAL INFORMATION:  CHROMOGENIC IN-SITU HYBRIDIZATION  Interpretation  HER-2/NEU BY CISH - NO AMPLIFICATION OF HER-2 DETECTED. THE RATIO OF HER-2: CEP 17  SIGNALS WAS 1.00.  Reference range:  Ratio: HER2:CEP17 < 1.8 - gene amplification not observed  Ratio: HER2:CEP 17 1.8-2.2 - equivocal result  Ratio: HER2:CEP17 > 2.2 - gene amplification observed  Pecola Leisure MD  Pathologist, Electronic Signature  ( Signed 06/12/2011)  FINAL DIAGNOSIS  Diagnosis  Breast, partial mastectomy, right breast mass with right axillary lymph node dissection  - INVASIVE DUCTAL CARCINOMA, 3.2 CM, NOTTINGHAM COMBINED HISTOLOGIC GRADE II.  - DUCTAL CARCINOMA IN SITU.  - ANGIOLYMPHATIC INVASION IDENTIFIED.  - THREE OF TWELVE AXILLARY LYMPH NODES, POSITIVE FOR METASTATIC DUCTAL CARCINOMA  WITH EXTRANODAL EXTENSION (3/12).  - RESECTION MARGINS ARE CLEAR.  - PLEASE SEE ONCOLOGY TEMPLATE FOR DETAILS.  1 of 3  Amended copy  Corrected FINAL for Issa, Sonnet H ([id number])  Microscopic Comment  BREAST, INVASIVE TUMOR, WITH LYMPH NODE SAMPLING  Specimen, including laterality: Right breast  Procedure: Partial mastectomy with right axillary lymph  node dissection  Grade: II  Tubule formation: 2  Nuclear pleomorphism: 2  Mitotic: 2  Tumor size (gross measurement) 3.2 cm  Margins:  Invasive, distance to closest margin: Anterior soft tissue margin: 0.3 cm  In-situ, distance to closest margin: Anterior soft margin: >1.0 cm  Lymphovascular invasion: Present  Ductal carcinoma in situ: Present  Grade: Intermediate grade  Extensive intraductal component: No  Lobular neoplasia: N/A  Tumor focality: Unifocal  Treatment effect: No  Extent of tumor:  Skin: No  Nipple: No  Skeletal muscle: No  Lymph nodes:  # examined: 12  Lymph nodes with metastasis: 3  Isolated tumor cells (< 0.2 mm): 0  Micrometastasis:  (> 0.2 mm and < 2.0 mm): 0  Macrometastasis: (> 2.0 mm): 3  Extracapsular extension: 2  Breast prognostic profile: Please correlate with previous case [id number]  Estrogen receptor: 89%, positive, moderate staining intensity  Progesterone receptor: 50%, positive, moderate staining intensity  Her 2 neu: No amplification of Her 2 neu detected. The ratio of Her 2:CEP 17 signals was 1.2  Ki-67: 33%;  Her 2 neu will be performed and an addendum report will follow.  Non-neoplastic breast: N/A  TNM: pT2, pTN1a.  (HCL:kh 06-06-11)  Pecola Leisure MD  Pathologist, Electronic Signature  (Case signed 08/07/2011)    ASSESSMENT:  1. Stage II cancer the right breast with 3 of 12 positive nodes and a 3.2 cm primary that's intermediate grade with extranodal extension to the lymph nodes and LV I. Her cancer was ER positive 89%, PR positive 50%, HER-2/neu negative Ki-67 marker 33% status post her first cycle of chemotherapy on 08/09/2011. Dose reduction of AC by 25% after #2. 2. Neutropenic fever following 1st cycle of chemotherapy requiring hospitalization. Negative blood cultures.  3. C. Diff Colitis, contributing to #2. 4. Gastric ulcer exacerbated with Decadron and chemotherapy   PLAN:  1. 2-D Echo cardiogram is scheduled for 10/05/2011. 2. Patient will undergo cycle 3 as scheduled on 09/25/2011. 3. E-scribed Prilosec 20 mg PO daily 4. E-scribed Carafate 1g/45mL four times daily. 5. Pre-chemo lab work ordered for cycle III and IV: CBC diff, CMET. 6. Return as scheduled for follow-up after cycle 3 of chemotherapy.    All questions were answered. The patient knows to call the clinic with any problems, questions or concerns. We can certainly see the patient much sooner if necessary.  KEFALAS,THOMAS

## 2011-09-19 ENCOUNTER — Other Ambulatory Visit (HOSPITAL_COMMUNITY): Payer: Medicaid Other

## 2011-09-20 ENCOUNTER — Inpatient Hospital Stay (HOSPITAL_COMMUNITY): Payer: Medicaid Other

## 2011-09-21 ENCOUNTER — Ambulatory Visit (HOSPITAL_COMMUNITY): Payer: Medicaid Other

## 2011-09-24 ENCOUNTER — Other Ambulatory Visit (HOSPITAL_COMMUNITY): Payer: Medicaid Other

## 2011-09-24 ENCOUNTER — Encounter (HOSPITAL_BASED_OUTPATIENT_CLINIC_OR_DEPARTMENT_OTHER): Payer: Medicaid Other

## 2011-09-24 DIAGNOSIS — C773 Secondary and unspecified malignant neoplasm of axilla and upper limb lymph nodes: Secondary | ICD-10-CM

## 2011-09-24 DIAGNOSIS — C50919 Malignant neoplasm of unspecified site of unspecified female breast: Secondary | ICD-10-CM

## 2011-09-24 DIAGNOSIS — C50419 Malignant neoplasm of upper-outer quadrant of unspecified female breast: Secondary | ICD-10-CM

## 2011-09-24 DIAGNOSIS — K219 Gastro-esophageal reflux disease without esophagitis: Secondary | ICD-10-CM

## 2011-09-24 LAB — CBC WITH DIFFERENTIAL/PLATELET
Basophils Absolute: 0.1 10*3/uL (ref 0.0–0.1)
HCT: 35 % — ABNORMAL LOW (ref 36.0–46.0)
Lymphocytes Relative: 22 % (ref 12–46)
Lymphs Abs: 1.2 10*3/uL (ref 0.7–4.0)
MCV: 96.7 fL (ref 78.0–100.0)
Neutro Abs: 3.3 10*3/uL (ref 1.7–7.7)
Platelets: 367 10*3/uL (ref 150–400)
RBC: 3.62 MIL/uL — ABNORMAL LOW (ref 3.87–5.11)
RDW: 15.2 % (ref 11.5–15.5)
WBC: 5.6 10*3/uL (ref 4.0–10.5)

## 2011-09-24 LAB — COMPREHENSIVE METABOLIC PANEL
ALT: 29 U/L (ref 0–35)
AST: 27 U/L (ref 0–37)
Alkaline Phosphatase: 67 U/L (ref 39–117)
CO2: 25 mEq/L (ref 19–32)
Chloride: 101 mEq/L (ref 96–112)
GFR calc Af Amer: >90 mL/min (ref >90)
GFR calc non Af Amer: 80 mL/min — ABNORMAL LOW (ref >90)
Glucose, Bld: 102 mg/dL — ABNORMAL HIGH (ref 70–99)
Sodium: 138 mEq/L (ref 135–145)
Total Bilirubin: 0.3 mg/dL (ref 0.3–1.2)

## 2011-09-24 NOTE — Progress Notes (Signed)
Labs drawn today for cbc/diff,cmp 

## 2011-09-25 ENCOUNTER — Encounter (HOSPITAL_BASED_OUTPATIENT_CLINIC_OR_DEPARTMENT_OTHER): Payer: Medicaid Other

## 2011-09-25 VITALS — BP 146/84 | HR 70 | Temp 98.1°F | Resp 18 | Ht 63.0 in | Wt 178.6 lb

## 2011-09-25 DIAGNOSIS — C773 Secondary and unspecified malignant neoplasm of axilla and upper limb lymph nodes: Secondary | ICD-10-CM

## 2011-09-25 DIAGNOSIS — C801 Malignant (primary) neoplasm, unspecified: Secondary | ICD-10-CM

## 2011-09-25 DIAGNOSIS — Z5111 Encounter for antineoplastic chemotherapy: Secondary | ICD-10-CM

## 2011-09-25 DIAGNOSIS — C50419 Malignant neoplasm of upper-outer quadrant of unspecified female breast: Secondary | ICD-10-CM

## 2011-09-25 MED ORDER — PALONOSETRON HCL INJECTION 0.25 MG/5ML
INTRAVENOUS | Status: AC
  Filled 2011-09-25: qty 5

## 2011-09-25 MED ORDER — DOXORUBICIN HCL CHEMO IV INJECTION 2 MG/ML
45.0000 mg/m2 | Freq: Once | INTRAVENOUS | Status: DC
  Filled 2011-09-25: qty 43

## 2011-09-25 MED ORDER — SODIUM CHLORIDE 0.9 % IV SOLN
Freq: Once | INTRAVENOUS | Status: AC
  Administered 2011-09-25: 10:00:00 via INTRAVENOUS
  Filled 2011-09-25: qty 5

## 2011-09-25 MED ORDER — SODIUM CHLORIDE 0.9 % IV SOLN
450.0000 mg/m2 | Freq: Once | INTRAVENOUS | Status: DC
  Filled 2011-09-25: qty 43

## 2011-09-25 MED ORDER — SODIUM CHLORIDE 0.9 % IV SOLN
Freq: Once | INTRAVENOUS | Status: AC
  Administered 2011-09-25: 500 mL via INTRAVENOUS

## 2011-09-25 MED ORDER — HEPARIN SOD (PORK) LOCK FLUSH 100 UNIT/ML IV SOLN
500.0000 [IU] | Freq: Once | INTRAVENOUS | Status: AC | PRN
  Administered 2011-09-25: 500 [IU]
  Filled 2011-09-25: qty 5

## 2011-09-25 MED ORDER — DOXORUBICIN HCL CHEMO IV INJECTION 2 MG/ML
45.0000 mg/m2 | Freq: Once | INTRAVENOUS | Status: AC
  Administered 2011-09-25: 86 mg via INTRAVENOUS
  Filled 2011-09-25: qty 43

## 2011-09-25 MED ORDER — SODIUM CHLORIDE 0.9 % IV SOLN: 150.0000 mg | Freq: Once | INTRAVENOUS | Status: DC

## 2011-09-25 MED ORDER — DEXAMETHASONE SODIUM PHOSPHATE 4 MG/ML IJ SOLN: 12.0000 mg | Freq: Once | INTRAMUSCULAR | Status: DC

## 2011-09-25 MED ORDER — SODIUM CHLORIDE 0.9 % IJ SOLN
10.0000 mL | INTRAMUSCULAR | Status: DC | PRN
  Filled 2011-09-25: qty 10

## 2011-09-25 MED ORDER — SODIUM CHLORIDE 0.9 % IJ SOLN
INTRAMUSCULAR | Status: AC
  Filled 2011-09-25: qty 10

## 2011-09-25 MED ORDER — HEPARIN SOD (PORK) LOCK FLUSH 100 UNIT/ML IV SOLN
INTRAVENOUS | Status: AC
  Filled 2011-09-25: qty 5

## 2011-09-25 MED ORDER — SODIUM CHLORIDE 0.9 % IV SOLN
450.0000 mg/m2 | Freq: Once | INTRAVENOUS | Status: AC
  Administered 2011-09-25: 860 mg via INTRAVENOUS
  Filled 2011-09-25: qty 43

## 2011-09-25 MED ORDER — PALONOSETRON HCL INJECTION 0.25 MG/5ML
0.2500 mg | Freq: Once | INTRAVENOUS | Status: AC
  Administered 2011-09-25: 0.25 mg via INTRAVENOUS

## 2011-09-25 NOTE — Progress Notes (Signed)
Tolerated infusion well. 

## 2011-09-25 NOTE — Addendum Note (Signed)
Addended by: Phylliss Blakes on: 09/25/2011 09:43 AM   Modules accepted: Orders

## 2011-09-26 ENCOUNTER — Encounter (HOSPITAL_BASED_OUTPATIENT_CLINIC_OR_DEPARTMENT_OTHER): Payer: Medicaid Other

## 2011-09-26 VITALS — BP 151/96 | HR 76

## 2011-09-26 DIAGNOSIS — Z5189 Encounter for other specified aftercare: Secondary | ICD-10-CM

## 2011-09-26 DIAGNOSIS — C773 Secondary and unspecified malignant neoplasm of axilla and upper limb lymph nodes: Secondary | ICD-10-CM

## 2011-09-26 DIAGNOSIS — C50419 Malignant neoplasm of upper-outer quadrant of unspecified female breast: Secondary | ICD-10-CM

## 2011-09-26 DIAGNOSIS — C50919 Malignant neoplasm of unspecified site of unspecified female breast: Secondary | ICD-10-CM

## 2011-09-26 MED ORDER — PEGFILGRASTIM INJECTION 6 MG/0.6ML
SUBCUTANEOUS | Status: AC
  Filled 2011-09-26: qty 0.6

## 2011-09-26 MED ORDER — PEGFILGRASTIM INJECTION 6 MG/0.6ML
6.0000 mg | Freq: Once | SUBCUTANEOUS | Status: AC
  Administered 2011-09-26: 6 mg via SUBCUTANEOUS

## 2011-09-26 NOTE — Progress Notes (Signed)
Tolerated neulasta inj well.  Stated did well with chemo yesterday.

## 2011-10-01 ENCOUNTER — Other Ambulatory Visit (HOSPITAL_COMMUNITY): Payer: Self-pay | Admitting: Oncology

## 2011-10-05 ENCOUNTER — Ambulatory Visit (HOSPITAL_COMMUNITY)
Admission: RE | Admit: 2011-10-05 | Discharge: 2011-10-05 | Disposition: A | Discharge status: Home / Self Care | Payer: Medicaid Other | Source: Ambulatory Visit | Attending: Oncology | Admitting: Oncology

## 2011-10-05 DIAGNOSIS — I10 Essential (primary) hypertension: Secondary | ICD-10-CM | POA: Insufficient documentation

## 2011-10-05 DIAGNOSIS — Z9221 Personal history of antineoplastic chemotherapy: Secondary | ICD-10-CM | POA: Insufficient documentation

## 2011-10-05 DIAGNOSIS — C50919 Malignant neoplasm of unspecified site of unspecified female breast: Secondary | ICD-10-CM | POA: Insufficient documentation

## 2011-10-05 DIAGNOSIS — I059 Rheumatic mitral valve disease, unspecified: Secondary | ICD-10-CM

## 2011-10-05 NOTE — Progress Notes (Signed)
*  PRELIMINARY RESULTS* Echocardiogram 2D Echocardiogram has been performed.  Danielle [name] 10/05/2011, 12:04 PM

## 2011-10-08 ENCOUNTER — Encounter (HOSPITAL_BASED_OUTPATIENT_CLINIC_OR_DEPARTMENT_OTHER): Payer: Medicaid Other | Admitting: Oncology

## 2011-10-08 ENCOUNTER — Encounter (HOSPITAL_COMMUNITY): Payer: Self-pay | Admitting: Oncology

## 2011-10-08 VITALS — BP 118/76 | HR 64 | Temp 97.8°F | Resp 16 | Wt 178.5 lb

## 2011-10-08 DIAGNOSIS — C50919 Malignant neoplasm of unspecified site of unspecified female breast: Secondary | ICD-10-CM

## 2011-10-08 DIAGNOSIS — C773 Secondary and unspecified malignant neoplasm of axilla and upper limb lymph nodes: Secondary | ICD-10-CM

## 2011-10-08 DIAGNOSIS — J449 Chronic obstructive pulmonary disease, unspecified: Secondary | ICD-10-CM

## 2011-10-08 DIAGNOSIS — C50419 Malignant neoplasm of upper-outer quadrant of unspecified female breast: Secondary | ICD-10-CM

## 2011-10-08 DIAGNOSIS — Z170 Estrogen receptor positive status [ER+]: Secondary | ICD-10-CM

## 2011-10-08 NOTE — Progress Notes (Signed)
#  1 stage II cancer the right breast with 3 of 12 positive nodes and a 3.2 cm primary cancer intermediate grade with extranodal extension of the lymph nodes with LV I. Her cancer was ER positive, PR positive, HER-2/neu not amplified. Her Ki-67 marker was high at 33% and she is now status post 3 cycles of Adriamycin and Cytoxan. Her 2-D echo the other day was very stable. She will then progress to Taxotere chemotherapy we will then pursue radiation therapy and then hormonal therapy. Her estrogen receptors were 89% progesterone receptors 50% #2 COPD still smoking a few cigarettes a day  She is tolerating chemotherapy much better now. The last 2 cycles were very well tolerated. She has her appointment already for cycle 4. She is stable in appearance review of systems oncologically is noncontributory. Vital signs are recorded. She is in no acute distress. Lung still show diminished breath sounds throughout. She has no adenopathy. The right breast as the surgical changes and slight swelling and edema still in the right breast. There is a real thickened area near the upper outer quadrant scar towards the axilla. This is an area that is probably about 6 by 4 cm. She has some breast edema as I mentioned in the right breast. The left breast is negative. Port-A-Cath is okay. Heart shows a regular rhythm and rate without murmur rub or gallop. Abdomen is soft and nontender without hepatosplenomegaly. She has no peripheral leg or arm edema. We will continue with therapy.

## 2011-10-08 NOTE — Patient Instructions (Addendum)
Danielle [name]  DOB 21-Apr-1955 CSN [id number]  MRN [id number] Dr. Glenford Peers   [facility]  Discharge Instructions  RECOMMENDATIONS MADE BY THE CONSULTANT AND ANY TEST RESULTS WILL BE SENT TO YOUR REFERRING DOCTOR.   EXAM FINDINGS BY MD TODAY AND SIGNS AND SYMPTOMS TO REPORT TO CLINIC OR PRIMARY MD: you are doing well.  Continue taking the carafate and watch your diet.  MEDICATIONS PRESCRIBED: none   INSTRUCTIONS GIVEN AND DISCUSSED: Other :  Report fevers, uncontrolled nausea or vomiting, any new lumps, bone pain etc.  SPECIAL INSTRUCTIONS/FOLLOW-UP: Return to Clinic as scheduled for chemotherapy and to see the PA in 3 weeks.   I acknowledge that I have been informed and understand all the instructions given to me and received a copy. I do not have any more questions at this time, but understand that I may call the Specialty Clinic at [facility] at [phone] during business hours should I have any further questions or need assistance in obtaining follow-up care.    __________________________________________  _____________  __________ Signature of Patient or Authorized Representative            Date                   Time    __________________________________________ Nurse's Signature

## 2011-10-10 ENCOUNTER — Other Ambulatory Visit (HOSPITAL_COMMUNITY): Payer: Medicaid Other

## 2011-10-11 ENCOUNTER — Inpatient Hospital Stay (HOSPITAL_COMMUNITY): Payer: Medicaid Other

## 2011-10-12 ENCOUNTER — Ambulatory Visit (HOSPITAL_COMMUNITY): Payer: Medicaid Other

## 2011-10-16 ENCOUNTER — Encounter (HOSPITAL_COMMUNITY): Payer: Medicaid Other | Attending: Oncology

## 2011-10-16 VITALS — BP 133/81 | HR 69 | Temp 97.9°F | Resp 16

## 2011-10-16 DIAGNOSIS — K219 Gastro-esophageal reflux disease without esophagitis: Secondary | ICD-10-CM | POA: Insufficient documentation

## 2011-10-16 DIAGNOSIS — C801 Malignant (primary) neoplasm, unspecified: Secondary | ICD-10-CM | POA: Insufficient documentation

## 2011-10-16 DIAGNOSIS — C50419 Malignant neoplasm of upper-outer quadrant of unspecified female breast: Secondary | ICD-10-CM

## 2011-10-16 DIAGNOSIS — C773 Secondary and unspecified malignant neoplasm of axilla and upper limb lymph nodes: Secondary | ICD-10-CM

## 2011-10-16 DIAGNOSIS — Z5111 Encounter for antineoplastic chemotherapy: Secondary | ICD-10-CM

## 2011-10-16 DIAGNOSIS — C50919 Malignant neoplasm of unspecified site of unspecified female breast: Secondary | ICD-10-CM | POA: Insufficient documentation

## 2011-10-16 LAB — COMPREHENSIVE METABOLIC PANEL
Alkaline Phosphatase: 71 U/L (ref 39–117)
BUN: 10 mg/dL (ref 6–23)
CO2: 24 mEq/L (ref 19–32)
Chloride: 101 mEq/L (ref 96–112)
Creatinine, Ser: 0.7 mg/dL (ref 0.50–1.10)
GFR calc non Af Amer: >90 mL/min (ref >90)
Glucose, Bld: 113 mg/dL — ABNORMAL HIGH (ref 70–99)
Potassium: 3.5 mEq/L (ref 3.5–5.1)
Total Bilirubin: 0.2 mg/dL — ABNORMAL LOW (ref 0.3–1.2)

## 2011-10-16 LAB — CBC WITH DIFFERENTIAL/PLATELET
Basophils Absolute: 0.1 K/uL (ref 0.0–0.1)
Basophils Relative: 1 % (ref 0–1)
Eosinophils Absolute: 0 K/uL (ref 0.0–0.7)
Eosinophils Relative: 0 % (ref 0–5)
HCT: 33.6 % — ABNORMAL LOW (ref 36.0–46.0)
Hemoglobin: 11.5 g/dL — ABNORMAL LOW (ref 12.0–15.0)
Lymphocytes Relative: 17 % (ref 12–46)
Lymphs Abs: 1.1 K/uL (ref 0.7–4.0)
MCH: 34 pg (ref 26.0–34.0)
MCHC: 34.2 g/dL (ref 30.0–36.0)
MCV: 99.4 fL (ref 78.0–100.0)
Monocytes Absolute: 0.5 K/uL (ref 0.1–1.0)
Monocytes Relative: 7 % (ref 3–12)
Neutro Abs: 4.9 K/uL (ref 1.7–7.7)
Neutrophils Relative %: 75 % (ref 43–77)
Platelets: 356 K/uL (ref 150–400)
RBC: 3.38 MIL/uL — ABNORMAL LOW (ref 3.87–5.11)
RDW: 17 % — ABNORMAL HIGH (ref 11.5–15.5)
WBC: 6.6 K/uL (ref 4.0–10.5)

## 2011-10-16 MED ORDER — SODIUM CHLORIDE 0.9 % IJ SOLN
INTRAMUSCULAR | Status: AC
  Filled 2011-10-16: qty 20

## 2011-10-16 MED ORDER — DEXAMETHASONE SODIUM PHOSPHATE 4 MG/ML IJ SOLN: 12.0000 mg | Freq: Once | INTRAMUSCULAR | Status: DC

## 2011-10-16 MED ORDER — PALONOSETRON HCL INJECTION 0.25 MG/5ML
INTRAVENOUS | Status: AC
  Filled 2011-10-16: qty 5

## 2011-10-16 MED ORDER — PALONOSETRON HCL INJECTION 0.25 MG/5ML
0.2500 mg | Freq: Once | INTRAVENOUS | Status: AC
  Administered 2011-10-16: 0.25 mg via INTRAVENOUS

## 2011-10-16 MED ORDER — SODIUM CHLORIDE 0.9 % IV SOLN
450.0000 mg/m2 | Freq: Once | INTRAVENOUS | Status: AC
  Administered 2011-10-16: 860 mg via INTRAVENOUS
  Filled 2011-10-16: qty 43

## 2011-10-16 MED ORDER — HEPARIN SOD (PORK) LOCK FLUSH 100 UNIT/ML IV SOLN
INTRAVENOUS | Status: AC
  Filled 2011-10-16: qty 5

## 2011-10-16 MED ORDER — SODIUM CHLORIDE 0.9 % IV SOLN: 150.0000 mg | Freq: Once | INTRAVENOUS | Status: DC

## 2011-10-16 MED ORDER — SODIUM CHLORIDE 0.9 % IJ SOLN
10.0000 mL | INTRAMUSCULAR | Status: DC | PRN
Start: 2011-10-16 — End: 2011-10-16
  Administered 2011-10-16: 10 mL
  Filled 2011-10-16: qty 10

## 2011-10-16 MED ORDER — HEPARIN SOD (PORK) LOCK FLUSH 100 UNIT/ML IV SOLN
500.0000 [IU] | Freq: Once | INTRAVENOUS | Status: AC | PRN
  Administered 2011-10-16: 500 [IU]
  Filled 2011-10-16: qty 5

## 2011-10-16 MED ORDER — SODIUM CHLORIDE 0.9 % IV SOLN
Freq: Once | INTRAVENOUS | Status: AC
  Administered 2011-10-16: 11:00:00 via INTRAVENOUS

## 2011-10-16 MED ORDER — DOXORUBICIN HCL CHEMO IV INJECTION 2 MG/ML
45.0000 mg/m2 | Freq: Once | INTRAVENOUS | Status: AC
  Administered 2011-10-16: 86 mg via INTRAVENOUS
  Filled 2011-10-16: qty 43

## 2011-10-16 MED ORDER — SODIUM CHLORIDE 0.9 % IV SOLN
Freq: Once | INTRAVENOUS | Status: AC
  Administered 2011-10-16: 12:00:00 via INTRAVENOUS
  Filled 2011-10-16: qty 5

## 2011-10-16 NOTE — Progress Notes (Signed)
Tolerated chemo well.  Will return tomorrow for neulasta. 

## 2011-10-17 ENCOUNTER — Encounter (HOSPITAL_BASED_OUTPATIENT_CLINIC_OR_DEPARTMENT_OTHER): Payer: Medicaid Other

## 2011-10-17 VITALS — BP 132/88 | HR 80

## 2011-10-17 DIAGNOSIS — Z5189 Encounter for other specified aftercare: Secondary | ICD-10-CM

## 2011-10-17 DIAGNOSIS — C801 Malignant (primary) neoplasm, unspecified: Secondary | ICD-10-CM

## 2011-10-17 DIAGNOSIS — C50419 Malignant neoplasm of upper-outer quadrant of unspecified female breast: Secondary | ICD-10-CM

## 2011-10-17 DIAGNOSIS — C773 Secondary and unspecified malignant neoplasm of axilla and upper limb lymph nodes: Secondary | ICD-10-CM

## 2011-10-17 MED ORDER — PEGFILGRASTIM INJECTION 6 MG/0.6ML
6.0000 mg | Freq: Once | SUBCUTANEOUS | Status: AC
  Administered 2011-10-17: 6 mg via SUBCUTANEOUS

## 2011-10-17 MED ORDER — PEGFILGRASTIM INJECTION 6 MG/0.6ML
SUBCUTANEOUS | Status: AC
  Filled 2011-10-17: qty 0.6

## 2011-10-17 NOTE — Progress Notes (Signed)
Tolerated injection well. 

## 2011-10-29 ENCOUNTER — Ambulatory Visit (HOSPITAL_COMMUNITY): Payer: Medicaid Other | Admitting: Oncology

## 2011-11-05 ENCOUNTER — Encounter (HOSPITAL_COMMUNITY): Payer: Self-pay | Admitting: Oncology

## 2011-11-05 ENCOUNTER — Encounter (HOSPITAL_COMMUNITY): Payer: Medicaid Other

## 2011-11-05 ENCOUNTER — Encounter (HOSPITAL_BASED_OUTPATIENT_CLINIC_OR_DEPARTMENT_OTHER): Payer: Medicaid Other | Admitting: Oncology

## 2011-11-05 VITALS — BP 169/94 | HR 92 | Temp 97.8°F | Resp 18 | Wt 177.1 lb

## 2011-11-05 DIAGNOSIS — J449 Chronic obstructive pulmonary disease, unspecified: Secondary | ICD-10-CM

## 2011-11-05 DIAGNOSIS — J4489 Other specified chronic obstructive pulmonary disease: Secondary | ICD-10-CM

## 2011-11-05 DIAGNOSIS — F17200 Nicotine dependence, unspecified, uncomplicated: Secondary | ICD-10-CM

## 2011-11-05 DIAGNOSIS — C50919 Malignant neoplasm of unspecified site of unspecified female breast: Secondary | ICD-10-CM

## 2011-11-05 DIAGNOSIS — C773 Secondary and unspecified malignant neoplasm of axilla and upper limb lymph nodes: Secondary | ICD-10-CM

## 2011-11-05 DIAGNOSIS — C50419 Malignant neoplasm of upper-outer quadrant of unspecified female breast: Secondary | ICD-10-CM

## 2011-11-05 LAB — COMPREHENSIVE METABOLIC PANEL
Albumin: 4.2 g/dL (ref 3.5–5.2)
Alkaline Phosphatase: 83 U/L (ref 39–117)
BUN: 15 mg/dL (ref 6–23)
CO2: 25 mEq/L (ref 19–32)
Chloride: 100 mEq/L (ref 96–112)
GFR calc Af Amer: >90 mL/min (ref >90)
GFR calc non Af Amer: >90 mL/min (ref >90)
Glucose, Bld: 105 mg/dL — ABNORMAL HIGH (ref 70–99)
Potassium: 4 mEq/L (ref 3.5–5.1)
Total Bilirubin: 0.3 mg/dL (ref 0.3–1.2)

## 2011-11-05 LAB — CBC WITH DIFFERENTIAL/PLATELET
HCT: 36.6 % (ref 36.0–46.0)
Hemoglobin: 12.1 g/dL (ref 12.0–15.0)
Lymphocytes Relative: 9 % — ABNORMAL LOW (ref 12–46)
Lymphs Abs: 0.7 10*3/uL (ref 0.7–4.0)
Monocytes Relative: 5 % (ref 3–12)
Neutro Abs: 7.4 10*3/uL (ref 1.7–7.7)
Neutrophils Relative %: 86 % — ABNORMAL HIGH (ref 43–77)
RBC: 3.55 MIL/uL — ABNORMAL LOW (ref 3.87–5.11)

## 2011-11-05 NOTE — Progress Notes (Signed)
[name], MD [address] [place] [address]  1. Breast CA  CBC with Differential, Comprehensive metabolic panel, CBC with Differential, Comprehensive metabolic panel    CURRENT THERAPY: S/P right partial mastectomy and axillary node dissection on 06/04/2011 by Dr. Tilford Pillar. S/P 4 cycles of AC (08/09/2011- 10/16/2011).  She will embark on cycle 1 of Taxotere tomorrow every 21 days x 4 cycles.   INTERVAL HISTORY: Danielle [name] 56 y.o. female returns for  regular  visit for followup of Stage II cancer the right breast with 3 of 12 positive nodes and a 3.2 cm primary cancer intermediate grade with extranodal extension of the lymph nodes with LV I. Her cancer was ER positive, PR positive, HER-2/neu not amplified. Her Ki-67 marker was high at 33% and she is now status post 4 cycles of Adriamycin and Cytoxan. Her 2-D echo the other day was very stable. She is now progressing on to Taxotere chemotherapy, followed by radiation therapy and then hormonal therapy. Her estrogen receptors were 89% progesterone receptors 50%.  Danielle [name] is doing well.  She reports some pain starting in her right buttock radiating down her right leg.  Review of the CT report of Abd/pelvis in May 2013 was negative for any mentioning of spinal issues.  She denies any vertebral pain.  She denies any trauma.  She denies any loss of bowel or urine control.  She denies any loss of sensation of her right LE.  She is ambulating fine with a small favoring of her right leg.  It is actually improved with a straight leg raise.  This is likely musculoskeletal in nature.  She denies any trauma.  The pain worsens when walking and performing activity.  It had a sudden onset after awakening one AM.   She did get scratched by her feline on her left leg.  This seems to be healing nicely without any discharge or indication of infection.  No fevers or chills.    Otherwise, she is prepared for chemotherapy tomorrow.  This  will he her 1st cycle of Taxotere chemotherapy.  She was provided chemo teaching in the past on the drug.  We discussed the risks, benefits, and side effects of this regimen.  All questions were answered.   Pre-chemo lab work drawn today.   Past Medical History  Diagnosis Date  . Hypertension   . Cancer   . Borderline hyperlipidemia     managed with diet and exercise  . Breast cancer 08/17/2011  . Gastric ulcer 09/17/2011    has Neutropenic fever; Dehydration; Breast cancer; Vomiting; Hypertension; SIRS (systemic inflammatory response syndrome); C. difficile diarrhea; and Gastric ulcer on her problem list.     is allergic to bactrim and cephalexin.  Danielle [name] had no medications administered during this visit.  Past Surgical History  Procedure Date  . Tubal ligation 1980  . Mastectomy, partial 06/04/2011    Procedure: MASTECTOMY PARTIAL;  Surgeon: Fabio Bering, MD;  Location: [facility];  Service: General;  Laterality: Right;  . Axillary lymph node dissection 06/04/2011    Procedure: AXILLARY LYMPH NODE DISSECTION;  Surgeon: Fabio Bering, MD;  Location: [facility];  Service: General;  Laterality: Right;  . Portacath placement 07/23/2011    Procedure: INSERTION PORT-A-CATH;  Surgeon: Fabio Bering, MD;  Location: [facility];  Service: General;  Laterality: Left;  Left Subclavian    Denies any headaches, dizziness, double vision, fevers, chills, night sweats, nausea, vomiting, diarrhea, constipation, chest pain,  heart palpitations, shortness of breath, blood in stool, black tarry stool, urinary pain, urinary burning, urinary frequency, hematuria.   PHYSICAL EXAMINATION  ECOG PERFORMANCE STATUS: 0 - Asymptomatic  Filed Vitals:   11/05/11 0900  BP: 169/94  Pulse: 92  Temp: 97.8 F (36.6 C)  Resp: 18    GENERAL:alert, no distress, well nourished, well developed, comfortable, cooperative and smiling SKIN: skin color, texture, turgor are normal, no rashes or significant lesions HEAD:  Normocephalic, No masses, lesions, tenderness or abnormalities EYES: normal, Conjunctiva are pink and non-injected EARS: External ears normal OROPHARYNX:lips, buccal mucosa, and tongue normal and mucous membranes are moist  NECK: supple, no adenopathy, trachea midline LYMPH:  no palpable lymphadenopathy, no hepatosplenomegaly BREAST:patient declines to have breast exam LUNGS: clear to auscultation and percussion HEART: regular rate & rhythm, no murmurs, no gallops, S1 normal and S2 normal ABDOMEN:abdomen soft, non-tender, obese, normal bowel sounds, no masses or organomegaly and no hepatosplenomegaly BACK: Back symmetric, no curvature., No CVA tenderness, no spinal tenderness on deep palpation and percussion. EXTREMITIES:less then 2 second capillary refill, no joint deformities, effusion, or inflammation, no edema, no skin discoloration, no clubbing, no cyanosis, straight leg raise is negative an actually improves her right LE discomfort.   NEURO: alert & oriented x 3 with fluent speech, no focal motor/sensory deficits, gait normal    LABORATORY DATA: CBC    Component Value Date/Time   WBC 6.6 10/16/2011 0932   RBC 3.38* 10/16/2011 0932   HGB 11.5* 10/16/2011 0932   HCT 33.6* 10/16/2011 0932   PLT 356 10/16/2011 0932   MCV 99.4 10/16/2011 0932   MCH 34.0 10/16/2011 0932   MCHC 34.2 10/16/2011 0932   RDW 17.0* 10/16/2011 0932   LYMPHSABS 1.1 10/16/2011 0932   MONOABS 0.5 10/16/2011 0932   EOSABS 0.0 10/16/2011 0932   BASOSABS 0.1 10/16/2011 0932      Chemistry      Component Value Date/Time   NA 136 10/16/2011 0932   K 3.5 10/16/2011 0932   CL 101 10/16/2011 0932   CO2 24 10/16/2011 0932   BUN 10 10/16/2011 0932   CREATININE 0.70 10/16/2011 0932      Component Value Date/Time   CALCIUM 9.1 10/16/2011 0932   ALKPHOS 71 10/16/2011 0932   AST 19 10/16/2011 0932   ALT 19 10/16/2011 0932   BILITOT 0.2* 10/16/2011 0932      PENDING LABS: CBC diff, CMET    ASSESSMENT:  1. Stage II cancer the right breast with 3 of  12 positive nodes and a 3.2 cm primary cancer intermediate grade with extranodal extension of the lymph nodes with LV I. Her cancer was ER positive, PR positive, HER-2/neu not amplified. Her Ki-67 marker was high at 33% and she is now status post 4 cycles of Adriamycin and Cytoxan. Her 2-D echo the other day was very stable. She is now progressing on to Taxotere chemotherapy, followed by radiation therapy and then hormonal therapy. Her estrogen receptors were 89% progesterone receptors 50%  2. COPD still smoking a few cigarettes a day   PLAN:  1. 2-D echo at beginning of November. 2. Cycle 1 of Taxotere chemotherapy tomorrow as scheduled.  3. Pre-chemo lab work today: CBC diff, CMET 4. Lab work in 3 weeks in preparation for cycle 2 of Taxotere: CBC diff, CMET 5. Patient education provided regarding Taxotere chemotherapy.  We discussed the risks, benefits, and side effects of therapy.  Following Taxotere, the patient will go on to have radiation therapy  followed by endocrine therapy.  6. Aleve 4 x daily for 5-7 days.  7. Return in 3 weeks for follow-up.   Danielle [name] will call us in 5-7 days to let us know how her leg is.  If no better, will perform MRI of lumbar spine.    All questions were answered. The patient knows to call the clinic with any problems, questions or concerns. We can certainly see the patient much sooner if necessary.  The patient and plan discussed with Glenford Peers, MD and he is in agreement with the aforementioned.  Danielle [name]

## 2011-11-05 NOTE — Patient Instructions (Addendum)
[facility]  Discharge Instructions  RECOMMENDATIONS MADE BY THE CONSULTANT AND ANY TEST RESULTS WILL BE SENT TO YOUR REFERRING DOCTOR.   EXAM FINDINGS BY MD TODAY AND SIGNS AND SYMPTOMS TO REPORT TO CLINIC OR PRIMARY MD: Exam and discussion per PA.  You are doing well.   MEDICATIONS PRESCRIBED: Aleve take 1 four times daily for the leg discomfort.  If it doesn't improve let us know   INSTRUCTIONS GIVEN AND DISCUSSED: Other :  Report any new lumps, bone pain, shortness of breath, etc.  SPECIAL INSTRUCTIONS/FOLLOW-UP: Return to Clinic tomorrow as scheduled for chemotherapy, to see PA in 3 weeks and Dr. Mariel Sleet in 6 weeks.   I acknowledge that I have been informed and understand all the instructions given to me and received a copy. I do not have any more questions at this time, but understand that I may call the Specialty Clinic at [facility] at [phone] during business hours should I have any further questions or need assistance in obtaining follow-up care.    __________________________________________  _____________  __________ Signature of Patient or Authorized Representative            Date                   Time    __________________________________________ Nurse's Signature

## 2011-11-06 ENCOUNTER — Encounter (HOSPITAL_BASED_OUTPATIENT_CLINIC_OR_DEPARTMENT_OTHER): Payer: Medicaid Other

## 2011-11-06 VITALS — BP 173/91 | HR 71 | Temp 97.6°F | Resp 18 | Wt 179.0 lb

## 2011-11-06 DIAGNOSIS — C773 Secondary and unspecified malignant neoplasm of axilla and upper limb lymph nodes: Secondary | ICD-10-CM

## 2011-11-06 DIAGNOSIS — Z5111 Encounter for antineoplastic chemotherapy: Secondary | ICD-10-CM

## 2011-11-06 DIAGNOSIS — Z452 Encounter for adjustment and management of vascular access device: Secondary | ICD-10-CM

## 2011-11-06 DIAGNOSIS — C50919 Malignant neoplasm of unspecified site of unspecified female breast: Secondary | ICD-10-CM

## 2011-11-06 DIAGNOSIS — C50419 Malignant neoplasm of upper-outer quadrant of unspecified female breast: Secondary | ICD-10-CM

## 2011-11-06 MED ORDER — SODIUM CHLORIDE 0.9 % IV SOLN
Freq: Once | INTRAVENOUS | Status: AC
  Administered 2011-11-06: 8 mg via INTRAVENOUS
  Filled 2011-11-06: qty 4

## 2011-11-06 MED ORDER — SODIUM CHLORIDE 0.9 % IV SOLN: 8.0000 mg | Freq: Once | INTRAVENOUS | Status: DC

## 2011-11-06 MED ORDER — ALTEPLASE 2 MG IJ SOLR
2.0000 mg | Freq: Once | INTRAMUSCULAR | Status: AC | PRN
  Administered 2011-11-06: 2 mg
  Filled 2011-11-06: qty 2

## 2011-11-06 MED ORDER — HEPARIN SOD (PORK) LOCK FLUSH 100 UNIT/ML IV SOLN
INTRAVENOUS | Status: AC
  Filled 2011-11-06: qty 5

## 2011-11-06 MED ORDER — ALTEPLASE 2 MG IJ SOLR
INTRAMUSCULAR | Status: AC
  Filled 2011-11-06: qty 2

## 2011-11-06 MED ORDER — HEPARIN SOD (PORK) LOCK FLUSH 100 UNIT/ML IV SOLN
500.0000 [IU] | Freq: Once | INTRAVENOUS | Status: AC | PRN
  Administered 2011-11-06: 500 [IU]
  Filled 2011-11-06: qty 5

## 2011-11-06 MED ORDER — SODIUM CHLORIDE 0.9 % IJ SOLN
10.0000 mL | INTRAMUSCULAR | Status: DC | PRN
  Administered 2011-11-06: 10 mL
  Filled 2011-11-06: qty 10

## 2011-11-06 MED ORDER — SODIUM CHLORIDE 0.9 % IV SOLN
Freq: Once | INTRAVENOUS | Status: AC
  Administered 2011-11-06: 09:00:00 via INTRAVENOUS

## 2011-11-06 MED ORDER — DEXAMETHASONE SODIUM PHOSPHATE 10 MG/ML IJ SOLN: 10.0000 mg | Freq: Once | INTRAMUSCULAR | Status: DC

## 2011-11-06 MED ORDER — DEXTROSE 5 % IV SOLN
75.0000 mg/m2 | Freq: Once | INTRAVENOUS | Status: AC
  Administered 2011-11-06: 140 mg via INTRAVENOUS
  Filled 2011-11-06: qty 14

## 2011-11-06 NOTE — Progress Notes (Signed)
Tolerated infusion well. Alteplase protocol followed due to inability to get adequate blood return. Port needle capped and taped in place with label.

## 2011-11-07 ENCOUNTER — Encounter (HOSPITAL_BASED_OUTPATIENT_CLINIC_OR_DEPARTMENT_OTHER): Payer: Medicaid Other

## 2011-11-07 VITALS — BP 151/92 | HR 72 | Temp 98.1°F | Resp 18

## 2011-11-07 DIAGNOSIS — Z5189 Encounter for other specified aftercare: Secondary | ICD-10-CM

## 2011-11-07 DIAGNOSIS — C50419 Malignant neoplasm of upper-outer quadrant of unspecified female breast: Secondary | ICD-10-CM

## 2011-11-07 DIAGNOSIS — C773 Secondary and unspecified malignant neoplasm of axilla and upper limb lymph nodes: Secondary | ICD-10-CM

## 2011-11-07 DIAGNOSIS — C50919 Malignant neoplasm of unspecified site of unspecified female breast: Secondary | ICD-10-CM

## 2011-11-07 MED ORDER — HEPARIN SOD (PORK) LOCK FLUSH 100 UNIT/ML IV SOLN
INTRAVENOUS | Status: AC
  Filled 2011-11-07: qty 5

## 2011-11-07 MED ORDER — PEGFILGRASTIM INJECTION 6 MG/0.6ML
6.0000 mg | Freq: Once | SUBCUTANEOUS | Status: AC
  Administered 2011-11-07: 6 mg via SUBCUTANEOUS

## 2011-11-07 MED ORDER — SODIUM CHLORIDE 0.9 % IJ SOLN
INTRAMUSCULAR | Status: AC
  Filled 2011-11-07: qty 10

## 2011-11-07 MED ORDER — PEGFILGRASTIM INJECTION 6 MG/0.6ML
SUBCUTANEOUS | Status: AC
  Filled 2011-11-07: qty 0.6

## 2011-11-07 MED ORDER — HEPARIN SOD (PORK) LOCK FLUSH 100 UNIT/ML IV SOLN
500.0000 [IU] | Freq: Once | INTRAVENOUS | Status: AC
  Administered 2011-11-07: 500 [IU] via INTRAVENOUS
  Filled 2011-11-07: qty 5

## 2011-11-07 MED ORDER — SODIUM CHLORIDE 0.9 % IJ SOLN
10.0000 mL | INTRAMUSCULAR | Status: DC | PRN
  Administered 2011-11-07: 10 mL via INTRAVENOUS
  Filled 2011-11-07: qty 10

## 2011-11-07 NOTE — Progress Notes (Signed)
Danielle [name] presented for Portacath deaccess and removal of alteplase. Proper placement of portacath confirmed by CXR. Portacath located lt chest wall accessed with  H 20 needle. Good blood return present and alteplase removed. Portacath flushed with 20ml NS and 500U/36ml Heparin and needle removed intact. Procedure without incident. Patient tolerated procedure well.  Danielle [name] presents today for injection per MD orders. Neulasta 6mg  administered SQ in left Abdomen. Administration without incident. Patient tolerated well.

## 2011-11-22 ENCOUNTER — Other Ambulatory Visit (HOSPITAL_COMMUNITY): Payer: Self-pay | Admitting: Oncology

## 2011-11-26 ENCOUNTER — Encounter (HOSPITAL_COMMUNITY): Payer: Medicaid Other | Attending: Oncology

## 2011-11-26 DIAGNOSIS — C50919 Malignant neoplasm of unspecified site of unspecified female breast: Secondary | ICD-10-CM | POA: Insufficient documentation

## 2011-11-26 DIAGNOSIS — R609 Edema, unspecified: Secondary | ICD-10-CM | POA: Insufficient documentation

## 2011-11-26 DIAGNOSIS — C50419 Malignant neoplasm of upper-outer quadrant of unspecified female breast: Secondary | ICD-10-CM

## 2011-11-26 LAB — CBC WITH DIFFERENTIAL/PLATELET
Basophils Relative: 1 % (ref 0–1)
Eosinophils Absolute: 0 10*3/uL (ref 0.0–0.7)
Hemoglobin: 11.1 g/dL — ABNORMAL LOW (ref 12.0–15.0)
Lymphs Abs: 1.2 10*3/uL (ref 0.7–4.0)
MCH: 35.2 pg — ABNORMAL HIGH (ref 26.0–34.0)
Monocytes Relative: 12 % (ref 3–12)
Neutro Abs: 4.3 10*3/uL (ref 1.7–7.7)
Neutrophils Relative %: 68 % (ref 43–77)
Platelets: 250 10*3/uL (ref 150–400)
RBC: 3.15 MIL/uL — ABNORMAL LOW (ref 3.87–5.11)

## 2011-11-26 LAB — COMPREHENSIVE METABOLIC PANEL
ALT: 38 U/L — ABNORMAL HIGH (ref 0–35)
Albumin: 3.4 g/dL — ABNORMAL LOW (ref 3.5–5.2)
Alkaline Phosphatase: 61 U/L (ref 39–117)
Chloride: 106 mEq/L (ref 96–112)
GFR calc Af Amer: >90 mL/min (ref >90)
Glucose, Bld: 99 mg/dL (ref 70–99)
Potassium: 4 mEq/L (ref 3.5–5.1)
Sodium: 141 mEq/L (ref 135–145)
Total Bilirubin: 0.3 mg/dL (ref 0.3–1.2)
Total Protein: 6.4 g/dL (ref 6.0–8.3)

## 2011-11-26 NOTE — Progress Notes (Signed)
Labs drawn today for cbc/diff,cmp 

## 2011-11-27 ENCOUNTER — Encounter (HOSPITAL_BASED_OUTPATIENT_CLINIC_OR_DEPARTMENT_OTHER): Payer: Medicaid Other | Admitting: Oncology

## 2011-11-27 ENCOUNTER — Encounter (HOSPITAL_BASED_OUTPATIENT_CLINIC_OR_DEPARTMENT_OTHER): Payer: Medicaid Other

## 2011-11-27 ENCOUNTER — Encounter (HOSPITAL_COMMUNITY): Payer: Self-pay | Admitting: Oncology

## 2011-11-27 VITALS — BP 176/110 | HR 74 | Temp 98.9°F | Resp 18 | Wt 186.2 lb

## 2011-11-27 VITALS — BP 173/94 | HR 73 | Temp 98.4°F | Resp 18

## 2011-11-27 DIAGNOSIS — R600 Localized edema: Secondary | ICD-10-CM

## 2011-11-27 DIAGNOSIS — Z5111 Encounter for antineoplastic chemotherapy: Secondary | ICD-10-CM

## 2011-11-27 DIAGNOSIS — R609 Edema, unspecified: Secondary | ICD-10-CM

## 2011-11-27 DIAGNOSIS — C50419 Malignant neoplasm of upper-outer quadrant of unspecified female breast: Secondary | ICD-10-CM

## 2011-11-27 DIAGNOSIS — C50919 Malignant neoplasm of unspecified site of unspecified female breast: Secondary | ICD-10-CM

## 2011-11-27 DIAGNOSIS — C773 Secondary and unspecified malignant neoplasm of axilla and upper limb lymph nodes: Secondary | ICD-10-CM

## 2011-11-27 MED ORDER — SPIRONOLACTONE 50 MG PO TABS: ORAL_TABLET | ORAL | Status: DC

## 2011-11-27 MED ORDER — DOCETAXEL CHEMO INJECTION 160 MG/16ML
75.0000 mg/m2 | Freq: Once | INTRAVENOUS | Status: AC
  Administered 2011-11-27: 140 mg via INTRAVENOUS
  Filled 2011-11-27: qty 14

## 2011-11-27 MED ORDER — HEPARIN SOD (PORK) LOCK FLUSH 100 UNIT/ML IV SOLN
500.0000 [IU] | Freq: Once | INTRAVENOUS | Status: AC | PRN
  Administered 2011-11-27: 500 [IU]
  Filled 2011-11-27: qty 5

## 2011-11-27 MED ORDER — HEPARIN SOD (PORK) LOCK FLUSH 100 UNIT/ML IV SOLN
INTRAVENOUS | Status: AC
  Filled 2011-11-27: qty 5

## 2011-11-27 MED ORDER — SODIUM CHLORIDE 0.9 % IV SOLN
Freq: Once | INTRAVENOUS | Status: AC
  Administered 2011-11-27: 8 mg via INTRAVENOUS
  Filled 2011-11-27: qty 4

## 2011-11-27 MED ORDER — SODIUM CHLORIDE 0.9 % IV SOLN
Freq: Once | INTRAVENOUS | Status: AC
  Administered 2011-11-27: 09:00:00 via INTRAVENOUS

## 2011-11-27 MED ORDER — DEXAMETHASONE SODIUM PHOSPHATE 10 MG/ML IJ SOLN: 10.0000 mg | Freq: Once | INTRAMUSCULAR | Status: DC

## 2011-11-27 MED ORDER — SODIUM CHLORIDE 0.9 % IJ SOLN
10.0000 mL | INTRAMUSCULAR | Status: DC | PRN
  Administered 2011-11-27: 10 mL
  Filled 2011-11-27: qty 10

## 2011-11-27 MED ORDER — SODIUM CHLORIDE 0.9 % IV SOLN: 8.0000 mg | Freq: Once | INTRAVENOUS | Status: DC

## 2011-11-27 NOTE — Progress Notes (Signed)
Danielle [name]., MD [address] [place] [address]  1. Breast cancer    2. Bilateral leg edema  spironolactone (ALDACTONE) 50 MG tablet    CURRENT THERAPY: S/P cycle 2 (today) of Taxotere every 21 days x 4 cycles. S/P 4 cycles of AC (08/09/2011- 10/16/2011).  S/P right partial mastectomy and axillary node dissection on 06/04/2011 by Dr. Tilford Pillar.   INTERVAL HISTORY: Danielle [name] 56 y.o. female returns for  regular  visit for followup of  Stage II cancer the right breast with 3 of 12 positive nodes and a 3.2 cm primary cancer intermediate grade with extranodal extension of the lymph nodes with LV I. Her cancer was ER positive, PR positive, HER-2/neu not amplified. Her Ki-67 marker was high at 33% and she is now status post 4 cycles of Adriamycin and Cytoxan. Her 2-D echo the other day was very stable. She is now on Taxotere chemotherapy, followed by radiation therapy and then hormonal therapy. Her estrogen receptors were 89% progesterone receptors 50%.  Danielle [name] did not take her Dexamethasone prior to her Taxotere chemotherapy as directed. So she will take the dexamethasone as directed today after chemotherapy.  She was encouraged to follow the dexamethasone instructions.   She reports B/L LE edema that began on Friday.  She is on Lisinopril-HCTZ and is taking that medication.  She reports that she is treating this conservatively with LE elevation.  I will provide her with a short course of diuretics to help with her LE edema.   Unfortunately, Danielle [name] reports 7-8 days of nausea following her last treatment.  She explains that she did not call us in fear that we would admit her to the hospital.  I provided the patient with education regarding nausea.  We spent the majority of our time discussing her anti-emetic regimen.  She had Ativan and Zofran at home.  She may take 1 mg PO every 4 hours PRN nausea and Zofran 8 mg PO every 8 hours PRN nausea if Ativan is ineffective.  She was  encouraged to contact us if that regimen is not effective.  We could certainly give her IV anti-emetic here at the clinic if needed or we can change her regimen via telephone.  She understands this and will not hesitate to call us in the future.    Otherwise, she reports some insomnia and I recommended PO Benadryl or Tylenol PM.  She will try this.  Complete ROS questioning is negative other than the aforementioned.   Past Medical History  Diagnosis Date  . Hypertension   . Cancer   . Borderline hyperlipidemia     managed with diet and exercise  . Breast cancer 08/17/2011  . Gastric ulcer 09/17/2011    has Neutropenic fever; Dehydration; Breast cancer; Vomiting; Hypertension; SIRS (systemic inflammatory response syndrome); C. difficile diarrhea; and Gastric ulcer on her problem list.     is allergic to bactrim and cephalexin.  Danielle [name] had no medications administered during this visit.  Past Surgical History  Procedure Date  . Tubal ligation 1980  . Mastectomy, partial 06/04/2011    Procedure: MASTECTOMY PARTIAL;  Surgeon: Fabio Bering, MD;  Location: [facility];  Service: General;  Laterality: Right;  . Axillary lymph node dissection 06/04/2011    Procedure: AXILLARY LYMPH NODE DISSECTION;  Surgeon: Fabio Bering, MD;  Location: [facility];  Service: General;  Laterality: Right;  . Portacath placement 07/23/2011    Procedure: INSERTION PORT-A-CATH;  Surgeon: Gigi Gin  Leticia Penna, MD;  Location: [facility];  Service: General;  Laterality: Left;  Left Subclavian    Denies any headaches, dizziness, double vision, fevers, chills, night sweats, diarrhea, constipation, chest pain, heart palpitations, shortness of breath, blood in stool, black tarry stool, urinary pain, urinary burning, urinary frequency, hematuria.   PHYSICAL EXAMINATION  ECOG PERFORMANCE STATUS: 1 - Symptomatic but completely ambulatory  Filed Vitals:   11/27/11 1143  BP: 173/94  Pulse: 73  Temp: 98.4 F (36.9 C)  Resp: 18     GENERAL:alert, no distress, well nourished, well developed, comfortable, cooperative, obese and smiling SKIN: skin color, texture, turgor are normal, no rashes or significant lesions HEAD: Normocephalic, No masses, lesions, tenderness or abnormalities EYES: normal, Conjunctiva are pink and non-injected EARS: External ears normal OROPHARYNX:lips, buccal mucosa, and tongue normal and mucous membranes are moist  NECK: supple, trachea midline LYMPH:  no palpable lymphadenopathy, no hepatosplenomegaly BREAST:left breast normal without mass, skin or nipple changes or axillary nodes, right breast with thickened skin and fibroglandular/dense breast tissue.  No distinct lesion identified.  LUNGS: clear to auscultation and percussion HEART: regular rate & rhythm, no murmurs, no gallops, S1 normal and S2 normal ABDOMEN:abdomen soft, non-tender, obese, normal bowel sounds, no masses or organomegaly and no hepatosplenomegaly BACK: Back symmetric, no curvature., No CVA tenderness EXTREMITIES:less then 2 second capillary refill, no joint deformities, effusion, or inflammation, no skin discoloration, no clubbing, no cyanosis, positive findings:  edema B/L LE edema, 1-2+ pitting L >> R  NEURO: alert & oriented x 3 with fluent speech, no focal motor/sensory deficits, gait normal   LABORATORY DATA: CBC    Component Value Date/Time   WBC 6.3 11/26/2011 1000   RBC 3.15* 11/26/2011 1000   HGB 11.1* 11/26/2011 1000   HCT 33.3* 11/26/2011 1000   PLT 250 11/26/2011 1000   MCV 105.7* 11/26/2011 1000   MCH 35.2* 11/26/2011 1000   MCHC 33.3 11/26/2011 1000   RDW 18.1* 11/26/2011 1000   LYMPHSABS 1.2 11/26/2011 1000   MONOABS 0.7 11/26/2011 1000   EOSABS 0.0 11/26/2011 1000   BASOSABS 0.1 11/26/2011 1000      Chemistry      Component Value Date/Time   NA 141 11/26/2011 1000   K 4.0 11/26/2011 1000   CL 106 11/26/2011 1000   CO2 23 11/26/2011 1000   BUN 16 11/26/2011 1000   CREATININE 0.76  11/26/2011 1000      Component Value Date/Time   CALCIUM 8.8 11/26/2011 1000   ALKPHOS 61 11/26/2011 1000   AST 31 11/26/2011 1000   ALT 38* 11/26/2011 1000   BILITOT 0.3 11/26/2011 1000        ASSESSMENT:  1. Stage II cancer the right breast with 3 of 12 positive nodes and a 3.2 cm primary cancer intermediate grade with extranodal extension of the lymph nodes with LV I. Her cancer was ER positive, PR positive, HER-2/neu not amplified. Her Ki-67 marker was high at 33% and she is now status post 4 cycles of Adriamycin and Cytoxan. Her 2-D echo the other day was very stable. She is now progressing on to Taxotere chemotherapy, followed by radiation therapy and then hormonal therapy. Her estrogen receptors were 89% progesterone receptors 50%  2. COPD still smoking a few cigarettes a day   PLAN:  1. 2-D echo scheduled for 11/4 to evaluate EF 2. Pre-chemotherapy lab work ordered: CBC diff, CMET 3. Cycle 3 of Taxotere is scheduled for 12/18/2011. 4. Patient encouraged to take Dexamethasone as prescribed  the day before, day of, and day after therapy.  5. Patient encouraged to try Benadryl PO or Tylenol PM to help with sleep. 6. Long discussion regarding anti-emetic regimen consisting of 1 mg PO every 4 hours PRN nausea and Zofran 8 mg PO every 8 hours PRN nausea if Ativan is ineffective.  She will call us if she needs refills.  7. Spironolactone 50 mg TID x 3 days and then every 8 hours PRN LE edema, #30 with 0 refills.  8. Patient encouraged to continue with elevation of LE 9. Return as scheduled for follow-up  All questions were answered. The patient knows to call the clinic with any problems, questions or concerns. We can certainly see the patient much sooner if necessary.  The patient and plan discussed with Glenford Peers, MD and he is in agreement with the aforementioned.  Mykhia Danish

## 2011-11-27 NOTE — Progress Notes (Signed)
Tolerated chemo well. 

## 2011-11-27 NOTE — Patient Instructions (Addendum)
[facility]  Discharge Instructions  RECOMMENDATIONS MADE BY THE CONSULTANT AND ANY TEST RESULTS WILL BE SENT TO YOUR REFERRING DOCTOR.   EXAM FINDINGS BY MD TODAY AND SIGNS AND SYMPTOMS TO REPORT TO CLINIC OR PRIMARY MD: exam and discussion by PA.  Instructions for after chemotherapy nausea: Take ativan 1 mg every 4 hours as needed for nausea.  Can be swallowed or dissolved under your tongue.  Take the zofran every 8 hours as needed for nausea.  If your nausea does not go away, call us so we can help you.  MEDICATIONS PRESCRIBED: spironolactone 50 mg three times daily for 3 days then as needed. Follow label directions  INSTRUCTIONS GIVEN AND DISCUSSED: Other :  Report uncontrolled nausea and vomiting, fevers, etc.  SPECIAL INSTRUCTIONS/FOLLOW-UP: Return to Clinic as scheduled.   I acknowledge that I have been informed and understand all the instructions given to me and received a copy. I do not have any more questions at this time, but understand that I may call the Specialty Clinic at [facility] at [phone] during business hours should I have any further questions or need assistance in obtaining follow-up care.    __________________________________________  _____________  __________ Signature of Patient or Authorized Representative            Date                   Time    __________________________________________ Nurse's Signature

## 2011-11-28 ENCOUNTER — Encounter (HOSPITAL_BASED_OUTPATIENT_CLINIC_OR_DEPARTMENT_OTHER): Payer: Medicaid Other

## 2011-11-28 VITALS — BP 166/113 | HR 78 | Temp 98.3°F | Resp 18

## 2011-11-28 DIAGNOSIS — C50919 Malignant neoplasm of unspecified site of unspecified female breast: Secondary | ICD-10-CM

## 2011-11-28 DIAGNOSIS — C50419 Malignant neoplasm of upper-outer quadrant of unspecified female breast: Secondary | ICD-10-CM

## 2011-11-28 DIAGNOSIS — C773 Secondary and unspecified malignant neoplasm of axilla and upper limb lymph nodes: Secondary | ICD-10-CM

## 2011-11-28 MED ORDER — PEGFILGRASTIM INJECTION 6 MG/0.6ML
SUBCUTANEOUS | Status: AC
  Filled 2011-11-28: qty 0.6

## 2011-11-28 MED ORDER — PEGFILGRASTIM INJECTION 6 MG/0.6ML
6.0000 mg | Freq: Once | SUBCUTANEOUS | Status: AC
  Administered 2011-11-28: 6 mg via SUBCUTANEOUS

## 2011-11-28 NOTE — Progress Notes (Signed)
Nolen Mu presents today for injection per MD orders. Neulasta 6mg  administered SQ in left Abdomen. Administration without incident. Patient tolerated well.

## 2011-12-17 ENCOUNTER — Ambulatory Visit (HOSPITAL_COMMUNITY): Payer: Medicaid Other

## 2011-12-17 ENCOUNTER — Encounter (HOSPITAL_COMMUNITY): Payer: Medicaid Other | Attending: Oncology

## 2011-12-17 DIAGNOSIS — C50419 Malignant neoplasm of upper-outer quadrant of unspecified female breast: Secondary | ICD-10-CM

## 2011-12-17 DIAGNOSIS — C773 Secondary and unspecified malignant neoplasm of axilla and upper limb lymph nodes: Secondary | ICD-10-CM

## 2011-12-17 DIAGNOSIS — C50919 Malignant neoplasm of unspecified site of unspecified female breast: Secondary | ICD-10-CM | POA: Insufficient documentation

## 2011-12-17 LAB — CBC WITH DIFFERENTIAL/PLATELET
Basophils Absolute: 0.1 10*3/uL (ref 0.0–0.1)
Basophils Relative: 3 % — ABNORMAL HIGH (ref 0–1)
Lymphocytes Relative: 36 % (ref 12–46)
MCHC: 33.9 g/dL (ref 30.0–36.0)
Neutro Abs: 1.6 10*3/uL — ABNORMAL LOW (ref 1.7–7.7)
Neutrophils Relative %: 49 % (ref 43–77)
Platelets: 257 10*3/uL (ref 150–400)
RDW: 15.8 % — ABNORMAL HIGH (ref 11.5–15.5)
WBC: 3.2 10*3/uL — ABNORMAL LOW (ref 4.0–10.5)

## 2011-12-17 LAB — COMPREHENSIVE METABOLIC PANEL
ALT: 85 U/L — ABNORMAL HIGH (ref 0–35)
AST: 72 U/L — ABNORMAL HIGH (ref 0–37)
Albumin: 3.8 g/dL (ref 3.5–5.2)
CO2: 25 mEq/L (ref 19–32)
Calcium: 9.2 mg/dL (ref 8.4–10.5)
Chloride: 104 mEq/L (ref 96–112)
Creatinine, Ser: 0.7 mg/dL (ref 0.50–1.10)
GFR calc non Af Amer: >90 mL/min (ref >90)
Sodium: 140 mEq/L (ref 135–145)
Total Bilirubin: 0.3 mg/dL (ref 0.3–1.2)

## 2011-12-17 NOTE — Progress Notes (Signed)
Labs drawn today for cbc/diff,cmp 

## 2011-12-18 ENCOUNTER — Encounter (HOSPITAL_BASED_OUTPATIENT_CLINIC_OR_DEPARTMENT_OTHER): Payer: Medicaid Other | Admitting: Oncology

## 2011-12-18 ENCOUNTER — Encounter (HOSPITAL_COMMUNITY): Payer: Self-pay | Admitting: Pharmacy Technician

## 2011-12-18 ENCOUNTER — Encounter (HOSPITAL_BASED_OUTPATIENT_CLINIC_OR_DEPARTMENT_OTHER): Payer: Medicaid Other

## 2011-12-18 ENCOUNTER — Encounter (HOSPITAL_COMMUNITY): Payer: Self-pay | Admitting: Oncology

## 2011-12-18 VITALS — BP 169/97 | HR 78 | Temp 98.3°F

## 2011-12-18 VITALS — BP 157/90 | HR 80 | Temp 98.3°F | Resp 18 | Wt 180.0 lb

## 2011-12-18 DIAGNOSIS — Z5111 Encounter for antineoplastic chemotherapy: Secondary | ICD-10-CM

## 2011-12-18 DIAGNOSIS — C773 Secondary and unspecified malignant neoplasm of axilla and upper limb lymph nodes: Secondary | ICD-10-CM

## 2011-12-18 DIAGNOSIS — C50919 Malignant neoplasm of unspecified site of unspecified female breast: Secondary | ICD-10-CM

## 2011-12-18 DIAGNOSIS — J449 Chronic obstructive pulmonary disease, unspecified: Secondary | ICD-10-CM

## 2011-12-18 DIAGNOSIS — C50419 Malignant neoplasm of upper-outer quadrant of unspecified female breast: Secondary | ICD-10-CM

## 2011-12-18 MED ORDER — HEPARIN SOD (PORK) LOCK FLUSH 100 UNIT/ML IV SOLN
INTRAVENOUS | Status: AC
  Filled 2011-12-18: qty 5

## 2011-12-18 MED ORDER — HEPARIN SOD (PORK) LOCK FLUSH 100 UNIT/ML IV SOLN
500.0000 [IU] | Freq: Once | INTRAVENOUS | Status: AC | PRN
  Administered 2011-12-18: 500 [IU]
  Filled 2011-12-18: qty 5

## 2011-12-18 MED ORDER — SODIUM CHLORIDE 0.9 % IJ SOLN
10.0000 mL | INTRAMUSCULAR | Status: DC | PRN
  Filled 2011-12-18: qty 10

## 2011-12-18 MED ORDER — SODIUM CHLORIDE 0.9 % IV SOLN: 8.0000 mg | Freq: Once | INTRAVENOUS | Status: DC

## 2011-12-18 MED ORDER — DOCETAXEL CHEMO INJECTION 160 MG/16ML
75.0000 mg/m2 | Freq: Once | INTRAVENOUS | Status: AC
  Administered 2011-12-18: 140 mg via INTRAVENOUS
  Filled 2011-12-18: qty 14

## 2011-12-18 MED ORDER — DEXAMETHASONE SODIUM PHOSPHATE 10 MG/ML IJ SOLN: 10.0000 mg | Freq: Once | INTRAMUSCULAR | Status: DC

## 2011-12-18 MED ORDER — SODIUM CHLORIDE 0.9 % IV SOLN
Freq: Once | INTRAVENOUS | Status: AC
  Administered 2011-12-18: 09:00:00 via INTRAVENOUS

## 2011-12-18 MED ORDER — AMOXICILLIN-POT CLAVULANATE 875-125 MG PO TABS: 1.0000 | ORAL_TABLET | Freq: Two times a day (BID) | ORAL | Status: DC

## 2011-12-18 MED ORDER — ONDANSETRON HCL 40 MG/20ML IJ SOLN
Freq: Once | INTRAMUSCULAR | Status: AC
  Administered 2011-12-18: 8 mg via INTRAVENOUS
  Filled 2011-12-18: qty 4

## 2011-12-18 NOTE — Progress Notes (Signed)
Problem #1 stage II cancer the right breast with a 3.2 cm primary and 3 of 12 positive nodes all with extranodal extension and LV I. The cancer was ER +89%, progesterone separate +50%, HER-2/neu nonamplified. Ki-67 her course at 33% and she is status post 4 cycles of a.c., and has had 3 cycles of Taxotere of a planned 4. She tells me today that the right breast on becoming red 2 weeks ago. It does not hurt and she has not had fever or chills. Her vital signs are stable without fever. She has no lymphadenopathy in cervical, supraclavicular, infraclavicular, axillary, or inguinal areas. She has no hepatosplenomegaly. Bowel sounds are normal. Heart shows a regular rhythm and rate without murmur rub gallop. Port-A-Cath is intact. Lungs are clear but with diminished breath sounds. Her left breast is totally negative. The right breast slightly swollen but with peau d'orange changes and redness over almost the entire breast. It is not tender to palpation. There is thickening around the biopsy site in the axilla. She is in no acute distress. She is alert and oriented. I have spoke with Dr. Leticia Penna, her surgeon, who is coming here to see her today. I suspect she will need a biopsy of this area. Problem #2 COPD smoking now 4 cigarettes in only 3 days. That is much improved. We will await the consultation from Dr. Leticia Penna. I will tentatively see her in one week

## 2011-12-18 NOTE — Patient Instructions (Addendum)
[facility]  Discharge Instructions  RECOMMENDATIONS MADE BY THE CONSULTANT AND ANY TEST RESULTS WILL BE SENT TO YOUR REFERRING DOCTOR.   EXAM FINDINGS BY MD TODAY AND SIGNS AND SYMPTOMS TO REPORT TO CLINIC OR PRIMARY MD: Exam and discussion by MD.  We will get you scheduled for re-consult with Radiation Oncologist at [facility] with Dr. Thersa Salt.  Dr. Mariel Sleet will talk with Dr. Leticia Penna about the redness in your breast.  Dr. Leticia Penna in and discussed need to do biopsy and he will get you scheduled for skin biopsy hopefully for tomorrow. The scheduler will be in touch with you about the time and location of the biopsy.  Go ahead and start the antibiotic.  MEDICATIONS PRESCRIBED: Augmentin take 1 tablet 2 times daily Follow label directions  INSTRUCTIONS GIVEN AND DISCUSSED: Other :  Report any new lumps, bone pain or shortness of breath.  SPECIAL INSTRUCTIONS/FOLLOW-UP: Return to Clinic in 1 week to see Dr. Mariel Sleet so we can re-examine your breast.   I acknowledge that I have been informed and understand all the instructions given to me and received a copy. I do not have any more questions at this time, but understand that I may call the Specialty Clinic at [facility] at [phone] during business hours should I have any further questions or need assistance in obtaining follow-up care.    __________________________________________  _____________  __________ Signature of Patient or Authorized Representative            Date                   Time    __________________________________________ Nurse's Signature

## 2011-12-18 NOTE — Progress Notes (Signed)
Tolerated well

## 2011-12-19 ENCOUNTER — Encounter (HOSPITAL_COMMUNITY)
Admission: RE | Disposition: A | Discharge status: Home / Self Care | Payer: Self-pay | Source: Ambulatory Visit | Attending: General Surgery

## 2011-12-19 ENCOUNTER — Encounter (HOSPITAL_COMMUNITY): Payer: Self-pay | Admitting: *Deleted

## 2011-12-19 ENCOUNTER — Ambulatory Visit (HOSPITAL_BASED_OUTPATIENT_CLINIC_OR_DEPARTMENT_OTHER): Payer: Medicaid Other

## 2011-12-19 ENCOUNTER — Ambulatory Visit (HOSPITAL_COMMUNITY)
Admission: RE | Admit: 2011-12-19 | Discharge: 2011-12-19 | Disposition: A | Discharge status: Home / Self Care | Payer: Medicaid Other | Source: Ambulatory Visit | Attending: General Surgery | Admitting: General Surgery

## 2011-12-19 VITALS — BP 171/88 | HR 74 | Temp 97.8°F | Resp 20

## 2011-12-19 DIAGNOSIS — L539 Erythematous condition, unspecified: Secondary | ICD-10-CM | POA: Insufficient documentation

## 2011-12-19 DIAGNOSIS — C50919 Malignant neoplasm of unspecified site of unspecified female breast: Secondary | ICD-10-CM

## 2011-12-19 DIAGNOSIS — C773 Secondary and unspecified malignant neoplasm of axilla and upper limb lymph nodes: Secondary | ICD-10-CM

## 2011-12-19 DIAGNOSIS — C50419 Malignant neoplasm of upper-outer quadrant of unspecified female breast: Secondary | ICD-10-CM

## 2011-12-19 DIAGNOSIS — I10 Essential (primary) hypertension: Secondary | ICD-10-CM | POA: Insufficient documentation

## 2011-12-19 DIAGNOSIS — Z853 Personal history of malignant neoplasm of breast: Secondary | ICD-10-CM | POA: Insufficient documentation

## 2011-12-19 HISTORY — PX: SKIN BIOPSY: SHX1

## 2011-12-19 SURGERY — BIOPSY, SKIN
Anesthesia: LOCAL | Site: Breast | Laterality: Right | Wound class: Clean

## 2011-12-19 MED ORDER — HYDROCODONE-ACETAMINOPHEN 5-325 MG PO TABS: 1.0000 | ORAL_TABLET | ORAL | Status: DC | PRN

## 2011-12-19 MED ORDER — PEGFILGRASTIM INJECTION 6 MG/0.6ML
SUBCUTANEOUS | Status: AC
  Filled 2011-12-19: qty 0.6

## 2011-12-19 MED ORDER — LIDOCAINE HCL (PF) 1 % IJ SOLN
INTRAMUSCULAR | Status: AC
  Filled 2011-12-19: qty 30

## 2011-12-19 MED ORDER — PEGFILGRASTIM INJECTION 6 MG/0.6ML
6.0000 mg | Freq: Once | SUBCUTANEOUS | Status: AC
  Administered 2011-12-19: 6 mg via SUBCUTANEOUS

## 2011-12-19 NOTE — Progress Notes (Signed)
Danielle [name] presents today for injection per MD orders. Neulasta 6mg administered SQ in left Abdomen. Administration without incident. Patient tolerated well.  

## 2011-12-19 NOTE — Op Note (Signed)
Patient:  Danielle [name]  DOB:  01-07-1956  MRN:  [id number]   Preop Diagnosis:  Right breast erythema  Postop Diagnosis:  The same  Procedure:  Excisional skin biopsy  Surgeon:  Dr. Tilford Pillar  Anes:1  Percent lidocaine plain  Indications:  Patient is a 56 year old female unknown to me with a history of right-sided breast cancer. She is actually been evaluated in the specialty clinic and was noted to have erythema in the right breast. This had been present for approximately 2 weeks. With her history it was worrisome for this could represent an inflammatory breast cancer. Cellulitis is another possibility however given the findings it was recommended patient undergo an excisional skin biopsy. Her questions and concerns were addressed the patient was consented for planned procedure.  Procedure note:  Patient is taken to the minor procedure room. Her right breast was prepped with chlorhexidine solution and draped in standard fashion.  Local anesthetic was instilled An elliptical incision was created over the erythematous area just medial to the nipple areola complex. The tissue was sent as a perm specimen to pathology. Hemostasis was controlled with skin closure. Skin was reapproximated using interrupted 3-0 Prolene sutures. The drapes removed. Patient tolerated procedure extremely well. All sharps were disposed of in proper accordance.  Complications:  None apparent  EBL:  Minimal  Specimen:  Right breast skin

## 2011-12-19 NOTE — Consult Note (Signed)
Reason for Consult: Right breast erythema Referring Physician: Dr. [name] is an 56 y.o. female.  HPI: Patient has a history of right-sided breast cancer. Was actually being evaluated in the specialty clinic was noted she had increased erythema. Patient states it she developed erythema in the right breast approximately 1-2 weeks ago. It has progressed somewhat starting medially and extending to the lateral aspect of the breast. She denies any pain or discomfort. Prior to that she stated she noted some dry skin over the breast. She's not had any fevers or chills. No significant weight changes. She has not started any radiation therapy. She denied any nipple changes or nipple drainage.  Past Medical History  Diagnosis Date  . Hypertension   . Cancer   . Borderline hyperlipidemia     managed with diet and exercise  . Breast cancer 08/17/2011  . Gastric ulcer 09/17/2011    Past Surgical History  Procedure Date  . Tubal ligation 1980  . Mastectomy, partial 06/04/2011    Procedure: MASTECTOMY PARTIAL;  Surgeon: Danielle Bering, MD;  Location: [facility];  Service: General;  Laterality: Right;  . Axillary lymph node dissection 06/04/2011    Procedure: AXILLARY LYMPH NODE DISSECTION;  Surgeon: Danielle Bering, MD;  Location: [facility];  Service: General;  Laterality: Right;  . Portacath placement 07/23/2011    Procedure: INSERTION PORT-A-CATH;  Surgeon: Danielle Bering, MD;  Location: [facility];  Service: General;  Laterality: Left;  Left Subclavian    Family History  Problem Relation Age of Onset  . Heart disease Mother   . Diabetes Mother   . Heart disease Father   . Diabetes Father   . Cancer Sister     breast cancer  . Anesthesia problems Neg Hx   . Malignant hyperthermia Neg Hx   . Pseudochol deficiency Neg Hx     Social History:  reports that she has been smoking Cigarettes.  She has a 20 pack-year smoking history. She has never used smokeless tobacco. She reports that she drinks  about 16.8 ounces of alcohol per week. She reports that she does not use illicit drugs.  Allergies:  Allergies  Allergen Reactions  . Bactrim (Sulfamethoxazole-Tmp Ds) Nausea Only and Other (See Comments)    Fever and chills along with the nausea.  . Cephalexin Palpitations    Reaction:Heart flutter    Medications:  I have reviewed the patient's current medications. Prior to Admission:  No prescriptions prior to admission   Scheduled:   Continuous:   PRN:    Results for orders placed in visit on 12/17/11 (from the past 48 hour(s))  CBC WITH DIFFERENTIAL     Status: Abnormal   Collection Time   12/17/11  9:16 AM      Component Value Range Comment   WBC 3.2 (*) 4.0 - 10.5 K/uL    RBC 3.55 (*) 3.87 - 5.11 MIL/uL    Hemoglobin 12.8  12.0 - 15.0 g/dL    HCT 16.1  09.6 - 04.5 %    MCV 106.5 (*) 78.0 - 100.0 fL    MCH 36.1 (*) 26.0 - 34.0 pg    MCHC 33.9  30.0 - 36.0 g/dL    RDW 40.9 (*) 81.1 - 15.5 %    Platelets 257  150 - 400 K/uL    Neutrophils Relative 49  43 - 77 %    Neutro Abs 1.6 (*) 1.7 - 7.7 K/uL    Lymphocytes Relative 36  12 - 46 %    Lymphs Abs 1.1  0.7 - 4.0 K/uL    Monocytes Relative 13 (*) 3 - 12 %    Monocytes Absolute 0.4  0.1 - 1.0 K/uL    Eosinophils Relative 0  0 - 5 %    Eosinophils Absolute 0.0  0.0 - 0.7 K/uL    Basophils Relative 3 (*) 0 - 1 %    Basophils Absolute 0.1  0.0 - 0.1 K/uL   COMPREHENSIVE METABOLIC PANEL     Status: Abnormal   Collection Time   12/17/11  9:16 AM      Component Value Range Comment   Sodium 140  135 - 145 mEq/L    Potassium 3.6  3.5 - 5.1 mEq/L    Chloride 104  96 - 112 mEq/L    CO2 25  19 - 32 mEq/L    Glucose, Bld 83  70 - 99 mg/dL    BUN 11  6 - 23 mg/dL    Creatinine, Ser 1.61  0.50 - 1.10 mg/dL    Calcium 9.2  8.4 - 09.6 mg/dL    Total Protein 6.9  6.0 - 8.3 g/dL    Albumin 3.8  3.5 - 5.2 g/dL    AST 72 (*) 0 - 37 U/L    ALT 85 (*) 0 - 35 U/L    Alkaline Phosphatase 64  39 - 117 U/L    Total Bilirubin 0.3   0.3 - 1.2 mg/dL    GFR calc non Af Amer >90  >90 mL/min    GFR calc Af Amer >90  >90 mL/min     No results found.  Review of Systems  Constitutional: Negative.   HENT: Negative.   Eyes: Negative.   Respiratory: Negative.   Cardiovascular: Negative.   Gastrointestinal: Negative.   Genitourinary: Negative.   Musculoskeletal: Negative.   Skin:       Right breast erythema, some swelling  Neurological: Negative.   Endo/Heme/Allergies: Negative.   Psychiatric/Behavioral: Negative.    There were no vitals taken for this visit. Physical Exam  Constitutional: She is oriented to person, place, and time. She appears well-developed and well-nourished. No distress.  HENT:  Head: Normocephalic and atraumatic.  Eyes: Conjunctivae normal and EOM are normal. Pupils are equal, round, and reactive to light. No scleral icterus.  Neck: Normal range of motion. Neck supple. No tracheal deviation present. No thyromegaly present.  Cardiovascular: Normal rate, regular rhythm and normal heart sounds.   Respiratory: Effort normal and breath sounds normal.  GI: Soft. Bowel sounds are normal.  Musculoskeletal: Normal range of motion.  Lymphadenopathy:    She has no cervical adenopathy.  Neurological: She is alert and oriented to person, place, and time.  Skin: Skin is warm and dry.       Right breast demonstrates some edema, erythema around the periareolar region. Her prior lateral incisional scar is well-healed and approximated. There is no tenderness in the breast.    Assessment/Plan: Right breast erythema. 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 She has been started on antibiotics and while this may represents a colitis the benefits of a skin biopsy were discussed with patient. Definitive surgical options will be discussed with patient pending the results of the biopsy if they're warranted. Patient will be  scheduled for biopsy in the minor procedure room tomorrow. Her questions and concerns are addressed.  Danielle [name] 12/19/2011, 7:49  AM

## 2011-12-20 NOTE — H&P (Signed)
Danielle [name] is an 56 y.o. female.  HPI: Patient has a history of right-sided breast cancer. Was actually being evaluated in the specialty clinic was noted she had increased erythema. Patient states it she developed erythema in the right breast approximately 1-2 weeks ago. It has progressed somewhat starting medially and extending to the lateral aspect of the breast. She denies any pain or discomfort. Prior to that she stated she noted some dry skin over the breast. She's not had any fevers or chills. No significant weight changes. She has not started any radiation therapy. She denied any nipple changes or nipple drainage.  Past Medical History   Diagnosis  Date   .  Hypertension    .  Cancer    .  Borderline hyperlipidemia      managed with diet and exercise   .  Breast cancer  08/17/2011   .  Gastric ulcer  09/17/2011    Past Surgical History   Procedure  Date   .  Tubal ligation  1980   .  Mastectomy, partial  06/04/2011     Procedure: MASTECTOMY PARTIAL; Surgeon: Fabio Bering, MD; Location: [facility]; Service: General; Laterality: Right;   .  Axillary lymph node dissection  06/04/2011     Procedure: AXILLARY LYMPH NODE DISSECTION; Surgeon: Fabio Bering, MD; Location: [facility]; Service: General; Laterality: Right;   .  Portacath placement  07/23/2011     Procedure: INSERTION PORT-A-CATH; Surgeon: Fabio Bering, MD; Location: [facility]; Service: General; Laterality: Left; Left Subclavian    Family History   Problem  Relation  Age of Onset   .  Heart disease  Mother    .  Diabetes  Mother    .  Heart disease  Father    .  Diabetes  Father    .  Cancer  Sister       breast cancer    .  Anesthesia problems  Neg Hx    .  Malignant hyperthermia  Neg Hx    .  Pseudochol deficiency  Neg Hx     Social History: reports that she has been smoking Cigarettes. She has a 20 pack-year smoking history. She has never used smokeless tobacco. She reports that she drinks about 16.8 ounces of alcohol per  week. She reports that she does not use illicit drugs.  Allergies:  Allergies   Allergen  Reactions   .  Bactrim (Sulfamethoxazole-Tmp Ds)  Nausea Only and Other (See Comments)     Fever and chills along with the nausea.   .  Cephalexin  Palpitations     Reaction:Heart flutter    Medications:  I have reviewed the patient's current medications.  Prior to Admission:  No prescriptions prior to admission    Scheduled:   Continuous:   PRN:   Results for orders placed in visit on 12/17/11 (from the past 48 hour(s))   CBC WITH DIFFERENTIAL Status: Abnormal    Collection Time    12/17/11 9:16 AM   Component  Value  Range  Comment    WBC  3.2 (*)  4.0 - 10.5 K/uL     RBC  3.55 (*)  3.87 - 5.11 MIL/uL     Hemoglobin  12.8  12.0 - 15.0 g/dL     HCT  16.1  09.6 - 04.5 %     MCV  106.5 (*)  78.0 - 100.0 fL     MCH  36.1 (*)  26.0 -  34.0 pg     MCHC  33.9  30.0 - 36.0 g/dL     RDW  16.1 (*)  09.6 - 15.5 %     Platelets  257  150 - 400 K/uL     Neutrophils Relative  49  43 - 77 %     Neutro Abs  1.6 (*)  1.7 - 7.7 K/uL     Lymphocytes Relative  36  12 - 46 %     Lymphs Abs  1.1  0.7 - 4.0 K/uL     Monocytes Relative  13 (*)  3 - 12 %     Monocytes Absolute  0.4  0.1 - 1.0 K/uL     Eosinophils Relative  0  0 - 5 %     Eosinophils Absolute  0.0  0.0 - 0.7 K/uL     Basophils Relative  3 (*)  0 - 1 %     Basophils Absolute  0.1  0.0 - 0.1 K/uL    COMPREHENSIVE METABOLIC PANEL Status: Abnormal    Collection Time    12/17/11 9:16 AM   Component  Value  Range  Comment    Sodium  140  135 - 145 mEq/L     Potassium  3.6  3.5 - 5.1 mEq/L     Chloride  104  96 - 112 mEq/L     CO2  25  19 - 32 mEq/L     Glucose, Bld  83  70 - 99 mg/dL     BUN  11  6 - 23 mg/dL     Creatinine, Ser  0.45  0.50 - 1.10 mg/dL     Calcium  9.2  8.4 - 10.5 mg/dL     Total Protein  6.9  6.0 - 8.3 g/dL     Albumin  3.8  3.5 - 5.2 g/dL     AST  72 (*)  0 - 37 U/L     ALT  85 (*)  0 - 35 U/L     Alkaline  Phosphatase  64  39 - 117 U/L     Total Bilirubin  0.3  0.3 - 1.2 mg/dL     GFR calc non Af Amer  >90  >90 mL/min     GFR calc Af Amer  >90  >90 mL/min     No results found.  Review of Systems  Constitutional: Negative.  HENT: Negative.  Eyes: Negative.  Respiratory: Negative.  Cardiovascular: Negative.  Gastrointestinal: Negative.  Genitourinary: Negative.  Musculoskeletal: Negative.  Skin:  Right breast erythema, some swelling  Neurological: Negative.  Endo/Heme/Allergies: Negative.  Psychiatric/Behavioral: Negative.   There were no vitals taken for this visit.  Physical Exam  Constitutional: She is oriented to person, place, and time. She appears well-developed and well-nourished. No distress.  HENT:  Head: Normocephalic and atraumatic.  Eyes: Conjunctivae normal and EOM are normal. Pupils are equal, round, and reactive to light. No scleral icterus.  Neck: Normal range of motion. Neck supple. No tracheal deviation present. No thyromegaly present.  Cardiovascular: Normal rate, regular rhythm and normal heart sounds.  Respiratory: Effort normal and breath sounds normal.  GI: Soft. Bowel sounds are normal.  Musculoskeletal: Normal range of motion.  Lymphadenopathy:  She has no cervical adenopathy.  Neurological: She is alert and oriented to person, place, and time.  Skin: Skin is warm and dry.  Right breast demonstrates some edema, erythema around the periareolar region. Her prior lateral incisional scar is  well-healed and approximated. There is no tenderness in the breast.   Assessment/Plan:  Right breast erythema. 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 She has been started on antibiotics and while this may represents a colitis the benefits of a skin biopsy were discussed with patient. Definitive surgical options will be discussed with patient pending the results of the biopsy if  they're warranted. Patient will be scheduled for biopsy in the minor procedure room tomorrow. Her questions and concerns are addressed.  Bleu Minerd C  12/19/2011, 7:49 AM

## 2011-12-21 ENCOUNTER — Encounter (HOSPITAL_COMMUNITY): Payer: Self-pay | Admitting: General Surgery

## 2011-12-25 ENCOUNTER — Ambulatory Visit (HOSPITAL_COMMUNITY)
Admission: RE | Admit: 2011-12-25 | Discharge: 2011-12-25 | Disposition: A | Discharge status: Home / Self Care | Payer: Medicaid Other | Source: Ambulatory Visit | Attending: Oncology | Admitting: Oncology

## 2011-12-25 ENCOUNTER — Telehealth (HOSPITAL_COMMUNITY): Payer: Self-pay

## 2011-12-25 DIAGNOSIS — C50919 Malignant neoplasm of unspecified site of unspecified female breast: Secondary | ICD-10-CM

## 2011-12-25 DIAGNOSIS — I517 Cardiomegaly: Secondary | ICD-10-CM

## 2011-12-25 DIAGNOSIS — I10 Essential (primary) hypertension: Secondary | ICD-10-CM | POA: Insufficient documentation

## 2011-12-25 DIAGNOSIS — Z9221 Personal history of antineoplastic chemotherapy: Secondary | ICD-10-CM | POA: Insufficient documentation

## 2011-12-25 DIAGNOSIS — F17200 Nicotine dependence, unspecified, uncomplicated: Secondary | ICD-10-CM | POA: Insufficient documentation

## 2011-12-25 NOTE — Telephone Encounter (Signed)
Patient notified.  Is having some problems with her eyes since on antibiotic thinks it may be allergies.  Bought some allergy medication and plans to take it tonight.  If eyes are better, she will call and cancel appointment.

## 2011-12-25 NOTE — Telephone Encounter (Signed)
Message copied by Evelena Leyden on Tue Dec 25, 2011  5:42 PM ------      Message from: Mariel Sleet, ERIC S      Created: Tue Dec 25, 2011  4:55 PM       No since it was negative      Proceed with therapy when scheduled      ----- Message -----         From: Evelena Leyden, RN         Sent: 12/25/2011   4:41 PM           To: Randall An, MD            Do you still want to see her tomorrow?      SB      ----- Message -----         From: Randall An, MD         Sent: 12/25/2011   3:44 PM           To: Evelena Leyden, RN            Biopsy was negative       We continue with therapy!

## 2011-12-25 NOTE — Progress Notes (Signed)
*  PRELIMINARY RESULTS* Echocardiogram 2D Echocardiogram has been performed.  Danielle [name] 12/25/2011, 1:52 PM

## 2011-12-26 ENCOUNTER — Ambulatory Visit (HOSPITAL_COMMUNITY): Payer: Medicaid Other | Admitting: Oncology

## 2012-01-07 ENCOUNTER — Encounter (HOSPITAL_BASED_OUTPATIENT_CLINIC_OR_DEPARTMENT_OTHER): Payer: Medicaid Other

## 2012-01-07 DIAGNOSIS — C50419 Malignant neoplasm of upper-outer quadrant of unspecified female breast: Secondary | ICD-10-CM

## 2012-01-07 DIAGNOSIS — C773 Secondary and unspecified malignant neoplasm of axilla and upper limb lymph nodes: Secondary | ICD-10-CM

## 2012-01-07 DIAGNOSIS — C50919 Malignant neoplasm of unspecified site of unspecified female breast: Secondary | ICD-10-CM

## 2012-01-07 LAB — CBC WITH DIFFERENTIAL/PLATELET
Basophils Relative: 2 % — ABNORMAL HIGH (ref 0–1)
Eosinophils Absolute: 0 10*3/uL (ref 0.0–0.7)
Lymphs Abs: 1 10*3/uL (ref 0.7–4.0)
MCH: 36 pg — ABNORMAL HIGH (ref 26.0–34.0)
MCHC: 33.2 g/dL (ref 30.0–36.0)
Neutrophils Relative %: 64 % (ref 43–77)
Platelets: 305 10*3/uL (ref 150–400)
RBC: 3.56 MIL/uL — ABNORMAL LOW (ref 3.87–5.11)

## 2012-01-07 LAB — COMPREHENSIVE METABOLIC PANEL
ALT: 49 U/L — ABNORMAL HIGH (ref 0–35)
Albumin: 3.5 g/dL (ref 3.5–5.2)
Alkaline Phosphatase: 66 U/L (ref 39–117)
Potassium: 4.1 mEq/L (ref 3.5–5.1)
Sodium: 139 mEq/L (ref 135–145)
Total Protein: 6.4 g/dL (ref 6.0–8.3)

## 2012-01-07 NOTE — Progress Notes (Signed)
Labs drawn today for cbc,diff,cmp 

## 2012-01-08 ENCOUNTER — Encounter (HOSPITAL_BASED_OUTPATIENT_CLINIC_OR_DEPARTMENT_OTHER): Payer: Medicaid Other

## 2012-01-08 VITALS — BP 147/79 | HR 93 | Temp 97.5°F | Resp 18 | Wt 185.8 lb

## 2012-01-08 DIAGNOSIS — C50919 Malignant neoplasm of unspecified site of unspecified female breast: Secondary | ICD-10-CM

## 2012-01-08 DIAGNOSIS — Z5111 Encounter for antineoplastic chemotherapy: Secondary | ICD-10-CM

## 2012-01-08 DIAGNOSIS — C773 Secondary and unspecified malignant neoplasm of axilla and upper limb lymph nodes: Secondary | ICD-10-CM

## 2012-01-08 DIAGNOSIS — C50419 Malignant neoplasm of upper-outer quadrant of unspecified female breast: Secondary | ICD-10-CM

## 2012-01-08 MED ORDER — DOCETAXEL CHEMO INJECTION 160 MG/16ML
75.0000 mg/m2 | Freq: Once | INTRAVENOUS | Status: AC
  Administered 2012-01-08: 140 mg via INTRAVENOUS
  Filled 2012-01-08: qty 14

## 2012-01-08 MED ORDER — SODIUM CHLORIDE 0.9 % IV SOLN
Freq: Once | INTRAVENOUS | Status: AC
  Administered 2012-01-08: 8 mg via INTRAVENOUS
  Filled 2012-01-08: qty 4

## 2012-01-08 MED ORDER — SODIUM CHLORIDE 0.9 % IV SOLN
Freq: Once | INTRAVENOUS | Status: AC
  Administered 2012-01-08: 09:00:00 via INTRAVENOUS

## 2012-01-08 MED ORDER — HEPARIN SOD (PORK) LOCK FLUSH 100 UNIT/ML IV SOLN
500.0000 [IU] | Freq: Once | INTRAVENOUS | Status: AC | PRN
  Administered 2012-01-08: 500 [IU]
  Filled 2012-01-08: qty 5

## 2012-01-08 MED ORDER — HEPARIN SOD (PORK) LOCK FLUSH 100 UNIT/ML IV SOLN
INTRAVENOUS | Status: AC
  Filled 2012-01-08: qty 5

## 2012-01-09 ENCOUNTER — Encounter (HOSPITAL_BASED_OUTPATIENT_CLINIC_OR_DEPARTMENT_OTHER): Payer: Medicaid Other

## 2012-01-09 VITALS — BP 157/79 | HR 95 | Temp 97.9°F | Resp 18

## 2012-01-09 DIAGNOSIS — C773 Secondary and unspecified malignant neoplasm of axilla and upper limb lymph nodes: Secondary | ICD-10-CM

## 2012-01-09 DIAGNOSIS — C50919 Malignant neoplasm of unspecified site of unspecified female breast: Secondary | ICD-10-CM

## 2012-01-09 DIAGNOSIS — C50419 Malignant neoplasm of upper-outer quadrant of unspecified female breast: Secondary | ICD-10-CM

## 2012-01-09 MED ORDER — PEGFILGRASTIM INJECTION 6 MG/0.6ML
SUBCUTANEOUS | Status: AC
  Filled 2012-01-09: qty 0.6

## 2012-01-09 MED ORDER — PEGFILGRASTIM INJECTION 6 MG/0.6ML
6.0000 mg | Freq: Once | SUBCUTANEOUS | Status: AC
  Administered 2012-01-09: 6 mg via SUBCUTANEOUS

## 2012-01-09 NOTE — Progress Notes (Signed)
Danielle [name] presents today for injection per MD orders. Neulasta 6mg administered SQ in left Abdomen. Administration without incident. Patient tolerated well.  

## 2012-01-14 ENCOUNTER — Other Ambulatory Visit (HOSPITAL_COMMUNITY): Payer: Self-pay | Admitting: Oncology

## 2012-01-14 ENCOUNTER — Telehealth (HOSPITAL_COMMUNITY): Payer: Self-pay

## 2012-01-14 DIAGNOSIS — B373 Candidiasis of vulva and vagina: Secondary | ICD-10-CM

## 2012-01-14 MED ORDER — FLUCONAZOLE 100 MG PO TABS: ORAL_TABLET | ORAL | Status: DC

## 2012-01-14 NOTE — Telephone Encounter (Signed)
Message left with family member that medication requested by Alona Bene earlier was e-scribed to her pharmacy.

## 2012-01-14 NOTE — Telephone Encounter (Signed)
Call from patient stating, "I think I have a yeast infection and wanted to know if someone would call in something for me."  Denies any fever or other symptoms or vaginal discharge but has intense vaginal itching symptoms began about 3 days ago.  Research scientist (life sciences).

## 2012-01-29 ENCOUNTER — Telehealth (HOSPITAL_COMMUNITY): Payer: Self-pay

## 2012-01-29 NOTE — Telephone Encounter (Signed)
Call from patient with increased fluid in both lower legs. Is taking spironolactone 50 mg daily and does not seem to be helping a lot.  Discussed with Dellis Anes, PA and patient to increase dosage to 50 mg twice daily.  Verbalized understanding.

## 2012-02-01 ENCOUNTER — Encounter (HOSPITAL_COMMUNITY): Payer: Self-pay | Admitting: Oncology

## 2012-02-01 ENCOUNTER — Encounter (HOSPITAL_COMMUNITY): Payer: Medicaid Other | Attending: Oncology | Admitting: Oncology

## 2012-02-01 VITALS — BP 127/77 | HR 75 | Temp 97.6°F | Resp 18 | Wt 186.5 lb

## 2012-02-01 DIAGNOSIS — C773 Secondary and unspecified malignant neoplasm of axilla and upper limb lymph nodes: Secondary | ICD-10-CM

## 2012-02-01 DIAGNOSIS — C50419 Malignant neoplasm of upper-outer quadrant of unspecified female breast: Secondary | ICD-10-CM

## 2012-02-01 DIAGNOSIS — M7989 Other specified soft tissue disorders: Secondary | ICD-10-CM

## 2012-02-01 DIAGNOSIS — L509 Urticaria, unspecified: Secondary | ICD-10-CM

## 2012-02-01 DIAGNOSIS — C50919 Malignant neoplasm of unspecified site of unspecified female breast: Secondary | ICD-10-CM

## 2012-02-01 NOTE — Patient Instructions (Addendum)
[facility] Discharge Instructions  RECOMMENDATIONS MADE BY THE CONSULTANT AND ANY TEST RESULTS WILL BE SENT TO YOUR REFERRING PHYSICIAN.  EXAM FINDINGS BY THE PHYSICIAN TODAY AND SIGNS OR SYMPTOMS TO REPORT TO CLINIC OR PRIMARY PHYSICIAN: Exam and discussion by MD.  Redness should fade once you complete radiation.  We will see if we can get you in to see one of the dermatologist about the persistent, recurring rash.  Continue the spironolactone as ordered.  MEDICATIONS PRESCRIBED:  none  INSTRUCTIONS GIVEN AND DISCUSSED: Report any new lumps, bone pain or shortness of breath.  SPECIAL INSTRUCTIONS/FOLLOW-UP: For follow-up in February after blood work.  Thank you for choosing [facility] to provide your oncology and hematology care.  To afford each patient quality time with our providers, please arrive at least 15 minutes before your scheduled appointment time.  With your help, our goal is to use those 15 minutes to complete the necessary work-up to ensure our physicians have the information they need to help with your evaluation and healthcare recommendations.    Effective January 1st, 2014, we ask that you re-schedule your appointment with our physicians should you arrive 10 or more minutes late for your appointment.  We strive to give you quality time with our providers, and arriving late affects you and other patients whose appointments are after yours.    Again, thank you for choosing [facility].  Our hope is that these requests will decrease the amount of time that you wait before being seen by our physicians.       _____________________________________________________________  I acknowledge that I have been informed and understand all the instructions given to me and received a copy. I do not have anymore questions at this time but understand that I may call the Cancer Center at [facility] at [phone] during business hours should I  have any further questions or need assistance in obtaining follow-up care.    __________________________________________  _____________  __________ Signature of Patient or Authorized Representative            Date                   Time    __________________________________________ Nurse's Signature

## 2012-02-01 NOTE — Progress Notes (Signed)
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 The cancer was ER positive at 89%, PR positive at 50%, HER-2/neu nonamplified. Ki-67 marker was high at 33% and she took 4 cycles of Adriamycin and Cytoxan followed by 4 cycles of Taxotere. She did develop erythema of the breast and did require biopsy to prove that this was not recurrent disease She has already seen Dr. Thersa Salt and will start radiation therapy on 02/08/2012. We will see her back 03/24/2012 to check blood work and to see about starting an aromatase inhibitor soon thereafter. We will need to discuss side effects with her at that time. Problem #2 leg swelling having developed this within the last week and half. She did run out of her hypertensive medication. This does go down during the night but recurs. She has 1+ pitting edema at this time in the pretibial area. She has no tenderness to her calves no erythema, and no cords palpable. Both lower legs appear symmetrical otherwise. Problem #3 COPD still smoking and I have encouraged her to quit once again. Problem #4 urticaria-like rash that she states has been present off and on for 2-3 years. Very pruritic she states over her trunk low back upper thighs upper chest lower abdomen mid abdomen etc. She takes Zyrtec for this without a lot of improvement. She has never seen a dermatologist and we will try to set up an appointment for her. I am not sure we will be able do that since she has only Medicaid. Her review of systems today reveals a swelling mentioned above and the rash which she did not mention until I was in the midst of the physical exam. These are small 5-8 mm areas of redness that are not palpable although she states they are at times. They itch quite a bit she states. She's had these for several years intermittently. The rash does not coincide with any new  medications. Vital signs are stable otherwise. The right breast is still red but without change and has not indurated or obviously infected. She has no lymphadenopathy. Lungs are clear but with markedly diminished breath sounds. Heart shows a regular rhythm and rate. Port is intact. Abdomen soft and nontender without hepatosplenomegaly. She has no arm edema but does have a 1+ pitting pretibial edema mentioned above.  We will see her back in February sooner if need be. She will elevate her legs, continue her blood pressure medication and the spironolactone and if she is not better she will let me know.

## 2012-02-11 ENCOUNTER — Other Ambulatory Visit (HOSPITAL_COMMUNITY): Payer: Self-pay | Admitting: Oncology

## 2012-02-11 DIAGNOSIS — C50919 Malignant neoplasm of unspecified site of unspecified female breast: Secondary | ICD-10-CM

## 2012-02-11 MED ORDER — LORAZEPAM 1 MG PO TABS: 1.0000 mg | ORAL_TABLET | Freq: Four times a day (QID) | ORAL | Status: DC | PRN

## 2012-02-19 ENCOUNTER — Encounter (HOSPITAL_COMMUNITY): Payer: Medicaid Other | Attending: Oncology

## 2012-02-19 DIAGNOSIS — C773 Secondary and unspecified malignant neoplasm of axilla and upper limb lymph nodes: Secondary | ICD-10-CM

## 2012-02-19 DIAGNOSIS — C50919 Malignant neoplasm of unspecified site of unspecified female breast: Secondary | ICD-10-CM | POA: Insufficient documentation

## 2012-02-19 DIAGNOSIS — C50419 Malignant neoplasm of upper-outer quadrant of unspecified female breast: Secondary | ICD-10-CM

## 2012-02-19 LAB — COMPREHENSIVE METABOLIC PANEL
AST: 42 U/L — ABNORMAL HIGH (ref 0–37)
Albumin: 3.7 g/dL (ref 3.5–5.2)
CO2: 27 mEq/L (ref 19–32)
Calcium: 9.5 mg/dL (ref 8.4–10.5)
Creatinine, Ser: 0.8 mg/dL (ref 0.50–1.10)
GFR calc non Af Amer: 80 mL/min — ABNORMAL LOW (ref >90)

## 2012-02-19 LAB — CBC WITH DIFFERENTIAL/PLATELET
Basophils Absolute: 0 10*3/uL (ref 0.0–0.1)
Basophils Relative: 1 % (ref 0–1)
Eosinophils Relative: 2 % (ref 0–5)
HCT: 41.8 % (ref 36.0–46.0)
MCHC: 33.3 g/dL (ref 30.0–36.0)
MCV: 101.2 fL — ABNORMAL HIGH (ref 78.0–100.0)
Monocytes Absolute: 0.4 10*3/uL (ref 0.1–1.0)
RDW: 13.2 % (ref 11.5–15.5)

## 2012-02-19 MED ORDER — HEPARIN SOD (PORK) LOCK FLUSH 100 UNIT/ML IV SOLN
500.0000 [IU] | Freq: Once | INTRAVENOUS | Status: AC
  Administered 2012-02-19: 500 [IU] via INTRAVENOUS
  Filled 2012-02-19: qty 5

## 2012-02-19 MED ORDER — SODIUM CHLORIDE 0.9 % IJ SOLN
20.0000 mL | INTRAMUSCULAR | Status: DC | PRN
  Administered 2012-02-19: 20 mL via INTRAVENOUS
  Filled 2012-02-19: qty 20

## 2012-02-19 MED ORDER — HEPARIN SOD (PORK) LOCK FLUSH 100 UNIT/ML IV SOLN
INTRAVENOUS | Status: AC
  Filled 2012-02-19: qty 5

## 2012-02-19 NOTE — Progress Notes (Signed)
Danielle [name] presented for Portacath access and flush. Proper placement of portacath confirmed by CXR. Portacath located left chest wall accessed with  H 20 needle. Good blood return present. Portacath flushed with 20ml NS and 500U/6ml Heparin and needle removed intact. Procedure without incident. Patient tolerated procedure well.

## 2012-03-24 ENCOUNTER — Other Ambulatory Visit (HOSPITAL_COMMUNITY): Payer: Medicaid Other

## 2012-03-26 ENCOUNTER — Ambulatory Visit (HOSPITAL_COMMUNITY): Payer: Medicaid Other | Admitting: Oncology

## 2012-04-01 ENCOUNTER — Encounter (HOSPITAL_BASED_OUTPATIENT_CLINIC_OR_DEPARTMENT_OTHER): Payer: Medicaid Other | Admitting: Oncology

## 2012-04-01 ENCOUNTER — Encounter (HOSPITAL_COMMUNITY): Payer: Medicaid Other | Attending: Oncology

## 2012-04-01 VITALS — BP 129/79 | HR 81 | Temp 97.7°F | Resp 16 | Wt 181.0 lb

## 2012-04-01 DIAGNOSIS — L501 Idiopathic urticaria: Secondary | ICD-10-CM

## 2012-04-01 DIAGNOSIS — C50919 Malignant neoplasm of unspecified site of unspecified female breast: Secondary | ICD-10-CM | POA: Insufficient documentation

## 2012-04-01 DIAGNOSIS — J4489 Other specified chronic obstructive pulmonary disease: Secondary | ICD-10-CM

## 2012-04-01 DIAGNOSIS — J449 Chronic obstructive pulmonary disease, unspecified: Secondary | ICD-10-CM

## 2012-04-01 DIAGNOSIS — C773 Secondary and unspecified malignant neoplasm of axilla and upper limb lymph nodes: Secondary | ICD-10-CM

## 2012-04-01 DIAGNOSIS — C50419 Malignant neoplasm of upper-outer quadrant of unspecified female breast: Secondary | ICD-10-CM

## 2012-04-01 MED ORDER — ANASTROZOLE 1 MG PO TABS: 1.0000 mg | ORAL_TABLET | Freq: Every day | ORAL | Status: DC

## 2012-04-01 MED ORDER — HEPARIN SOD (PORK) LOCK FLUSH 100 UNIT/ML IV SOLN
500.0000 [IU] | Freq: Once | INTRAVENOUS | Status: AC
  Administered 2012-04-01: 500 [IU] via INTRAVENOUS
  Filled 2012-04-01: qty 5

## 2012-04-01 MED ORDER — SODIUM CHLORIDE 0.9 % IJ SOLN
10.0000 mL | INTRAMUSCULAR | Status: DC | PRN
  Administered 2012-04-01: 10 mL via INTRAVENOUS
  Filled 2012-04-01: qty 10

## 2012-04-01 MED ORDER — HEPARIN SOD (PORK) LOCK FLUSH 100 UNIT/ML IV SOLN
INTRAVENOUS | Status: AC
  Filled 2012-04-01: qty 5

## 2012-04-01 NOTE — Patient Instructions (Addendum)
[facility] Discharge Instructions  RECOMMENDATIONS MADE BY THE CONSULTANT AND ANY TEST RESULTS WILL BE SENT TO YOUR REFERRING PHYSICIAN.  EXAM FINDINGS BY THE PHYSICIAN TODAY AND SIGNS OR SYMPTOMS TO REPORT TO CLINIC OR PRIMARY PHYSICIAN: Exam and discussion by MD.  Bonita Quin are doing well.  Will get a consult with PT for range of motion exercises for your right arm and shoulder.  MEDICATIONS PRESCRIBED:  Arimidex - start on April 21, 2012.  Side Effects discussed by MD.  INSTRUCTIONS GIVEN AND DISCUSSED: Report any new lumps, bone pain or shortness of breath.  SPECIAL INSTRUCTIONS/FOLLOW-UP: Port flush in 6 weeks, blood work in May and to be seen in follow-up after labs in May.  Thank you for choosing [facility] to provide your oncology and hematology care.  To afford each patient quality time with our providers, please arrive at least 15 minutes before your scheduled appointment time.  With your help, our goal is to use those 15 minutes to complete the necessary work-up to ensure our physicians have the information they need to help with your evaluation and healthcare recommendations.    Effective January 1st, 2014, we ask that you re-schedule your appointment with our physicians should you arrive 10 or more minutes late for your appointment.  We strive to give you quality time with our providers, and arriving late affects you and other patients whose appointments are after yours.    Again, thank you for choosing [facility].  Our hope is that these requests will decrease the amount of time that you wait before being seen by our physicians.       _____________________________________________________________  Should you have questions after your visit to [facility], please contact our office at [phone] between the hours of 8:30 a.m. and 5:00 p.m.  Voicemails left after 4:30 p.m. will not be returned until the following business  day.  For prescription refill requests, have your pharmacy contact our office with your prescription refill request.

## 2012-04-01 NOTE — Progress Notes (Signed)
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 Cancer was ER +89%, PR +50%, HER-2/neu nonamplified. Ki-67 marker was high at 33% and received 4 cycles of Adriamycin and Cytoxan followed by 4 cycles of Taxotere, q. 21 days. Her Adriamycin and Cytoxan had to be reduced due to toxicity. She is now about to finish radiation therapy on 04/08/2012. She will then start anastrozole on 04/21/2012 and take this for at least 5 years. We went over in detail the side effects today. She understands them and is ready to proceed. The right breast is very erythematous and tender and mildly swollen due to the radiation therapy. She does not have obvious arm edema the right. She does not have obvious swelling of her legs today either. She is in no acute distress her vital signs are stable. We spent the bulk of the time together discussing the side effects potentially of anastrozole. She knows that we can try other drugs if need be included tamoxifen if the aromatase inhibitors do not suit her well.  #2 COPD and she needs to quit smoking #3 idiopathic urticaria intermittently times many years, unclear as to etiology  We will see her after she has been on that drug for approximately 6 weeks. We'll check her blood work at that time. This will be in may. She knows if she has questions she can call us sooner. She did ask whether not we will scan her again in the answer is not unless she has issues either by history and physical or by routine laboratory work.

## 2012-04-15 ENCOUNTER — Ambulatory Visit (HOSPITAL_COMMUNITY)
Admission: RE | Admit: 2012-04-15 | Discharge: 2012-04-15 | Disposition: A | Discharge status: Home / Self Care | Payer: Medicaid Other | Source: Ambulatory Visit | Attending: Oncology | Admitting: Oncology

## 2012-04-15 DIAGNOSIS — I10 Essential (primary) hypertension: Secondary | ICD-10-CM | POA: Insufficient documentation

## 2012-04-15 DIAGNOSIS — IMO0001 Reserved for inherently not codable concepts without codable children: Secondary | ICD-10-CM | POA: Insufficient documentation

## 2012-04-15 DIAGNOSIS — M6281 Muscle weakness (generalized): Secondary | ICD-10-CM | POA: Insufficient documentation

## 2012-04-15 DIAGNOSIS — M25519 Pain in unspecified shoulder: Secondary | ICD-10-CM | POA: Insufficient documentation

## 2012-04-15 DIAGNOSIS — M6289 Other specified disorders of muscle: Secondary | ICD-10-CM | POA: Insufficient documentation

## 2012-04-15 NOTE — Evaluation (Cosign Needed)
Occupational Therapy Evaluation  Patient Details  Name: Danielle [name] MRN: [id number] Date of Birth: [birth year]  Today's Date: 04/15/2012 Time: 1100-1145 OT Time Calculation (min): 45 min OT Evaluation 45' Visit#: 1 of 4  Re-eval: 05/13/12   Diagnosis: Right Arm and Shoulder Pain Surgical Date: 05/14/11 Prior Therapy: none  Authorization: Medicaid requesting 3 visits, first visit eval, does not count toward 3 visits.  Authorization Time Period: 04/15/12-05/13/12  Authorization Visit#: 0 of 3   Past Medical History:  Past Medical History  Diagnosis Date  . Hypertension   . Cancer   . Borderline hyperlipidemia     managed with diet and exercise  . Breast cancer 08/17/2011  . Gastric ulcer 09/17/2011   Past Surgical History:  Past Surgical History  Procedure Laterality Date  . Tubal ligation  1980  . Mastectomy, partial  06/04/2011    Procedure: MASTECTOMY PARTIAL;  Surgeon: Fabio Bering, MD;  Location: [facility];  Service: General;  Laterality: Right;  . Axillary lymph node dissection  06/04/2011    Procedure: AXILLARY LYMPH NODE DISSECTION;  Surgeon: Fabio Bering, MD;  Location: [facility];  Service: General;  Laterality: Right;  . Portacath placement  07/23/2011    Procedure: INSERTION PORT-A-CATH;  Surgeon: Fabio Bering, MD;  Location: [facility];  Service: General;  Laterality: Left;  Left Subclavian  . Skin biopsy  12/19/2011    Procedure: BIOPSY SKIN;  Surgeon: Fabio Bering, MD;  Location: [facility];  Service: General;  Laterality: Right;  In Minor Room    Subjective S:  Its really the arm pit area and down the back of my arm that is sore.   Pertinent History: Danielle [name] was diagnosed with right breast cancer in April 2013.  She had a lumpectomy, 12 lymph nodes removed, chemotherapy, and radiation.  After her surgery, she began to experience increased pain in her right axillary area and decreased mobiity.  Dr. Mariel Sleet has referred her to occupational therapy for evaluation and  treatment for right arm and shoulder pain. Special Tests: DASH scored 38 with ideal score being 0. Patient Stated Goals: I want to be able to reach over my head without my arm hurting.  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  Must be able to reach overhead to retrieve cigarette packs for customers and lift cases of beer. Leisure: Hobbies-yes (Comment) Comments: enjoys gardening and has not gardened since her diagnosis  Assessment ADL/Vision/Perception ADL ADL Comments: Activities that involve reaching overhead such as retrieving cigarettes, combing her hair, reaching into overhead cabinet are painful.  She is not able to lift anything heavy with her right arm. Dominant Hand: Right  Cognition/Observation Cognition Overall Cognitive Status: Appears within functional limits for tasks assessed  Sensation/Coordination/Edema Sensation Light Touch: Appears Intact Coordination Gross Motor Movements are Fluid and Coordinated: Yes Fine Motor Movements are Fluid and Coordinated: Yes  Additional Assessments RUE AROM (degrees) RUE Overall AROM Comments: assessed in seated ER/IR with shoulder abducted to 90 Right Shoulder Flexion: 90 Degrees Right Shoulder ABduction: 90 Degrees Right Shoulder Internal Rotation: 75 Degrees Right Shoulder External Rotation: 75 Degrees RUE PROM (degrees) RUE Overall PROM Comments: assessed in seated ER/IR with shoulder abducted to 90 Right  Shoulder Flexion: 135 Degrees Right Shoulder ABduction: 110 Degrees Right Shoulder Internal Rotation: 75 Degrees Right Shoulder External Rotation: 75 Degrees RUE Strength RUE Overall  Strength Comments: assessed in seated Right Shoulder Flexion:  (4-/5) Right Shoulder ABduction:  (4-/5) Right Shoulder Internal Rotation:  (4-/5) Right Shoulder External Rotation:  (4-/5) Palpation Palpation: Mod-max fascial restrictions noted in scapular and trapezius region, upper arm, and shoulder region.  Axillary area is tender to palpation and tissue is pliable with minimal restrictions     Exercise/Treatments     Manual Therapy Manual Therapy: Myofascial release Myofascial Release: MFR and manual stretching to right shoulder, scapular, and axial region to assess restrictions and to decrease pain and restrictions and increase pain free mobility needed for greater independence with daily activities.   Occupational Therapy Assessment and Plan OT Assessment and Plan Clinical Impression Statement: A:  Danielle [name] is a 57 year old s/p lumpectomy and lymph node removal with right shoulder and arm pain.  Her pain is limiting her mobility and ability to participate in all desired daily activities.  Skilled OT intervention is indicated to decrease fascial and scar restrictions and increase pain free mobility in her right arm and shoulder region needed to resume her prior level of I with all B/IADLs, work, and leisure activities. Pt will benefit from skilled therapeutic intervention in order to improve on the following deficits: Decreased strength;Increased muscle spasms;Increased fascial restricitons;Decreased range of motion;Pain Rehab Potential: Good OT Frequency: Min 1X/week OT Treatment/Interventions: Self-care/ADL training;Therapeutic exercise;Manual therapy;Modalities;Therapeutic activities;Patient/family education OT Plan: P:  Skilled OT intervention to decrease pain and restrictions and increase pain free mobility needed to use her right upper extremity as dominant with all daily activities.  Treatment Plan:  MFR and manual stretching to right shoulder, scapular, and axillary region.  AAROM  and PROM in supine, progressing to AROM.  ball stretches, AROM ext, ret, row, wall wash, thumbtacks, prot/ret//elev/dep, pulleys, progress as tolerated.     Goals Short Term Goals Time to Complete Short Term Goals: 2 weeks Short Term Goal 1: Patient will be educated on a HEP. Short Term Goal 2: Patient will increase right shoulder PROM to [facility] for increased ability to reach overhead and retrieve cigarette packs at work. Short Term Goal 3: Patient will increase right shoulder strength to 4/5 for increased abiilty to lift pots and pans when cooking.  Short Term Goal 4: Patient will decrease pain level to 3/10 in her right shoulder and axillary region for increased comfort while sleeping.  Short Term Goal 5: Patient will decrease fascial restrictions from moderate to min-mod in her right shoulder, scapular, axillary region for increased mobility needed for increased I with daily activities.  Long Term Goals Time to Complete Long Term Goals: 4 weeks Long Term Goal 1: Patient will be able to use her right arm as normal with all functional activities . Long Term Goal 2: Patient will increase her right shoulder AROM to WNL in order to reach overhead and retrieve cigarette packs at work. Long Term Goal 3: Patient will increase her right shoulder strength to 5/5 for increased ability to lift cases of beer and soda at work. Long Term Goal 4: Patient will decrease her right shoulder pain level to 1/10 when completing daily activities.  Long Term Goal 5: Patient will decrease fascial restrictions in her right shoulder region to minimal for greater mobility needed for increased participation with daily activities at home and work.   Problem List Patient Active Problem List  Diagnosis  .  Neutropenic fever  . Dehydration  . Breast cancer  . Vomiting  . Hypertension  . SIRS (systemic inflammatory response syndrome)  . C. difficile diarrhea  . Gastric ulcer  . Pain in joint, shoulder region  . Muscle  tightness    End of Session Activity Tolerance: Patient tolerated treatment well General Behavior During Session: [facility] for tasks performed Cognition: [facility] for tasks performed OT Plan of Care OT Home Exercise Plan: Educated patient on HEP for shoulder and cervical stretches and towel slides.  Patient was given verbal, visual, and written instructions and was able to return demonstrate each exercise. Consulted and Agree with Plan of Care: Patient   Shirlean Mylar, OTR/L  04/15/2012, 2:20 PM  Physician Documentation Your signature is required to indicate approval of the treatment plan as stated above.  Please sign and either send electronically or make a copy of this report for your files and return this physician signed original.  Please mark one 1.__approve of plan  2. ___approve of plan with the following conditions.   ______________________________                                                          _____________________ Physician Signature                                                                                                             Date

## 2012-04-22 ENCOUNTER — Ambulatory Visit (HOSPITAL_COMMUNITY): Payer: Medicaid Other | Admitting: Specialist

## 2012-04-29 ENCOUNTER — Ambulatory Visit (HOSPITAL_COMMUNITY)
Admission: RE | Admit: 2012-04-29 | Discharge: 2012-04-29 | Disposition: A | Discharge status: Home / Self Care | Payer: Medicaid Other | Source: Ambulatory Visit | Attending: Oncology | Admitting: Oncology

## 2012-04-29 DIAGNOSIS — M25511 Pain in right shoulder: Secondary | ICD-10-CM

## 2012-04-29 DIAGNOSIS — M6289 Other specified disorders of muscle: Secondary | ICD-10-CM

## 2012-04-29 NOTE — Progress Notes (Signed)
Occupational Therapy Treatment Patient Details  Name: Danielle [name] MRN: [id number] Date of Birth: 13-May-1955  Today's Date: 04/29/2012 Time: 4098-1191 OT Time Calculation (min): 36 min Manual Therapy 1019-1040 21' Therapeutic Exercises 1040-1055 15' Visit#: 2 of 4  Re-eval: 05/13/12    Authorization: Medicaid requesting 3 visits, first visit eval, does not count toward 3 visits.  Authorization Time Period: 04/15/12-05/13/12  Authorization Visit#: 1 of 3  Subjective S:  It feels alot better, I think the exercises are really helping! Pain Assessment Currently in Pain?: Yes Pain Score:   2 Pain Location: Shoulder Pain Orientation: Right Pain Type: Acute pain  Precautions/Restrictions   progress as tolerated  Exercise/Treatments Supine Protraction: PROM;AAROM;10 reps Horizontal ABduction: PROM;AAROM;10 reps External Rotation: PROM;AAROM;10 reps Internal Rotation: PROM;AAROM;10 reps Flexion: PROM;AAROM;10 reps ABduction: PROM;AAROM;10 reps Seated Elevation: AROM;10 reps Extension: AROM;10 reps Row: AROM;10 reps Therapy Ball Flexion: 20 reps ABduction: 20 reps Right/Left: 5 reps    Manual Therapy Manual Therapy: Myofascial release Myofascial Release: MFR and manual stretching to right shoulder, scapular, and axial region to decrease pain and restrictions and increase pain free mobility needed for greater independence with daily activities.  Occupational Therapy Assessment and Plan OT Assessment and Plan Clinical Impression Statement: A:  Patient had significant improvement in pain free PROM, therefore added AAROM exercises in supine to therapeutic exercises and to HEP. OT Plan: P:  Add AAROM in seated, add prot/ret//elev/dep and thumbtack exercises to increase scapular stability to improve shoulder alignment and decrease pain.     Goals Short Term Goals Time to Complete Short Term Goals: 2 weeks Short Term Goal 1: Patient will be educated on a HEP. Short Term Goal 1  Progress: Met Short Term Goal 2: Patient will increase right shoulder PROM to [facility] for increased ability to reach overhead and retrieve cigarette packs at work. Short Term Goal 2 Progress: Progressing toward goal Short Term Goal 3: Patient will increase right shoulder strength to 4/5 for increased abiilty to lift pots and pans when cooking.  Short Term Goal 3 Progress: Progressing toward goal Short Term Goal 4: Patient will decrease pain level to 3/10 in her right shoulder and axillary region for increased comfort while sleeping.  Short Term Goal 4 Progress: Progressing toward goal Short Term Goal 5: Patient will decrease fascial restrictions from moderate to min-mod in her right shoulder, scapular, axillary region for increased mobility needed for increased I with daily activities.  Short Term Goal 5 Progress: Progressing toward goal Long Term Goals Time to Complete Long Term Goals: 4 weeks Long Term Goal 1: Patient will be able to use her right arm as normal with all functional activities . Long Term Goal 1 Progress: Progressing toward goal Long Term Goal 2: Patient will increase her right shoulder AROM to WNL in order to reach overhead and retrieve cigarette packs at work. Long Term Goal 2 Progress: Progressing toward goal Long Term Goal 3: Patient will increase her right shoulder strength to 5/5 for increased ability to lift cases of beer and soda at work. Long Term Goal 3 Progress: Progressing toward goal Long Term Goal 4: Patient will decrease her right shoulder pain level to 1/10 when completing daily activities.  Long Term Goal 4 Progress: Progressing toward goal Long Term Goal 5: Patient will decrease fascial restrictions in her right shoulder region to minimal for greater mobility needed for increased participation with daily activities at home and work.  Long Term Goal 5 Progress: Progressing toward goal  Problem List Patient Active  Problem List  Diagnosis  . Neutropenic fever  .  Dehydration  . Breast cancer  . Vomiting  . Hypertension  . SIRS (systemic inflammatory response syndrome)  . C. difficile diarrhea  . Gastric ulcer  . Pain in joint, shoulder region  . Muscle tightness    End of Session Activity Tolerance: Patient tolerated treatment well General Behavior During Session: [facility] for tasks performed Cognition: [facility] for tasks performed OT Plan of Care OT Home Exercise Plan: Educated patient on HEP for AAROM exercises in supine, provided written, pictoral, and oral instruction.    Shirlean Mylar, OTR/L  04/29/2012, 10:57 AM

## 2012-05-06 ENCOUNTER — Inpatient Hospital Stay (HOSPITAL_COMMUNITY): Admission: RE | Admit: 2012-05-06 | Payer: Medicaid Other | Source: Ambulatory Visit | Admitting: Occupational Therapy

## 2012-05-08 ENCOUNTER — Other Ambulatory Visit (HOSPITAL_COMMUNITY): Payer: Self-pay | Admitting: Oncology

## 2012-05-13 ENCOUNTER — Encounter (HOSPITAL_COMMUNITY): Payer: Self-pay

## 2012-05-13 ENCOUNTER — Encounter (HOSPITAL_COMMUNITY): Payer: Medicaid Other | Attending: Oncology

## 2012-05-13 DIAGNOSIS — C50919 Malignant neoplasm of unspecified site of unspecified female breast: Secondary | ICD-10-CM | POA: Insufficient documentation

## 2012-05-13 DIAGNOSIS — J069 Acute upper respiratory infection, unspecified: Secondary | ICD-10-CM | POA: Insufficient documentation

## 2012-05-13 DIAGNOSIS — Z9889 Other specified postprocedural states: Secondary | ICD-10-CM | POA: Insufficient documentation

## 2012-05-13 DIAGNOSIS — C50419 Malignant neoplasm of upper-outer quadrant of unspecified female breast: Secondary | ICD-10-CM

## 2012-05-13 DIAGNOSIS — Z95828 Presence of other vascular implants and grafts: Secondary | ICD-10-CM

## 2012-05-13 DIAGNOSIS — C773 Secondary and unspecified malignant neoplasm of axilla and upper limb lymph nodes: Secondary | ICD-10-CM

## 2012-05-13 DIAGNOSIS — Z452 Encounter for adjustment and management of vascular access device: Secondary | ICD-10-CM

## 2012-05-13 DIAGNOSIS — M79609 Pain in unspecified limb: Secondary | ICD-10-CM | POA: Insufficient documentation

## 2012-05-13 HISTORY — DX: Presence of other vascular implants and grafts: Z95.828

## 2012-05-13 MED ORDER — HEPARIN SOD (PORK) LOCK FLUSH 100 UNIT/ML IV SOLN
500.0000 [IU] | Freq: Once | INTRAVENOUS | Status: AC
  Administered 2012-05-13: 500 [IU] via INTRAVENOUS
  Filled 2012-05-13: qty 5

## 2012-05-13 MED ORDER — HEPARIN SOD (PORK) LOCK FLUSH 100 UNIT/ML IV SOLN
INTRAVENOUS | Status: AC
  Filled 2012-05-13: qty 5

## 2012-05-13 MED ORDER — SODIUM CHLORIDE 0.9 % IJ SOLN
10.0000 mL | INTRAMUSCULAR | Status: DC | PRN
  Administered 2012-05-13: 10 mL via INTRAVENOUS
  Filled 2012-05-13: qty 10

## 2012-05-13 NOTE — Progress Notes (Signed)
Danielle [name] presented for Portacath access and flush. Proper placement of portacath confirmed by CXR. Portacath located left  chest wall accessed with  H 20 needle. Good blood return present. Portacath flushed with 20ml NS and 500U/7ml Heparin and needle removed intact. Procedure without incident. Patient tolerated procedure well.

## 2012-05-27 ENCOUNTER — Encounter (HOSPITAL_BASED_OUTPATIENT_CLINIC_OR_DEPARTMENT_OTHER): Payer: Medicaid Other

## 2012-05-27 DIAGNOSIS — C50919 Malignant neoplasm of unspecified site of unspecified female breast: Secondary | ICD-10-CM

## 2012-05-27 DIAGNOSIS — C50419 Malignant neoplasm of upper-outer quadrant of unspecified female breast: Secondary | ICD-10-CM

## 2012-05-27 DIAGNOSIS — C773 Secondary and unspecified malignant neoplasm of axilla and upper limb lymph nodes: Secondary | ICD-10-CM

## 2012-05-27 LAB — COMPREHENSIVE METABOLIC PANEL
ALT: 71 U/L — ABNORMAL HIGH (ref 0–35)
Alkaline Phosphatase: 75 U/L (ref 39–117)
BUN: 16 mg/dL (ref 6–23)
CO2: 26 mEq/L (ref 19–32)
Calcium: 9.6 mg/dL (ref 8.4–10.5)
GFR calc Af Amer: >90 mL/min (ref >90)
GFR calc non Af Amer: >90 mL/min (ref >90)
Glucose, Bld: 109 mg/dL — ABNORMAL HIGH (ref 70–99)
Potassium: 3.9 mEq/L (ref 3.5–5.1)
Sodium: 138 mEq/L (ref 135–145)
Total Protein: 7.3 g/dL (ref 6.0–8.3)

## 2012-05-27 LAB — CBC WITH DIFFERENTIAL/PLATELET
Eosinophils Absolute: 0.1 10*3/uL (ref 0.0–0.7)
Hemoglobin: 14.1 g/dL (ref 12.0–15.0)
Lymphocytes Relative: 19 % (ref 12–46)
Lymphs Abs: 1.7 10*3/uL (ref 0.7–4.0)
MCH: 32.4 pg (ref 26.0–34.0)
Monocytes Relative: 9 % (ref 3–12)
Neutro Abs: 6.4 10*3/uL (ref 1.7–7.7)
Neutrophils Relative %: 71 % (ref 43–77)
RBC: 4.35 MIL/uL (ref 3.87–5.11)
WBC: 9 10*3/uL (ref 4.0–10.5)

## 2012-05-27 NOTE — Progress Notes (Signed)
Labs drawn today for cbc/diff,cmp 

## 2012-05-28 ENCOUNTER — Encounter (HOSPITAL_COMMUNITY): Payer: Self-pay | Admitting: Oncology

## 2012-05-28 ENCOUNTER — Other Ambulatory Visit (HOSPITAL_COMMUNITY): Payer: Self-pay | Admitting: Oncology

## 2012-05-28 ENCOUNTER — Encounter (HOSPITAL_BASED_OUTPATIENT_CLINIC_OR_DEPARTMENT_OTHER): Payer: Medicaid Other | Admitting: Oncology

## 2012-05-28 ENCOUNTER — Ambulatory Visit (HOSPITAL_COMMUNITY)
Admission: RE | Admit: 2012-05-28 | Discharge: 2012-05-28 | Disposition: A | Discharge status: Home / Self Care | Payer: Medicaid Other | Source: Ambulatory Visit | Attending: Oncology | Admitting: Oncology

## 2012-05-28 VITALS — BP 150/86 | HR 84 | Temp 98.6°F | Resp 18 | Wt 181.6 lb

## 2012-05-28 DIAGNOSIS — C50419 Malignant neoplasm of upper-outer quadrant of unspecified female breast: Secondary | ICD-10-CM

## 2012-05-28 DIAGNOSIS — M79671 Pain in right foot: Secondary | ICD-10-CM

## 2012-05-28 DIAGNOSIS — C50911 Malignant neoplasm of unspecified site of right female breast: Secondary | ICD-10-CM

## 2012-05-28 DIAGNOSIS — M21619 Bunion of unspecified foot: Secondary | ICD-10-CM | POA: Insufficient documentation

## 2012-05-28 DIAGNOSIS — M79609 Pain in unspecified limb: Secondary | ICD-10-CM

## 2012-05-28 DIAGNOSIS — J069 Acute upper respiratory infection, unspecified: Secondary | ICD-10-CM

## 2012-05-28 DIAGNOSIS — Z170 Estrogen receptor positive status [ER+]: Secondary | ICD-10-CM

## 2012-05-28 DIAGNOSIS — C773 Secondary and unspecified malignant neoplasm of axilla and upper limb lymph nodes: Secondary | ICD-10-CM

## 2012-05-28 MED ORDER — AZITHROMYCIN 250 MG PO TABS: ORAL_TABLET | ORAL | Status: DC

## 2012-05-28 NOTE — Patient Instructions (Addendum)
[facility] Discharge Instructions  RECOMMENDATIONS MADE BY THE CONSULTANT AND ANY TEST RESULTS WILL BE SENT TO YOUR REFERRING PHYSICIAN.  EXAM FINDINGS BY THE PHYSICIAN TODAY AND SIGNS OR SYMPTOMS TO REPORT TO CLINIC OR PRIMARY PHYSICIAN: exam and discussion by PA.  Will check some blood work today and have you go by X-ray to have films done of your feet on your way out.  MEDICATIONS PRESCRIBED:  Z-Pak take as directed.  INSTRUCTIONS GIVEN AND DISCUSSED: Report any new lumps, bone pain, shortness of breath or other symptoms.  SPECIAL INSTRUCTIONS/FOLLOW-UP: Follow-up in 1 week to see PA.  Thank you for choosing [facility] to provide your oncology and hematology care.  To afford each patient quality time with our providers, please arrive at least 15 minutes before your scheduled appointment time.  With your help, our goal is to use those 15 minutes to complete the necessary work-up to ensure our physicians have the information they need to help with your evaluation and healthcare recommendations.    Effective January 1st, 2014, we ask that you re-schedule your appointment with our physicians should you arrive 10 or more minutes late for your appointment.  We strive to give you quality time with our providers, and arriving late affects you and other patients whose appointments are after yours.    Again, thank you for choosing [facility].  Our hope is that these requests will decrease the amount of time that you wait before being seen by our physicians.       _____________________________________________________________  Should you have questions after your visit to [facility], please contact our office at [phone] between the hours of 8:30 a.m. and 5:00 p.m.  Voicemails left after 4:30 p.m. will not be returned until the following business day.  For prescription refill requests, have your pharmacy contact our office with your  prescription refill request.

## 2012-05-28 NOTE — Progress Notes (Addendum)
Danielle Smiles., MD [address] [place] [address]  Breast cancer, right  CURRENT THERAPY:Anastrozole daily beginning on 04/21/2012 and she will take that for at least 5 years.   INTERVAL HISTORY: Danielle [name] 57 y.o. 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 Cancer was ER +89%, PR +50%, HER-2/neu nonamplified. Ki-67 marker was high at 33% and received 4 cycles of Adriamycin and Cytoxan (08/09/2011- 10/16/2011) followed by 4 cycles of Taxotere q21 days (11/06/2011- 01/08/2012). Her Adriamycin and Cytoxan had to be reduced due to toxicity. She is now S/P radiation therapy finishing on 04/08/2012. Now on Anastrozole starting on 04/21/2012 and take this for at least 5 years.  [name] was scheduled for a May appointment but she called and had her scheduled appointment changed today.  [name] reports a 4 week history of "foot pain." She reports it is a 10 out of 10 particularly at night on a pain scale. She denies any trauma or falling. She denies any sprain ankles. She does admit that she injured her right foot as a child. She describes the pain as sharp and intense. It is intermittent. It resolves spontaneously. She has tried ibuprofen which is effective for her for a short time (less than one hour). She reports that the pain is on the anterior aspect of foot. He is in the lateral side of foot as well. She reports her primary care physician who tried her on a course of Neurontin and reported that it was secondary from chemotherapy. The patient could not tolerate the Neurontin and quit after 3 days Worth of taking this medication. This does not sound like chemotherapy induced peripheral neuropathy as it mainly effects only the right foot.  She also reports an upper respiratory tract infection. She admits to nasal  discharge that is Green in color. She admits to a headache in the morning. She denies any fevers or chills.  She admits to a 7 day history of green nasal discharge.  She is tolerating the anastrozole well. She denies any hot flashes, arthralgias, and myalgias. The anastrozole is not likely contributing to the aforementioned symptoms.  Oncologically, she denies any complaints and ROS questioning is negative.    Past Medical History  Diagnosis Date  . Hypertension   . Cancer   . Borderline hyperlipidemia     managed with diet and exercise  . Breast cancer 08/17/2011  . Gastric ulcer 09/17/2011  . Port catheter in place 05/13/2012    has Breast cancer; Hypertension; Gastric ulcer; Pain in joint, shoulder region; Muscle tightness; and Port catheter in place on her problem list.     is allergic to bactrim and cephalexin.  Ms. Cerone does not currently have medications on file.  Past Surgical History  Procedure Laterality Date  . Tubal ligation  1980  . Mastectomy, partial  06/04/2011    Procedure: MASTECTOMY PARTIAL;  Surgeon: Fabio Bering, MD;  Location: [facility];  Service: General;  Laterality: Right;  . Axillary lymph node dissection  06/04/2011    Procedure: AXILLARY LYMPH NODE DISSECTION;  Surgeon: Fabio Bering, MD;  Location: [facility];  Service: General;  Laterality: Right;  . Portacath placement  07/23/2011    Procedure: INSERTION PORT-A-CATH;  Surgeon: Fabio Bering, MD;  Location: [facility];  Service: General;  Laterality: Left;  Left Subclavian  . Skin biopsy  12/19/2011    Procedure: BIOPSY SKIN;  Surgeon: Fabio Bering, MD;  Location: [facility];  Service: General;  Laterality: Right;  In Minor Room    Denies any headaches, dizziness, double vision, fevers, chills, night sweats, nausea, vomiting, diarrhea, constipation, chest pain, heart palpitations, shortness of breath, blood in stool, black tarry stool, urinary pain, urinary burning, urinary frequency, hematuria.   PHYSICAL  EXAMINATION  ECOG PERFORMANCE STATUS: 2 - Symptomatic, <50% confined to bed  Filed Vitals:   05/28/12 1325  BP: 150/86  Pulse: 84  Temp: 98.6 F (37 C)  Resp: 18    GENERAL:alert, no distress, well nourished, well developed, comfortable, cooperative and smiling SKIN: skin color, texture, turgor are normal, no rashes or significant lesions HEAD: Normocephalic, No masses, lesions, tenderness or abnormalities EYES: normal, Conjunctiva are pink and non-injected EARS: External ears normal OROPHARYNX:mucous membranes are moist  NECK: supple, no adenopathy, thyroid normal size, non-tender, without nodularity, no stridor, non-tender, trachea midline LYMPH:  no palpable lymphadenopathy, no hepatosplenomegaly BREAST:not examined LUNGS: clear to auscultation  HEART: regular rate & rhythm, no murmurs, no gallops, S1 normal and S2 normal ABDOMEN:abdomen soft, non-tender and normal bowel sounds BACK: Back symmetric, no curvature. EXTREMITIES:less then 2 second capillary refill, no joint deformities, effusion, or inflammation, no skin discoloration, no clubbing, no cyanosis, intermittent tenderness to palpation of the right lateral anterior aspect of the left. Intermittent active and passive dorsiflexion and plantar flexion pain. Inversion of ankle as tender as well. Eversion is not tender. No obvious palpable abnormalities. No erythema, heat, or edema appreciated. NEURO: alert & oriented x 3 with fluent speech, no focal motor/sensory deficits    LABORATORY DATA: CBC    Component Value Date/Time   WBC 9.0 05/27/2012 1016   RBC 4.35 05/27/2012 1016   HGB 14.1 05/27/2012 1016   HCT 40.1 05/27/2012 1016   PLT 261 05/27/2012 1016   MCV 92.2 05/27/2012 1016   MCH 32.4 05/27/2012 1016   MCHC 35.2 05/27/2012 1016   RDW 14.6 05/27/2012 1016   LYMPHSABS 1.7 05/27/2012 1016   MONOABS 0.8 05/27/2012 1016   EOSABS 0.1 05/27/2012 1016   BASOSABS 0.0 05/27/2012 1016      Chemistry      Component Value  Date/Time   NA 138 05/27/2012 1016   K 3.9 05/27/2012 1016   CL 101 05/27/2012 1016   CO2 26 05/27/2012 1016   BUN 16 05/27/2012 1016   CREATININE 0.73 05/27/2012 1016      Component Value Date/Time   CALCIUM 9.6 05/27/2012 1016   ALKPHOS 75 05/27/2012 1016   AST 50* 05/27/2012 1016   ALT 71* 05/27/2012 1016   BILITOT 0.4 05/27/2012 1016        ASSESSMENT:  1. Stage II (T2, N1, M0) infiltrating ductal carcinoma the right breast with a 3.2 cm primary and 3 of 12 positive lymph nodes all with extranodal extension and LV I. Cancer was ER +89%, PR +50%, HER-2/neu nonamplified. Ki-67 marker was high at 33% and received 4 cycles of Adriamycin and Cytoxan (08/09/2011- 10/16/2011) followed by 4 cycles of Taxotere q21 days (11/06/2011- 01/08/2012). Her Adriamycin and Cytoxan had to be reduced due to toxicity. She is now S/P radiation therapy finishing on 04/08/2012. Now on Anastrozole starting on 04/21/2012 and take this for at least 5 years. 2. COPD, still smoking cigarettes 3. Idiopathic urticaria intermittently times many years, unclear as to etiology. 4. Right foot pain 5.  URI with green discharge from nose.  Patient Active Problem List  Diagnosis  . Breast cancer  . Hypertension  . Gastric ulcer  . Pain in joint, shoulder region  . Muscle tightness  . Port catheter in place      PLAN:  1. I personally reviewed and went over laboratory results with the patient. 2. Continue Anastrozole daily 3. Discussed the risks, benefits, alternatives, and side effects of Anastrozole including hot flashes, arthralgias, and myalgias.  4. Lab work today: B12, Folate, TSH 5. B/L Foot xrays 6. If lab work and x-rays are negative, we'll refer the patient to podiatrist (Dr. Aldean Baker). Patient encouraged to take over-the-counter Aleve or Tylenol for discomfort. She may also use ice or heat for the discomfort as well. 7. I have escribed a Z-Pak for URI 8. Patient return to clinic in one weeks time for followup  in aforementioned tests. This can always be canceled and rescheduled depending on results from above.  All questions were answered. The patient knows to call the clinic with any problems, questions or concerns. We can certainly see the patient much sooner if necessary.  The patient and plan discussed with Glenford Peers, MD and he is in agreement with the aforementioned.  KEFALAS,THOMAS

## 2012-05-28 NOTE — Progress Notes (Signed)
Danielle [name] presented for [employer]. Labs per MD order drawn via Peripheral Line 23 gauge needle inserted in Left AC  Good blood return present. Procedure without incident.  Needle removed intact. Patient tolerated procedure well.

## 2012-05-29 LAB — VITAMIN B12: Vitamin B-12: 448 pg/mL (ref 211–911)

## 2012-06-04 ENCOUNTER — Encounter: Payer: Self-pay | Admitting: Orthopedic Surgery

## 2012-06-04 ENCOUNTER — Ambulatory Visit (INDEPENDENT_AMBULATORY_CARE_PROVIDER_SITE_OTHER): Payer: Medicaid Other | Admitting: Orthopedic Surgery

## 2012-06-04 DIAGNOSIS — M84376A Stress fracture, unspecified foot, initial encounter for fracture: Secondary | ICD-10-CM | POA: Insufficient documentation

## 2012-06-04 DIAGNOSIS — M8430XA Stress fracture, unspecified site, initial encounter for fracture: Secondary | ICD-10-CM

## 2012-06-04 DIAGNOSIS — M84374A Stress fracture, right foot, initial encounter for fracture: Secondary | ICD-10-CM

## 2012-06-04 DIAGNOSIS — M19079 Primary osteoarthritis, unspecified ankle and foot: Secondary | ICD-10-CM | POA: Insufficient documentation

## 2012-06-04 MED ORDER — NAPROXEN 500 MG PO TABS: 500.0000 mg | ORAL_TABLET | Freq: Two times a day (BID) | ORAL | Status: DC

## 2012-06-04 NOTE — Progress Notes (Signed)
Patient ID: Danielle [name], female   DOB: Feb 07, 1956, 57 y.o.   MRN: [id number] Chief Complaint  Patient presents with  . Foot Pain    Bilateral foot pain, mostly in the right    Patient history: 57 year old female previously and currently treated at the cancer Center who presents with right greater than left bilateral foot pain x1 month. Her symptoms came on gradually she is dorsal and dorsolateral foot pain on the right dorsal foot pain on the left. Sharp throbbing stabbing pain with the feeling that there is shifting going on in the right foot. Pain 9/10 constant worse with standing.  Employed as Psychologist, sport and exercise  History of weight gain cough joint pain dizziness seasonal allergies otherwise normal  Allergy to Keflex  History of cancer hypertension seasonal allergies status post partial mastectomy  Family physician Dr. Caralee Ates  [facility]  Family history heart disease arthritis cancer diabetes  Social history married  One pack per day smoker  2 glasses of wine per day  3-4 cups of coffee per day   BP 150/88  Ht 5' 4.5" (1.638 m)  Wt 182 lb (82.555 kg)  BMI 30.77 kg/m2 General appearance is normal, the patient is alert and oriented x3 with normal mood and affect.  The patient is ablating with a limp favoring the right lower Jimmy  Bilateral foot evaluation shows dorsal spurring at the talonavicular and tarsal metatarsal joints. Ankle range of motion is normal tenderness noted of the dorsum of the foot no instability noted motor exam normal with no atrophy skin intact pulses good normal sensation  X-rays show degenerative arthritis of the midfoot and talonavicular joint  OA (osteoarthritis) of foot, right - Plan: naproxen (NAPROSYN) 500 MG tablet   Recommend right Cam Walker just in case it's a stress fracture for 6 weeks  Foot orthotics Spenco Warm 'n Form started left foot transferred her right foot once 6 weeks are up  Return 6 weeks

## 2012-06-05 ENCOUNTER — Ambulatory Visit (HOSPITAL_COMMUNITY): Payer: Medicaid Other | Admitting: Oncology

## 2012-06-11 ENCOUNTER — Ambulatory Visit: Payer: Medicaid Other | Admitting: Orthopedic Surgery

## 2012-06-17 ENCOUNTER — Telehealth: Payer: Self-pay | Admitting: Orthopedic Surgery

## 2012-06-17 NOTE — Telephone Encounter (Signed)
Left message to call back  

## 2012-06-17 NOTE — Telephone Encounter (Signed)
Sleep in it

## 2012-06-17 NOTE — Telephone Encounter (Signed)
Danielle [name] wears the boot all the time except while sleeping.  When Danielle [name] takes it off her foot hurts very badly.  Danielle [name] is  asking if there is anything Danielle [name] can put on her foot to stailize it while sleeping.  Also , Danielle [name] cannot take the Naprosyn because it elevates her blood pressure. Please advise.  Her # [phone]

## 2012-06-18 NOTE — Telephone Encounter (Signed)
Advised of doctor's reply °

## 2012-06-23 ENCOUNTER — Other Ambulatory Visit (HOSPITAL_COMMUNITY): Payer: Medicaid Other

## 2012-06-24 ENCOUNTER — Encounter (HOSPITAL_COMMUNITY): Payer: Medicaid Other | Attending: Oncology

## 2012-06-24 DIAGNOSIS — C773 Secondary and unspecified malignant neoplasm of axilla and upper limb lymph nodes: Secondary | ICD-10-CM

## 2012-06-24 DIAGNOSIS — C50919 Malignant neoplasm of unspecified site of unspecified female breast: Secondary | ICD-10-CM | POA: Insufficient documentation

## 2012-06-24 DIAGNOSIS — Z452 Encounter for adjustment and management of vascular access device: Secondary | ICD-10-CM

## 2012-06-24 DIAGNOSIS — C50419 Malignant neoplasm of upper-outer quadrant of unspecified female breast: Secondary | ICD-10-CM

## 2012-06-24 MED ORDER — SODIUM CHLORIDE 0.9 % IJ SOLN
20.0000 mL | INTRAMUSCULAR | Status: DC | PRN
  Administered 2012-06-24: 20 mL via INTRAVENOUS
  Filled 2012-06-24: qty 20

## 2012-06-24 MED ORDER — HEPARIN SOD (PORK) LOCK FLUSH 100 UNIT/ML IV SOLN
INTRAVENOUS | Status: AC
  Filled 2012-06-24: qty 5

## 2012-06-24 MED ORDER — HEPARIN SOD (PORK) LOCK FLUSH 100 UNIT/ML IV SOLN
500.0000 [IU] | Freq: Once | INTRAVENOUS | Status: AC
  Administered 2012-06-24: 500 [IU] via INTRAVENOUS
  Filled 2012-06-24: qty 5

## 2012-06-24 NOTE — Progress Notes (Signed)
Danielle [name] presented for Portacath access and flush. Proper placement of portacath confirmed by CXR. Portacath located left chest wall accessed with  H 20 needle. No blood return and flushed well without any discomfort.   Portacath flushed with 20ml NS and 500U/8ml Heparin and needle removed intact. Procedure without incident. Patient tolerated procedure well.

## 2012-06-25 ENCOUNTER — Ambulatory Visit (HOSPITAL_COMMUNITY): Payer: Medicaid Other | Admitting: Oncology

## 2012-07-17 ENCOUNTER — Ambulatory Visit (INDEPENDENT_AMBULATORY_CARE_PROVIDER_SITE_OTHER): Payer: Medicaid Other | Admitting: Orthopedic Surgery

## 2012-07-17 ENCOUNTER — Encounter: Payer: Self-pay | Admitting: Orthopedic Surgery

## 2012-07-17 DIAGNOSIS — M19079 Primary osteoarthritis, unspecified ankle and foot: Secondary | ICD-10-CM

## 2012-07-17 NOTE — Patient Instructions (Addendum)
Recommend no weight bearing but if you can not do that then stay in boot  Continue naprosyn

## 2012-07-17 NOTE — Progress Notes (Signed)
Subjective:     Patient ID: Danielle [name], female   DOB: 1955/06/07, 57 y.o.   MRN: [id number] Chief Complaint  Patient presents with  . Follow-up    Bilateral foot pain    HPI bilateral foot arthritis. The patient is in a Cam Walker on the right taking Naprosyn. She reports improvement in pain symptoms when she's wearing her brace when she takes a Cam Walker off she has recurrent pain. She's not symptomatic over the left side. Most of her pain is dorsal over the tarsometatarsal joint with no numbness or tingling   Review of Systems Neurologic symptoms normal    Objective:   Physical Exam General appearance is normal, the patient is alert and oriented x3 with normal mood and affect. BP 142/92  Ht 5' 4.5" (1.638 m)  Wt 182 lb (82.555 kg)  BMI 30.77 kg/m2  Both feet are examined. There is a dorsal spur and the tarsometatarsal area. There is no instability of Lisfranc joint. Muscle tone is normal without atrophy skin is intact pulses are good color is normal    Assessment:     Bilateral foot arthritis, the patient cannot be nonweightbearing because she has to work which requires standing on her feet but she is asymptomatic in the [employer]    Plan:     Continue Naprosyn and Cam Walker followup visit will be scheduled.

## 2012-08-07 ENCOUNTER — Encounter (HOSPITAL_COMMUNITY): Payer: Medicaid Other | Attending: Oncology

## 2012-08-07 DIAGNOSIS — C50419 Malignant neoplasm of upper-outer quadrant of unspecified female breast: Secondary | ICD-10-CM

## 2012-08-07 DIAGNOSIS — Z452 Encounter for adjustment and management of vascular access device: Secondary | ICD-10-CM

## 2012-08-07 DIAGNOSIS — C50919 Malignant neoplasm of unspecified site of unspecified female breast: Secondary | ICD-10-CM | POA: Insufficient documentation

## 2012-08-07 MED ORDER — HEPARIN SOD (PORK) LOCK FLUSH 100 UNIT/ML IV SOLN
INTRAVENOUS | Status: AC
  Filled 2012-08-07: qty 5

## 2012-08-07 MED ORDER — HEPARIN SOD (PORK) LOCK FLUSH 100 UNIT/ML IV SOLN
500.0000 [IU] | Freq: Once | INTRAVENOUS | Status: AC
  Administered 2012-08-07: 500 [IU] via INTRAVENOUS
  Filled 2012-08-07: qty 5

## 2012-08-07 MED ORDER — SODIUM CHLORIDE 0.9 % IJ SOLN
10.0000 mL | INTRAMUSCULAR | Status: DC | PRN
  Administered 2012-08-07: 10 mL via INTRAVENOUS
  Filled 2012-08-07: qty 10

## 2012-08-07 NOTE — Progress Notes (Signed)
Danielle [name] presented for Portacath access and flush.  Proper placement of portacath confirmed by CXR.  Portacath located left chest wall accessed with  H 20 needle.  No blood return and Portacath flushed with 20ml NS and 500U/23ml Heparin and needle removed intact.  Procedure tolerated well and without incident.

## 2012-08-27 ENCOUNTER — Encounter (HOSPITAL_COMMUNITY): Payer: Medicaid Other | Attending: Oncology | Admitting: Oncology

## 2012-08-27 ENCOUNTER — Encounter (HOSPITAL_COMMUNITY): Payer: Self-pay | Admitting: Oncology

## 2012-08-27 VITALS — BP 104/63 | HR 76 | Temp 98.5°F | Resp 16 | Wt 175.7 lb

## 2012-08-27 DIAGNOSIS — C50919 Malignant neoplasm of unspecified site of unspecified female breast: Secondary | ICD-10-CM

## 2012-08-27 DIAGNOSIS — C50911 Malignant neoplasm of unspecified site of right female breast: Secondary | ICD-10-CM

## 2012-08-27 LAB — COMPREHENSIVE METABOLIC PANEL
ALT: 102 U/L — ABNORMAL HIGH (ref 0–35)
BUN: 18 mg/dL (ref 6–23)
CO2: 30 mEq/L (ref 19–32)
Calcium: 9.6 mg/dL (ref 8.4–10.5)
Creatinine, Ser: 0.75 mg/dL (ref 0.50–1.10)
GFR calc Af Amer: >90 mL/min (ref >90)
GFR calc non Af Amer: >90 mL/min (ref >90)
Glucose, Bld: 86 mg/dL (ref 70–99)
Sodium: 140 mEq/L (ref 135–145)
Total Protein: 7.6 g/dL (ref 6.0–8.3)

## 2012-08-27 LAB — CBC WITH DIFFERENTIAL/PLATELET
Eosinophils Absolute: 0.1 10*3/uL (ref 0.0–0.7)
Eosinophils Relative: 1 % (ref 0–5)
HCT: 41.1 % (ref 36.0–46.0)
Lymphs Abs: 1.5 10*3/uL (ref 0.7–4.0)
MCH: 34.9 pg — ABNORMAL HIGH (ref 26.0–34.0)
MCV: 98.8 fL (ref 78.0–100.0)
Monocytes Absolute: 0.5 10*3/uL (ref 0.1–1.0)
Platelets: 209 10*3/uL (ref 150–400)
RBC: 4.16 MIL/uL (ref 3.87–5.11)

## 2012-08-27 NOTE — Progress Notes (Signed)
Danielle [name] presented for [employer]. Labs per MD order drawn via Peripheral Line 23 gauge needle inserted in left antecubital.  Good blood return present. Procedure without incident.  Needle removed intact. Patient tolerated procedure well.

## 2012-08-27 NOTE — Patient Instructions (Addendum)
[facility] Discharge Instructions  RECOMMENDATIONS MADE BY THE CONSULTANT AND ANY TEST RESULTS WILL BE SENT TO YOUR REFERRING PHYSICIAN.  Lab work today. Continue port flush every 6 weeks. We will get you scheduled for bilateral mammogram. We can refer you to the lymphedema specialist in our physical therapy department if you choose to do so. MD appointment in 4 months. Report any issues/concerns to clinic as needed prior to appointments.  Thank you for choosing [facility] to provide your oncology and hematology care.  To afford each patient quality time with our providers, please arrive at least 15 minutes before your scheduled appointment time.  With your help, our goal is to use those 15 minutes to complete the necessary work-up to ensure our physicians have the information they need to help with your evaluation and healthcare recommendations.    Effective January 1st, 2014, we ask that you re-schedule your appointment with our physicians should you arrive 10 or more minutes late for your appointment.  We strive to give you quality time with our providers, and arriving late affects you and other patients whose appointments are after yours.    Again, thank you for choosing [facility].  Our hope is that these requests will decrease the amount of time that you wait before being seen by our physicians.       _____________________________________________________________  Should you have questions after your visit to [facility], please contact our office at [phone] between the hours of 8:30 a.m. and 5:00 p.m.  Voicemails left after 4:30 p.m. will not be returned until the following business day.  For prescription refill requests, have your pharmacy contact our office with your prescription refill request.

## 2012-08-27 NOTE — Progress Notes (Signed)
Lenise Herald, PA-C [address] [place] [address]  Breast cancer, right  CURRENT THERAPY: Anastrozole daily beginning on 04/21/2012 and she will take that for at least 5 years.    INTERVAL HISTORY: Danielle [name] 57 y.o. 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 Cancer was ER +89%, PR +50%, HER-2/neu nonamplified. Ki-67 marker was high at 33% and received 4 cycles of Adriamycin and Cytoxan (08/09/2011- 10/16/2011) followed by 4 cycles of Taxotere q21 days (11/06/2011- 01/08/2012). Her Adriamycin and Cytoxan had to be reduced due to toxicity. She is now S/P radiation therapy finishing on 04/08/2012. Now on Anastrozole starting on 04/21/2012 and take this for at least 5 years.   The patient is seeing Dr. Romeo Apple with regards to her foot issues.  She reports that her right breast is tender at times and also causes her intermittent discomfort. This is likely secondary to her surgery plus or minus radiation therapy. Is possibly neuropathic pain in postoperative changes.  She otherwise denies any complaints and oncologic review of systems is negative.  She questions how she'll know if her cancer comes back I provided her information regarding this. The key for her is regular screening breast examinations with screening mammograms yearly.  I personally reviewed and went over laboratory results with the patient.  Her lab work in April 2014 is unimpressive.  I personally reviewed and went over radiographic studies with the patient.  I reviewed her bilateral foot imaging. She is overdue for her mammogram and we'll set that up in the near future.  She is educated regarding the importance of yearly screening mammograms.  She is decreasing her cigarette smoking. She's had a half a pack per day and she is transitioning  more to the electronic cigarettes. I provided her smoking cessation education today.  Past Medical History  Diagnosis Date  . Hypertension   . Cancer   . Borderline hyperlipidemia     managed with diet and exercise  . Breast cancer 08/17/2011  . Gastric ulcer 09/17/2011  . Port catheter in place 05/13/2012    has Breast cancer; Hypertension; Gastric ulcer; Pain in joint, shoulder region; Muscle tightness; Port catheter in place; Stress fracture of foot; and OA (osteoarthritis) of foot on her problem list.     is allergic to bactrim and cephalexin.  Ms. Gerstel had no medications administered during this visit.  Past Surgical History  Procedure Laterality Date  . Tubal ligation  1980  . Mastectomy, partial  06/04/2011    Procedure: MASTECTOMY PARTIAL;  Surgeon: Fabio Bering, MD;  Location: [facility];  Service: General;  Laterality: Right;  . Axillary lymph node dissection  06/04/2011    Procedure: AXILLARY LYMPH NODE DISSECTION;  Surgeon: Fabio Bering, MD;  Location: [facility];  Service: General;  Laterality: Right;  . Portacath placement  07/23/2011    Procedure: INSERTION PORT-A-CATH;  Surgeon: Fabio Bering, MD;  Location: [facility];  Service: General;  Laterality: Left;  Left Subclavian  . Skin biopsy  12/19/2011    Procedure: BIOPSY SKIN;  Surgeon: Fabio Bering, MD;  Location: [facility];  Service: General;  Laterality: Right;  In Minor Room    Denies any headaches, dizziness, double vision, fevers, chills, night sweats, nausea, vomiting, diarrhea, constipation, chest pain, heart palpitations,  shortness of breath, blood in stool, black tarry stool, urinary pain, urinary burning, urinary frequency, hematuria.   PHYSICAL EXAMINATION  ECOG PERFORMANCE STATUS: 0 - Asymptomatic  Filed Vitals:   08/27/12 1100  BP: 104/63  Pulse: 76  Temp: 98.5 F (36.9 C)  Resp: 16    GENERAL:alert, no distress, well nourished, well developed, comfortable, cooperative and smiling SKIN: skin color,  texture, turgor are normal, no rashes or significant lesions HEAD: Normocephalic, No masses, lesions, tenderness or abnormalities EYES: normal, PERRLA, EOMI, Conjunctiva are pink and non-injected EARS: External ears normal OROPHARYNX:mucous membranes are moist  NECK: supple, no adenopathy, thyroid normal size, non-tender, without nodularity, no stridor, non-tender, trachea midline LYMPH:  no palpable lymphadenopathy, no hepatosplenomegaly BREAST:left breast normal without mass, skin or nipple changes or axillary nodes, right breast is erythematous with peau d' oranges changes but these were negative on past biopsy.  This is stable.  Also right breast tissue thickening. SECONDARY TO LYMPHEDEMA LUNGS: clear to auscultation and percussion, decreased breath sounds HEART: regular rate & rhythm, no murmurs, no gallops, S1 normal and S2 normal ABDOMEN:abdomen soft, non-tender, normal bowel sounds, no masses or organomegaly and no hepatosplenomegaly BACK: No CVA tenderness EXTREMITIES:less then 2 second capillary refill, no joint deformities, effusion, or inflammation, no edema, no skin discoloration, no clubbing, no cyanosis  NEURO: alert & oriented x 3 with fluent speech, no focal motor/sensory deficits, gait normal   LABORATORY DATA: CBC    Component Value Date/Time   WBC 9.0 05/27/2012 1016   RBC 4.35 05/27/2012 1016   HGB 14.1 05/27/2012 1016   HCT 40.1 05/27/2012 1016   PLT 261 05/27/2012 1016   MCV 92.2 05/27/2012 1016   MCH 32.4 05/27/2012 1016   MCHC 35.2 05/27/2012 1016   RDW 14.6 05/27/2012 1016   LYMPHSABS 1.7 05/27/2012 1016   MONOABS 0.8 05/27/2012 1016   EOSABS 0.1 05/27/2012 1016   BASOSABS 0.0 05/27/2012 1016      Chemistry      Component Value Date/Time   NA 138 05/27/2012 1016   K 3.9 05/27/2012 1016   CL 101 05/27/2012 1016   CO2 26 05/27/2012 1016   BUN 16 05/27/2012 1016   CREATININE 0.73 05/27/2012 1016      Component Value Date/Time   CALCIUM 9.6 05/27/2012 1016   ALKPHOS  75 05/27/2012 1016   AST 50* 05/27/2012 1016   ALT 71* 05/27/2012 1016   BILITOT 0.4 05/27/2012 1016        RADIOGRAPHIC STUDIES:  05/28/2012  *RADIOLOGY REPORT*  Clinical Data: Pain at dorsum of both feet for 2 months, no known  injury  LEFT FOOT - 2 VIEW  Comparison: None  Findings:  Osseous demineralization.  Joint spaces preserved.  Minimal degenerative changes at dorsal margin of talonavicular  joint with overlying soft tissue swelling.  No acute fracture, dislocation or bone destruction.  IMPRESSION:  Minimal talonavicular degenerative changes with overlying soft  tissue swelling which could be related to arthropathy or synovitis.  No acute osseous findings.  Original Report Authenticated By: Ulyses Southward, M.D.  *RADIOLOGY REPORT*  Clinical Data: Pain at dorsum of both feet for 2 months  RIGHT FOOT - 2 VIEW  Comparison: None  Findings:  Bones appear demineralized.  Mild bony overgrowth at medial margin of first metatarsal head.  Mild degenerative changes at tarsal navicular joint with spur  formation.  Overlying dorsal soft tissue swelling.  Moderate sized plantar calcaneal spur.  Remaining joint spaces preserved.  No acute fracture  or dislocation.  IMPRESSION:  Mild degenerative changes at talonavicular joint with overlying  soft tissue swelling, could be related to arthropathy or synovitis.  Moderate sized plantar calcaneal spur.  Osseous demineralization with bunion deformity.  Original Report Authenticated By: Ulyses Southward, M.D.     ASSESSMENT:  1. Stage II (T2, N1, M0) infiltrating ductal carcinoma the right breast with a 3.2 cm primary and 3 of 12 positive lymph nodes all with extranodal extension and LV I. Cancer was ER +89%, PR +50%, HER-2/neu nonamplified. Ki-67 marker was high at 33% and received 4 cycles of Adriamycin and Cytoxan (08/09/2011- 10/16/2011) followed by 4 cycles of Taxotere q21 days (11/06/2011- 01/08/2012). Her Adriamycin and Cytoxan had to be  reduced due to toxicity. She is now S/P radiation therapy finishing on 04/08/2012. Now on Anastrozole starting on 04/21/2012 and take this for at least 5 years.  2. COPD, still smoking cigarettes  3. Idiopathic urticaria intermittently times many years, unclear as to etiology.  4. Right breast lymphedema contributing to erythema and skin changes.    Patient Active Problem List   Diagnosis Date Noted  . Stress fracture of foot 06/04/2012  . 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 Port catheter in place 05/13/2012  . Pain in joint, shoulder region 04/15/2012  . Muscle tightness 04/15/2012  . Gastric ulcer 09/17/2011  . Breast cancer 08/17/2011  . Hypertension 08/17/2011     PLAN:  1. I personally reviewed and went over laboratory results with the patient. 2. I personally reviewed and went over radiographic studies with the patient. 3. Screening mammogram with diagnostic mammogram of the right breast 4. Labs today: CBC diff, CMET 5. Smoking cessation education provided 6. Continue Anastrozole daily 7. Return in 4 months for follow-up   THERAPY PLAN:  She'll continue with anastrozole daily as she is tolerating this well. We'll get her set up for screening mammogram  diagnostic mammogram on the right. We'll see her back in 4 months for followup.  All questions were answered. The patient knows to call the clinic with any problems, questions or concerns. We can certainly see the patient much sooner if necessary.  Patient and plan discussed with Dr. Erline Hau and he is in agreement with the aforementioned.  Patient seen and examined by Dr. Sharia Reeve as well.   Raydin Bielinski

## 2012-09-02 ENCOUNTER — Ambulatory Visit: Payer: Medicaid Other | Admitting: Orthopedic Surgery

## 2012-09-03 ENCOUNTER — Ambulatory Visit (HOSPITAL_COMMUNITY)
Admission: RE | Admit: 2012-09-03 | Discharge: 2012-09-03 | Disposition: A | Discharge status: Home / Self Care | Payer: Medicaid Other | Source: Ambulatory Visit | Attending: Oncology | Admitting: Oncology

## 2012-09-03 DIAGNOSIS — Z853 Personal history of malignant neoplasm of breast: Secondary | ICD-10-CM | POA: Insufficient documentation

## 2012-09-03 DIAGNOSIS — C50911 Malignant neoplasm of unspecified site of right female breast: Secondary | ICD-10-CM

## 2012-09-18 ENCOUNTER — Encounter (HOSPITAL_COMMUNITY): Payer: Medicaid Other | Attending: Oncology

## 2012-09-18 DIAGNOSIS — C50419 Malignant neoplasm of upper-outer quadrant of unspecified female breast: Secondary | ICD-10-CM

## 2012-09-18 DIAGNOSIS — Z452 Encounter for adjustment and management of vascular access device: Secondary | ICD-10-CM

## 2012-09-18 DIAGNOSIS — C50919 Malignant neoplasm of unspecified site of unspecified female breast: Secondary | ICD-10-CM

## 2012-09-18 MED ORDER — SODIUM CHLORIDE 0.9 % IJ SOLN
10.0000 mL | INTRAMUSCULAR | Status: DC | PRN
  Administered 2012-09-18: 10 mL via INTRAVENOUS
  Filled 2012-09-18: qty 10

## 2012-09-18 MED ORDER — HEPARIN SOD (PORK) LOCK FLUSH 100 UNIT/ML IV SOLN
500.0000 [IU] | Freq: Once | INTRAVENOUS | Status: AC
  Administered 2012-09-18: 500 [IU] via INTRAVENOUS
  Filled 2012-09-18: qty 5

## 2012-09-18 MED ORDER — HEPARIN SOD (PORK) LOCK FLUSH 100 UNIT/ML IV SOLN
INTRAVENOUS | Status: AC
  Filled 2012-09-18: qty 5

## 2012-09-18 NOTE — Progress Notes (Signed)
Danielle [name] presented for Portacath access and flush. Proper placement of portacath confirmed by CXR. Portacath located left chest wall accessed with  H 20 needle. No blood return and patient reports this is usual. Portacath flushed with 20ml NS and 500U/72ml Heparin and needle removed intact. Procedure without incident. Patient tolerated procedure well.

## 2012-10-30 ENCOUNTER — Encounter (HOSPITAL_COMMUNITY): Payer: Medicaid Other | Attending: Oncology

## 2012-10-30 DIAGNOSIS — Z452 Encounter for adjustment and management of vascular access device: Secondary | ICD-10-CM

## 2012-10-30 DIAGNOSIS — C50419 Malignant neoplasm of upper-outer quadrant of unspecified female breast: Secondary | ICD-10-CM

## 2012-10-30 DIAGNOSIS — C50919 Malignant neoplasm of unspecified site of unspecified female breast: Secondary | ICD-10-CM | POA: Insufficient documentation

## 2012-10-30 DIAGNOSIS — C773 Secondary and unspecified malignant neoplasm of axilla and upper limb lymph nodes: Secondary | ICD-10-CM

## 2012-10-30 MED ORDER — HEPARIN SOD (PORK) LOCK FLUSH 100 UNIT/ML IV SOLN
INTRAVENOUS | Status: AC
  Filled 2012-10-30: qty 5

## 2012-10-30 MED ORDER — HEPARIN SOD (PORK) LOCK FLUSH 100 UNIT/ML IV SOLN
500.0000 [IU] | Freq: Once | INTRAVENOUS | Status: AC
  Administered 2012-10-30: 500 [IU] via INTRAVENOUS

## 2012-10-30 MED ORDER — SODIUM CHLORIDE 0.9 % IJ SOLN
10.0000 mL | INTRAMUSCULAR | Status: DC | PRN
  Administered 2012-10-30: 10 mL via INTRAVENOUS

## 2012-10-30 NOTE — Progress Notes (Signed)
Danielle [name] presented for Portacath access and flush.  Proper placement of portacath confirmed by CXR.  Portacath located left chest wall accessed with  H 20 needle.  Good blood return present. Portacath flushed with 20ml NS and 500U/30ml Heparin and needle removed intact.  Procedure tolerated well and without incident.

## 2012-11-13 ENCOUNTER — Other Ambulatory Visit (HOSPITAL_COMMUNITY): Payer: Self-pay | Admitting: Oncology

## 2012-11-13 DIAGNOSIS — K259 Gastric ulcer, unspecified as acute or chronic, without hemorrhage or perforation: Secondary | ICD-10-CM

## 2012-11-13 MED ORDER — OMEPRAZOLE 20 MG PO CPDR: 20.0000 mg | DELAYED_RELEASE_CAPSULE | Freq: Two times a day (BID) | ORAL | Status: DC

## 2012-11-27 ENCOUNTER — Other Ambulatory Visit (HOSPITAL_COMMUNITY): Payer: Self-pay | Admitting: Oncology

## 2012-12-11 ENCOUNTER — Encounter (HOSPITAL_COMMUNITY): Payer: Medicaid Other | Attending: Oncology

## 2012-12-11 DIAGNOSIS — C50919 Malignant neoplasm of unspecified site of unspecified female breast: Secondary | ICD-10-CM | POA: Insufficient documentation

## 2012-12-11 DIAGNOSIS — C50419 Malignant neoplasm of upper-outer quadrant of unspecified female breast: Secondary | ICD-10-CM

## 2012-12-11 DIAGNOSIS — Z452 Encounter for adjustment and management of vascular access device: Secondary | ICD-10-CM

## 2012-12-11 MED ORDER — SODIUM CHLORIDE 0.9 % IJ SOLN
10.0000 mL | INTRAMUSCULAR | Status: DC | PRN
  Administered 2012-12-11: 10 mL via INTRAVENOUS

## 2012-12-11 MED ORDER — HEPARIN SOD (PORK) LOCK FLUSH 100 UNIT/ML IV SOLN
INTRAVENOUS | Status: AC
  Filled 2012-12-11: qty 5

## 2012-12-11 MED ORDER — HEPARIN SOD (PORK) LOCK FLUSH 100 UNIT/ML IV SOLN
500.0000 [IU] | Freq: Once | INTRAVENOUS | Status: AC
  Administered 2012-12-11: 500 [IU] via INTRAVENOUS

## 2012-12-11 NOTE — Progress Notes (Signed)
Danielle [name] presented for Portacath access and flush.  Proper placement of portacath confirmed by CXR.  Portacath located left chest wall accessed with  H 20 needle.  Good blood return present. Portacath flushed with 20ml NS and 500U/1ml Heparin and needle removed intact.  Procedure tolerated well and without incident.

## 2012-12-29 ENCOUNTER — Other Ambulatory Visit (HOSPITAL_COMMUNITY): Payer: Self-pay | Admitting: Oncology

## 2012-12-29 ENCOUNTER — Encounter (HOSPITAL_COMMUNITY): Payer: Self-pay | Admitting: Oncology

## 2012-12-29 NOTE — Progress Notes (Signed)
-  No Show, letter sent- 

## 2012-12-30 ENCOUNTER — Ambulatory Visit (HOSPITAL_COMMUNITY): Payer: Medicaid Other | Admitting: Oncology

## 2013-01-12 NOTE — Progress Notes (Signed)
Danielle [name], Danielle Sun, PA-C [name] 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    INTERVAL HISTORY: Danielle [name] 57 y.o. 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 Cancer was ER +89%, PR +50%, HER-2/neu nonamplified. Ki-67 marker was high at 33% and received 4 cycles of Adriamycin and Cytoxan (08/09/2011- 10/16/2011) followed by 4 cycles of Taxotere q21 days (11/06/2011- 01/08/2012). Her Adriamycin and Cytoxan had to be reduced due to toxicity. She is now S/P radiation therapy finishing on 04/08/2012. Now on Anastrozole starting on 04/21/2012 and she will take this for at least 5 years.     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  Angiolymphatic invasion identified, 3/12 positive lymph nodes, clear margins, ER+ 89%, PR+ 50%, Her2-, Ki-67 33%.  T2N1a   08/09/2011 - 10/16/2011 Chemotherapy Adriamycin/Cytoxan x 4 cycles   11/06/2011 - 01/08/2012 Chemotherapy Docetaxel x 4 (dose dense)    - 04/08/2012 Radiation Therapy    04/21/2012 -  Chemotherapy Anastrozole which she will take for 5 years (March 2019)    I personally reviewed and went over laboratory results with the patient.  Labs are from July 2014 and therefore out of date, but at that time, labs were unremarkable.   I personally reviewed and went over radiographic studies with the patient.  Mammogram on 09/04/2012  was BIRADS 2 and she will be due for another mammogram in July 2015.   NCCN guidelines recommends the following surveillance for invasive breast cancer:  A. History and Physical exam every 4-6 months for 5 years and then every 12 months.  B. Mammography every 12 months  C. Women on Tamoxifen: annual gynecologic assessment every 12 months if uterus is present.  D. Women on aromatase inhibitor or who experience ovarian failure secondary to treatment should have monitoring of bone health with a bone mineral density determination at baseline and periodically thereafter.  E. Assess and encourage adherence to adjuvant endocrine therapy.  F. Evidence suggests that active lifestyle and achieving and maintaining an ideal body weight (20-25 BMI) may lead to optimal breast cancer outcomes.  She is tolerating Anastrozole well without any major side effects.  She complains of right breast lymphedema, which she declined lymphedema clinic referral in the past.  She now wishes to pursue this opportunity. Therefore, I will refer her to the lymphedema clinic.   She reports that she is down to 5 cigarettes per day and is using e-cigarette as well.  I congratulated her on this and encouraged her to continue to decrease her tobacco needs with the goal of quitting altogether.    Oncologically, she otherwise denies any complaints and ROS questioning is negative.   Past Medical History  Diagnosis Date  . Hypertension   . Cancer   . Borderline hyperlipidemia     managed with diet and exercise  . Breast cancer 08/17/2011  . Gastric ulcer 09/17/2011  . Port  catheter in place 05/13/2012  . 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     is allergic to bactrim and cephalexin.  Ms. Danielle [name] does not currently have medications on file.  Past Surgical History  Procedure Laterality Date  . Tubal ligation  1980  . Mastectomy, partial  06/04/2011    Procedure: MASTECTOMY PARTIAL;  Surgeon: Fabio Bering, MD;  Location: [facility];  Service: General;  Laterality: Right;  . Axillary lymph node dissection  06/04/2011    Procedure: AXILLARY LYMPH NODE DISSECTION;  Surgeon: Fabio Bering, MD;  Location: [facility];  Service: General;  Laterality: Right;  . Portacath placement  07/23/2011    Procedure: INSERTION PORT-A-CATH;  Surgeon: Fabio Bering, MD;  Location: [facility];  Service: General;  Laterality: Left;  Left Subclavian  . Skin biopsy  12/19/2011    Procedure: BIOPSY SKIN;  Surgeon: Fabio Bering, MD;  Location: [facility];  Service: General;  Laterality: Right;  In Minor Room    Denies any headaches, dizziness, double vision, fevers, chills, night sweats, nausea, vomiting, diarrhea, constipation, chest pain, heart palpitations, shortness of breath, blood in stool, black tarry stool, urinary pain, urinary burning, urinary frequency, hematuria.   PHYSICAL EXAMINATION  ECOG PERFORMANCE STATUS: 0 - Asymptomatic  Filed Vitals:   01/13/13 1000  BP: 164/95  Pulse: 63  Temp: 98.3 F (36.8 C)  Resp: 18    GENERAL:alert, no distress, well nourished, well developed, comfortable, cooperative and smiling, thickened facial skin from smoking. SKIN: skin color, texture, turgor are normal, no rashes or significant lesions HEAD: Normocephalic, No masses, lesions, tenderness or abnormalities EYES: normal, PERRLA, EOMI, Conjunctiva are pink and non-injected EARS: External ears normal OROPHARYNX:mucous membranes are moist  NECK: supple, no adenopathy, thyroid normal size, non-tender, without nodularity, no stridor, non-tender, trachea midline LYMPH:  no palpable lymphadenopathy, no  hepatosplenomegaly BREAST:left breast normal without mass, skin or nipple changes or axillary nodes, right breast is erythematous with peau d' oranges changes but these were negative on past biopsy. This is stable. Also right breast tissue thickening. SECONDARY TO LYMPHEDEMA with areolar edema  LUNGS: clear to auscultation and percussion HEART: regular rate & rhythm, no murmurs, no gallops, S1 normal and S2 normal ABDOMEN:abdomen soft, non-tender, obese, normal bowel sounds, no masses or organomegaly and no hepatosplenomegaly BACK: Back symmetric, no curvature., No CVA tenderness EXTREMITIES:less then 2 second capillary refill, no joint deformities, effusion, or inflammation, no edema, no skin discoloration, no clubbing, no cyanosis, mild right upper extremity edema proximally.   NEURO: alert & oriented x 3 with fluent speech, no focal motor/sensory deficits, gait normal    LABORATORY DATA: CBC    Component Value Date/Time   WBC 6.9 08/27/2012 1213   RBC 4.16 08/27/2012 1213   HGB 14.5 08/27/2012 1213   HCT 41.1 08/27/2012 1213   PLT 209 08/27/2012 1213   MCV 98.8 08/27/2012 1213   MCH 34.9* 08/27/2012 1213   MCHC 35.3 08/27/2012 1213   RDW 12.6 08/27/2012 1213   LYMPHSABS 1.5 08/27/2012 1213   MONOABS 0.5 08/27/2012 1213   EOSABS 0.1 08/27/2012 1213   BASOSABS 0.0 08/27/2012 1213      Chemistry  Component Value Date/Time   NA 140 08/27/2012 1213   K 3.6 08/27/2012 1213   CL 102 08/27/2012 1213   CO2 30 08/27/2012 1213   BUN 18 08/27/2012 1213   CREATININE 0.75 08/27/2012 1213      Component Value Date/Time   CALCIUM 9.6 08/27/2012 1213   ALKPHOS 71 08/27/2012 1213   AST 76* 08/27/2012 1213   ALT 102* 08/27/2012 1213   BILITOT 0.3 08/27/2012 1213        RADIOGRAPHIC STUDIES:  09/04/2012  *RADIOLOGY REPORT*  Clinical Data: History of right breast cancer status post  lumpectomy 2013  DIGITAL DIAGNOSTIC BILATERAL MAMMOGRAM WITH CAD  Comparison: With priors  Findings:  ACR Breast  Density Category b: There are scattered areas of  fibroglandular density.  Lumpectomy and radiation therapy changes are seen in the right  breast. There is no suspicious mass or malignant-type  microcalcifications in either breast.  Mammographic images were processed with CAD.  IMPRESSION:  No evidence of malignancy in either breast.  RECOMMENDATION:  Diagnostic mammogram in 1 year is recommended.  I have discussed the findings and recommendations with the patient.  Results were also provided in writing at the conclusion of the  visit. If applicable, a reminder letter will be sent to the  patient regarding her next appointment.  BI-RADS CATEGORY 2: Benign finding(s).  Original Report Authenticated By: Baird Lyons, M.D.     ASSESSMENT:  1. Stage II (T2, N1, M0) infiltrating ductal carcinoma the right breast with a 3.2 cm primary and 3 of 12 positive lymph nodes all with extranodal extension and LV I. Cancer was ER +89%, PR +50%, HER-2/neu nonamplified. Ki-67 marker was high at 33% and received 4 cycles of Adriamycin and Cytoxan (08/09/2011- 10/16/2011) followed by 4 cycles of Taxotere q21 days (11/06/2011- 01/08/2012). Her Adriamycin and Cytoxan had to be reduced due to toxicity. She is now S/P radiation therapy finishing on 04/08/2012. Now on Anastrozole starting on 04/21/2012 and she will take this for at least 5 years. 2. COPD, still smoking cigarettes (down to 5 cigarettes daily) 3. Idiopathic urticaria intermittently times many years, unclear as to etiology.  4. Right breast lymphedema contributing to erythema and skin changes.  5. Right upper extremity edema proximally from lymphedema.  Patient Active Problem List   Diagnosis Date Noted  . Stress fracture of foot 06/04/2012  . 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 Port catheter in place 05/13/2012  . Pain in joint, shoulder region 04/15/2012  . Muscle tightness 04/15/2012  . Gastric ulcer 09/17/2011  . Infiltrating ductal  carcinoma of right female breast 08/17/2011  . Hypertension 08/17/2011      PLAN:  1. I personally reviewed and went over laboratory results with the patient. 2. I personally reviewed and went over radiographic studies with the patient. 3. Next screening mammogram is due in July 2015. 4. Labs today: CBC diff, CMET 5. Smoking cessation education provided.  6. Continue Anastrozole daily.  7. Review of NCCN guidelines for surveillance.  8. Referral to lymphedema clinic 9. Port removal by Dr. Leticia Penna at the patient's convenience.  She is 1 year out from systemic chemotherapy. 10. Return in 4 months for follow-up.  If all is well on next visit, will see her back every 6 months.   THERAPY PLAN:  We will continue to follow NCCN guidelines pertaining to surveillance.  NCCN guidelines recommends the following surveillance for invasive breast cancer:  A. History and Physical exam every 4-6 months for 5 years  and then every 12 months.  B. Mammography every 12 months  C. Women on Tamoxifen: annual gynecologic assessment every 12 months if uterus is present.  D. Women on aromatase inhibitor or who experience ovarian failure secondary to treatment should have monitoring of bone health with a bone mineral density determination at baseline and periodically thereafter.  E. Assess and encourage adherence to adjuvant endocrine therapy.  F. Evidence suggests that active lifestyle and achieving and maintaining an ideal body weight (20-25 BMI) may lead to optimal breast cancer outcomes.   All questions were answered. The patient knows to call the clinic with any problems, questions or concerns. We can certainly see the patient much sooner if necessary.  Patient and plan discussed with Dr. Alla German and he is in agreement with the aforementioned.   Neva Ramaswamy

## 2013-01-13 ENCOUNTER — Encounter (HOSPITAL_COMMUNITY): Payer: Self-pay | Admitting: Oncology

## 2013-01-13 ENCOUNTER — Other Ambulatory Visit (HOSPITAL_COMMUNITY): Payer: Self-pay | Admitting: Oncology

## 2013-01-13 ENCOUNTER — Encounter (HOSPITAL_COMMUNITY): Payer: Medicaid Other | Attending: Oncology | Admitting: Oncology

## 2013-01-13 VITALS — BP 164/95 | HR 63 | Temp 98.3°F | Resp 18 | Wt 177.8 lb

## 2013-01-13 DIAGNOSIS — R7401 Elevation of levels of liver transaminase levels: Secondary | ICD-10-CM

## 2013-01-13 DIAGNOSIS — I890 Lymphedema, not elsewhere classified: Secondary | ICD-10-CM

## 2013-01-13 DIAGNOSIS — F17200 Nicotine dependence, unspecified, uncomplicated: Secondary | ICD-10-CM

## 2013-01-13 DIAGNOSIS — C50919 Malignant neoplasm of unspecified site of unspecified female breast: Secondary | ICD-10-CM | POA: Insufficient documentation

## 2013-01-13 DIAGNOSIS — Z9889 Other specified postprocedural states: Secondary | ICD-10-CM | POA: Insufficient documentation

## 2013-01-13 DIAGNOSIS — C773 Secondary and unspecified malignant neoplasm of axilla and upper limb lymph nodes: Secondary | ICD-10-CM

## 2013-01-13 DIAGNOSIS — R7402 Elevation of levels of lactic acid dehydrogenase (LDH): Secondary | ICD-10-CM | POA: Insufficient documentation

## 2013-01-13 DIAGNOSIS — C50419 Malignant neoplasm of upper-outer quadrant of unspecified female breast: Secondary | ICD-10-CM

## 2013-01-13 DIAGNOSIS — C50911 Malignant neoplasm of unspecified site of right female breast: Secondary | ICD-10-CM

## 2013-01-13 LAB — COMPREHENSIVE METABOLIC PANEL
AST: 85 U/L — ABNORMAL HIGH (ref 0–37)
Albumin: 4.2 g/dL (ref 3.5–5.2)
Alkaline Phosphatase: 63 U/L (ref 39–117)
BUN: 13 mg/dL (ref 6–23)
CO2: 28 mEq/L (ref 19–32)
Chloride: 99 mEq/L (ref 96–112)
Creatinine, Ser: 0.84 mg/dL (ref 0.50–1.10)
GFR calc non Af Amer: 75 mL/min — ABNORMAL LOW (ref >90)
Potassium: 3.7 mEq/L (ref 3.5–5.1)
Total Bilirubin: 0.5 mg/dL (ref 0.3–1.2)

## 2013-01-13 LAB — CBC WITH DIFFERENTIAL/PLATELET
Basophils Relative: 1 % (ref 0–1)
HCT: 42.6 % (ref 36.0–46.0)
Hemoglobin: 14.6 g/dL (ref 12.0–15.0)
Lymphocytes Relative: 19 % (ref 12–46)
Monocytes Absolute: 0.7 10*3/uL (ref 0.1–1.0)
Monocytes Relative: 10 % (ref 3–12)
Neutro Abs: 4.8 10*3/uL (ref 1.7–7.7)
Neutrophils Relative %: 69 % (ref 43–77)
RBC: 4.21 MIL/uL (ref 3.87–5.11)
WBC: 7 10*3/uL (ref 4.0–10.5)

## 2013-01-13 NOTE — Patient Instructions (Signed)
[facility] Discharge Instructions  RECOMMENDATIONS MADE BY THE CONSULTANT AND ANY TEST RESULTS WILL BE SENT TO YOUR REFERRING PHYSICIAN.  EXAM FINDINGS BY THE PHYSICIAN TODAY AND SIGNS OR SYMPTOMS TO REPORT TO CLINIC OR PRIMARY PHYSICIAN:   Labs today  You may have your port-a-cath out  Continue to stop smoking  We are referring you to the Physical Therapy department here @ [facility] for lymphedema treatment.   Return to [place] in 4 months May 14, 2013 @ 11:30    Thank you for choosing [facility] to provide your oncology and hematology care.  To afford each patient quality time with our providers, please arrive at least 15 minutes before your scheduled appointment time.  With your help, our goal is to use those 15 minutes to complete the necessary work-up to ensure our physicians have the information they need to help with your evaluation and healthcare recommendations.    Effective January 1st, 2014, we ask that you re-schedule your appointment with our physicians should you arrive 10 or more minutes late for your appointment.  We strive to give you quality time with our providers, and arriving late affects you and other patients whose appointments are after yours.    Again, thank you for choosing [facility].  Our hope is that these requests will decrease the amount of time that you wait before being seen by our physicians.       _____________________________________________________________  Should you have questions after your visit to [facility], please contact our office at [phone] between the hours of 8:30 a.m. and 5:00 p.m.  Voicemails left after 4:30 p.m. will not be returned until the following business day.  For prescription refill requests, have your pharmacy contact our office with your prescription refill request.    Lymphedema Lymphedema is a swelling caused by the abnormal collection of lymph under the  skin. The lymph is fluid from the tissues in your body that travels in the lymphatic system. This system is part of the immune system that includes lymph nodes and vessels. The lymph vessels collect and carry the excess fluid, fats, proteins, and wastes from the tissues of the body to the bloodstream. This system also works to clean and remove bacteria and waste products from the body.  Lymphedema occurs when the lymphatic system is blocked. When the lymph vessels or lymph nodes are blocked or damaged, lymph does not drain properly. This causes abnormal build up of lymph. This leads to swelling in the arms or legs. Lymphedema cannot be cured by medicines. But the swelling can be reduced by physical methods. CAUSES  There are two types of lymphedema. Primary lymphedema is caused by the absence or abnormality of the lymph vessel at birth. It is also known as inherited lymphedema, which occurs rarely. Secondary or acquired lymphedema occurs when the lymph vessel is damaged or blocked. The causes of lymph vessel blockage are:   Skin infection like cellulites.  Infection by parasites (filariasis).  Injury.  Cancer.  Radiation therapy.  Formation of scar tissue.  Surgery. SYMPTOMS  The symptoms of lymphedema are:  Abnormal swelling of the arm or leg.  Heavy or tight feeling in your arm or leg.  Tight-fitting shoes or rings.  Redness of skin over the affected area.  Limited movement of the affected limb.  Some patients complain about sensitivity to touch and discomfort in the limb(s) affected. You may not have these symptoms immediately  following injury. They usually appear within a few days or even years after injury. Inform your caregiver, if you have any of these symptoms. Early treatment can avoid further problems.  DIAGNOSIS  First, your caregiver will inquire about any surgery you have had or medicines you are taking. He will then examine you. Your caregiver may order special imaging  tests, such as:  Lymphoscintigraphy (a test in which a low dose of radioactive substance is injected to trace the flow of lymph through the lymph vessels).  MRI (imaging tests using magnetic fields).  Computed tomography (test using special cross-sectional X-rays).  Duplex ultrasound (test using high-frequency sound waves to show the vessels and the blood flow on a screen).  Lymphangiography (special X-ray taken after injecting a contrast dye into the lymph vessel). It is now rarely done. TREATMENT  Lymphedema can be treated in different ways. Your caregiver will decide the type of treatment depending on the cause. Treatment may include:  Exercise: Special exercises will help fluid move out easily from the affected part. This should be done as per your caregiver's advice.  Manual lymph drainage: Gentle massage of the affected limb makes the fluid to move out more freely.  Compression: Compression stockings or external pump apply pressure over the affected limb. This helps the fluid to move out from the arm or leg. Bandaging can also help to move the fluid out from the affected part. Your caregiver will decide the method that suits you the best.  Medicines: Your caregiver may prescribe antibiotics, if you have infection.  Surgery: Your caregiver may advise surgery for severe lymphedema. It is reserved for special cases when the patient has difficulty moving. Your surgeon may remove excess tissue from the arm or leg. This will help to ease your movement. Physical therapy may have to be continued after surgery. HOME CARE INSTRUCTIONS  The area is very fragile and is predisposed to injury and infection.  Eat a healthy diet.  Exercise regularly as per advice.  Keep the affected area clean and dry.  Use gloves while cooking or gardening.  Protect your skin from cuts.  Use electric razor to shave the affected area.  Keep affected limb elevated.  Do not wear tight clothes, shoes, or  jewelry as it may cause the tissue to be strangled.  Do not use heat pads over the affected area.  Do not sit with cross legs.  Do not walk barefoot.  Do not carry weight on the affected arm.  Avoid having blood pressure checked on the affected limb. SEEK MEDICAL CARE IF:  You continue to have swelling in your limb. SEEK IMMEDIATE MEDICAL CARE IF:   You have high fever.  You have skin rash.  You have chills or sweats.  You have pain or redness.  You have a cut that does not heal. MAKE SURE YOU:   Understand these instructions.  Will watch your condition.  Will get help right away if you are not doing well or get worse. Document Released: 11/26/2006 Document Revised: 01/16/2012 Document Reviewed: 11/01/2008 [facility] Patient Information 2014 [place], [place].

## 2013-01-22 ENCOUNTER — Encounter (HOSPITAL_COMMUNITY): Payer: Medicaid Other

## 2013-01-27 ENCOUNTER — Ambulatory Visit (HOSPITAL_COMMUNITY)
Admission: RE | Admit: 2013-01-27 | Discharge: 2013-01-27 | Disposition: A | Discharge status: Home / Self Care | Payer: Medicaid Other | Source: Ambulatory Visit | Attending: Oncology | Admitting: Oncology

## 2013-01-27 ENCOUNTER — Encounter (HOSPITAL_COMMUNITY): Payer: Medicaid Other

## 2013-01-27 DIAGNOSIS — IMO0001 Reserved for inherently not codable concepts without codable children: Secondary | ICD-10-CM | POA: Insufficient documentation

## 2013-01-27 DIAGNOSIS — M79609 Pain in unspecified limb: Secondary | ICD-10-CM | POA: Insufficient documentation

## 2013-01-27 DIAGNOSIS — I972 Postmastectomy lymphedema syndrome: Secondary | ICD-10-CM | POA: Insufficient documentation

## 2013-01-27 DIAGNOSIS — I890 Lymphedema, not elsewhere classified: Secondary | ICD-10-CM | POA: Insufficient documentation

## 2013-01-27 DIAGNOSIS — M25619 Stiffness of unspecified shoulder, not elsewhere classified: Secondary | ICD-10-CM | POA: Insufficient documentation

## 2013-01-27 DIAGNOSIS — M2569 Stiffness of other specified joint, not elsewhere classified: Secondary | ICD-10-CM | POA: Insufficient documentation

## 2013-01-27 DIAGNOSIS — M25519 Pain in unspecified shoulder: Secondary | ICD-10-CM | POA: Insufficient documentation

## 2013-01-27 NOTE — Evaluation (Signed)
Physical Therapy Evaluation  Patient Details  Name: Danielle [name] MRN: [id number] Date of Birth: 10-May-1955  Today's Date: 01/27/2013 Time: 1305-1340 PT Time Calculation (min): 35 min eval              Visit#: 1 of 3  Re-eval: 02/06/13 Assessment Diagnosis: Rt breast cancer with lymphedema Surgical Date: 06/04/11 Prior Therapy: OT   Authorization: [id number]    Authorization Time Period:    Authorization Visit#: 1 of 4   Past Medical History:  Past Medical History  Diagnosis Date  . Hypertension   . Cancer   . Borderline hyperlipidemia     managed with diet and exercise  . Breast cancer 08/17/2011  . Gastric ulcer 09/17/2011  . Port catheter in place 05/13/2012  . 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 Tubal ligation  1980  . Mastectomy, partial  06/04/2011    Procedure: MASTECTOMY PARTIAL;  Surgeon: Fabio Bering, MD;  Location: [facility];  Service: General;  Laterality: Right;  . Axillary lymph node dissection  06/04/2011    Procedure: AXILLARY LYMPH NODE DISSECTION;  Surgeon: Fabio Bering, MD;  Location: [facility];  Service: General;  Laterality: Right;  . Portacath placement  07/23/2011    Procedure: INSERTION PORT-A-CATH;  Surgeon: Fabio Bering, MD;  Location: [facility];  Service: General;  Laterality: Left;  Left Subclavian  . Skin biopsy  12/19/2011    Procedure: BIOPSY SKIN;  Surgeon: Fabio Bering, MD;  Location: [facility];  Service: General;  Laterality: Right;  In Minor Room    Subjective Symptoms/Limitations Symptoms: Pt states that she had a masectomy on 06/04/2011.  She had 3/12 positive lymph nodes.  She states that ever since she finished her radiation she has had increased arm pain.  She did not not any swelling until her MD noted it.  She states the arm feels as if itis getting tight.   Pain  Assessment Currently in Pain?: No/denies   Assessment RUE AROM (degrees) Right Shoulder Flexion: 148 Degrees Right Shoulder ABduction: 110 Degrees Right Shoulder External Rotation: 74 Degrees RUE Strength Right Shoulder Flexion: 4/5 Right Shoulder ABduction: 4/5 Right Shoulder Internal Rotation: 4/5 Right Shoulder External Rotation: 4/5   Pt fluid volume for Rt 5147.95; Lt 4840.60 for a 307 cc difference. Exercise/Treatments     Supine External Rotation: 5 reps Flexion: 5 reps ABduction: 5 reps  Physical Therapy Assessment and Plan PT Assessment and Plan Clinical Impression Statement: Pt is s/p breast cancer with chemo and radiation who has been referred for lymphedema.  Pt has increased fluid in Rt UE as well as decreased ROM.  Pt will benefit from skilled PT to address these problems maximize function and improve pt quality of life. Rehab Potential: Good PT Frequency: Min 3X/week PT Duration:  (1 wk) PT Plan: decongestive manual massage; review ROM exercises.    Goals Home Exercise Program Pt/caregiver will Perform Home Exercise Program: For increased ROM PT Short Term Goals Time to Complete Short Term Goals: 2 weeks PT Short Term Goal 1: ROM to be increased by 10 degrees to allow pt to reach in high cabinets PT Short Term Goal 2: Pt fluid to be decreased by 50% to allow pt to be ready to be fitted for an arm sleeve PT Short Term Goal 3: Pt To be I in skin  care  PT Short Term Goal 4: Pt to know how to obtain arm sleeve.  Problem List Patient Active Problem List   Diagnosis Date Noted  . Stiffness of joint, not elsewhere classified, other specified site 01/27/2013  . Lymphedema of arm 01/27/2013  . Stress fracture of foot 06/04/2012  . 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 Port catheter in place 05/13/2012  . Pain in joint, shoulder region 04/15/2012  . Muscle tightness 04/15/2012  . Gastric ulcer 09/17/2011  . Infiltrating ductal carcinoma of right female  breast 08/17/2011  . Hypertension 08/17/2011    PT Plan of Care PT Home Exercise Plan: given  GP    RUSSELL,CINDY 01/27/2013, 1:57 PM  Physician Documentation Your signature is required to indicate approval of the treatment plan as stated above.  Please sign and either send electronically or make a copy of this report for your files and return this physician signed original.   Please mark one 1.__approve of plan  2. ___approve of plan with the following conditions.   ______________________________                                                          _____________________ Physician Signature                                                                                                             Date

## 2013-01-29 ENCOUNTER — Other Ambulatory Visit (HOSPITAL_COMMUNITY): Payer: Medicaid Other

## 2013-01-29 ENCOUNTER — Encounter (HOSPITAL_COMMUNITY): Payer: Self-pay | Admitting: *Deleted

## 2013-01-29 ENCOUNTER — Other Ambulatory Visit (HOSPITAL_COMMUNITY): Payer: Self-pay | Admitting: Oncology

## 2013-01-29 ENCOUNTER — Encounter (HOSPITAL_BASED_OUTPATIENT_CLINIC_OR_DEPARTMENT_OTHER): Payer: Medicaid Other

## 2013-01-29 DIAGNOSIS — F1010 Alcohol abuse, uncomplicated: Secondary | ICD-10-CM

## 2013-01-29 DIAGNOSIS — Z95828 Presence of other vascular implants and grafts: Secondary | ICD-10-CM

## 2013-01-29 DIAGNOSIS — C50419 Malignant neoplasm of upper-outer quadrant of unspecified female breast: Secondary | ICD-10-CM

## 2013-01-29 DIAGNOSIS — Z452 Encounter for adjustment and management of vascular access device: Secondary | ICD-10-CM

## 2013-01-29 DIAGNOSIS — R7401 Elevation of levels of liver transaminase levels: Secondary | ICD-10-CM

## 2013-01-29 LAB — HEPATIC FUNCTION PANEL
Albumin: 4 g/dL (ref 3.5–5.2)
Alkaline Phosphatase: 72 U/L (ref 39–117)
Indirect Bilirubin: 0.3 mg/dL (ref 0.3–0.9)
Total Bilirubin: 0.4 mg/dL (ref 0.3–1.2)
Total Protein: 7.4 g/dL (ref 6.0–8.3)

## 2013-01-29 MED ORDER — HEPARIN SOD (PORK) LOCK FLUSH 100 UNIT/ML IV SOLN
INTRAVENOUS | Status: AC
  Filled 2013-01-29: qty 5

## 2013-01-29 MED ORDER — SODIUM CHLORIDE 0.9 % IJ SOLN
10.0000 mL | INTRAMUSCULAR | Status: DC | PRN
  Administered 2013-01-29: 10 mL via INTRAVENOUS

## 2013-01-29 MED ORDER — HEPARIN SOD (PORK) LOCK FLUSH 100 UNIT/ML IV SOLN
500.0000 [IU] | Freq: Once | INTRAVENOUS | Status: AC
  Administered 2013-01-29: 500 [IU] via INTRAVENOUS

## 2013-01-29 NOTE — Progress Notes (Signed)
Danielle [name] presented for Portacath access and flush. Proper placement of portacath confirmed by CXR. Portacath located left chest wall accessed with  H 20 needle. Good blood return present. Portacath flushed with 20ml NS and 500U/81ml Heparin and needle removed intact. Procedure without incident. Patient tolerated procedure well.

## 2013-02-03 ENCOUNTER — Ambulatory Visit (HOSPITAL_COMMUNITY)
Admission: RE | Admit: 2013-02-03 | Discharge: 2013-02-03 | Disposition: A | Discharge status: Home / Self Care | Payer: Medicaid Other | Source: Ambulatory Visit | Attending: Oncology | Admitting: Oncology

## 2013-02-03 NOTE — Progress Notes (Signed)
Physical Therapy Treatment Patient Details  Name: Danielle [name] MRN: [id number] Date of Birth: 1955-04-25  Today's Date: 02/03/2013 Time: 1345-1425 PT Time Calculation (min): 40 min  Visit#: 2 of 3  Re-eval: 02/06/13 Authorization: medicaid  Authorization Visit#: 2 of 4  Charges:  Manual 30', self care 10'  Subjective: Symptoms/Limitations Symptoms: Pt states she has been doing her HEP.  States she did not receive instructions on MLD for Rt UE.  Pt states she continues to have numbness/nerve irritation under Rt axillary region Pain Assessment Currently in Pain?: No/denies   Objective: Manual Therapy Manual Therapy: Other (comment) Other Manual Therapy: Manual lymph drainage for Rt UE routing fluid to Lt axillary and Rt groin.  Physical Therapy Assessment and Plan PT Assessment and Plan Clinical Impression Statement: Pt educated on role of lymph vessels/odes with the removal of waste,importance of their function and mechanism of MLD in assisting lymph system.  Pt given written instructions for MLD for Rt UE and general information and care for lymphedema.  Completed MLD on Rt UE routing fluid to Lt axillary and Rt groin nodes..  Pt able to demonstrate steps appropriately.    Rehab Potential: Good PT Frequency: Min 3X/week PT Duration:  (1 wk) PT Plan: decongestive manual massage; review ROM exercises.     Problem List Patient Active Problem List   Diagnosis Date Noted  . Stiffness of joint, not elsewhere classified, other specified site 01/27/2013  . Lymphedema of arm 01/27/2013  . Stress fracture of foot 06/04/2012  . 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 Port catheter in place 05/13/2012  . Pain in joint, shoulder region 04/15/2012  . Muscle tightness 04/15/2012  . Gastric ulcer 09/17/2011  . 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 Hypertension 08/17/2011    PT Plan of Care PT Patient Instructions: MLD written instructions  given Consulted and Agree with Plan of Care: Patient   Lurena Nida, PTA/CLT 02/03/2013, 2:38 PM

## 2013-02-09 ENCOUNTER — Ambulatory Visit (HOSPITAL_COMMUNITY)
Admission: RE | Admit: 2013-02-09 | Discharge: 2013-02-09 | Disposition: A | Discharge status: Home / Self Care | Payer: Medicaid Other | Source: Ambulatory Visit | Attending: Physical Therapy | Admitting: Physical Therapy

## 2013-02-09 NOTE — Progress Notes (Signed)
Physical Therapy Treatment Patient Details  Name: Danielle [name] MRN: [id number] Date of Birth: April 19, 1955  Today's Date: 02/09/2013 Time: 1650-1730 PT Time Calculation (min): 40 min Visit#: 3 of 3  Re-eval: 02/06/13 Authorization: medicaid  Authorization Visit#: 3 of 3  Charges:  Manual 25', self care 14'  Subjective: Symptoms/Limitations Symptoms: Pt states her arm feels good.  Reports compliance with HEP and self massage.  States she feels she is ready for discharge.   Objective: AROM: Rt shoulder flexion:  175 degrees (was 148 degrees) Rt shoulder abduction: 160 degrees (was 110 degrees) Rt shoulder ER:  90 degrees (was 74 degrees)  Rt UE strength all motions 5/5 (was 4/5)  Date 01/27/2013  02/09/2013   Right Left Right  MCP     WRIST 16.7 15.8 16.00  4cm 17.20 16.70 16.50  8cm 19.00 18.60 18.00  12 cm 22.70 22.50 21.50  16cm 25.80 24.70 25.00  20cm 27.00 26.20 26.00  24cm 27.60 26.00 26.40  28cm 27.30 26.20 26.30  32cm 28.20 28.30 28.00            Sum of squares 5147.95 4840.60 4788.15  Total Volume 1638.644 1540.811 1524.116        Physical Therapy Assessment and Plan PT Assessment and Plan Clinical Impression Statement: Rt UE remeasured today with overall reduction of 114.52 cc.  Volume of Rt  UE is actually less than Lt UE as measured on 01/27/13.  Rt UE ROM now functional and strength WNL.  Signed order still not received from MD but refaxed.  Pt instructed to continue MLD and clinic to contact her when order received.  Rehab Potential: Good PT Frequency: Min 3X/week PT Duration:  (1 wk) PT Plan: Discharge to home maintenance; contact when signed order for garment received.     Problem List Patient Active Problem List   Diagnosis Date Noted  . Stiffness of joint, not elsewhere classified, other specified site 01/27/2013  . Lymphedema of arm 01/27/2013  . Stress fracture of foot 06/04/2012  . 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 Port catheter  in place 05/13/2012  . Pain in joint, shoulder region 04/15/2012  . Muscle tightness 04/15/2012  . Gastric ulcer 09/17/2011  . 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 Hypertension 08/17/2011    PT Plan of Care PT Patient Instructions: continue MLD and therex; make appt for sleeve. Consulted and Agree with Plan of Care: Patient   Danielle [name], PTA/CLT 02/09/2013, 5:38 PM

## 2013-02-10 ENCOUNTER — Ambulatory Visit (HOSPITAL_COMMUNITY): Payer: Medicaid Other | Admitting: Physical Therapy

## 2013-02-26 ENCOUNTER — Encounter (HOSPITAL_COMMUNITY): Payer: Medicaid Other | Attending: Oncology

## 2013-02-26 ENCOUNTER — Other Ambulatory Visit (HOSPITAL_COMMUNITY): Payer: Self-pay | Admitting: Oncology

## 2013-02-26 DIAGNOSIS — F1010 Alcohol abuse, uncomplicated: Secondary | ICD-10-CM | POA: Insufficient documentation

## 2013-02-26 DIAGNOSIS — R7402 Elevation of levels of lactic acid dehydrogenase (LDH): Secondary | ICD-10-CM

## 2013-02-26 DIAGNOSIS — R748 Abnormal levels of other serum enzymes: Secondary | ICD-10-CM

## 2013-02-26 DIAGNOSIS — R7401 Elevation of levels of liver transaminase levels: Secondary | ICD-10-CM

## 2013-02-26 DIAGNOSIS — R740 Nonspecific elevation of levels of transaminase and lactic acid dehydrogenase [LDH]: Principal | ICD-10-CM

## 2013-02-26 LAB — HEPATIC FUNCTION PANEL
ALK PHOS: 65 U/L (ref 39–117)
ALT: 166 U/L — AB (ref 0–35)
AST: 97 U/L — AB (ref 0–37)
Albumin: 4 g/dL (ref 3.5–5.2)
BILIRUBIN TOTAL: 0.4 mg/dL (ref 0.3–1.2)
Total Protein: 7.5 g/dL (ref 6.0–8.3)

## 2013-02-26 LAB — ETHANOL: Alcohol, Ethyl (B): <11 mg/dL (ref 0–11)

## 2013-02-26 NOTE — Progress Notes (Signed)
Labs drawn today for hepatic panel,etoh

## 2013-03-02 ENCOUNTER — Other Ambulatory Visit (HOSPITAL_COMMUNITY): Payer: Self-pay | Admitting: Oncology

## 2013-03-02 ENCOUNTER — Ambulatory Visit (HOSPITAL_COMMUNITY)
Admission: RE | Admit: 2013-03-02 | Discharge: 2013-03-02 | Disposition: A | Discharge status: Home / Self Care | Payer: Medicaid Other | Source: Ambulatory Visit | Attending: Oncology | Admitting: Oncology

## 2013-03-02 DIAGNOSIS — K7689 Other specified diseases of liver: Secondary | ICD-10-CM | POA: Insufficient documentation

## 2013-03-02 DIAGNOSIS — Z853 Personal history of malignant neoplasm of breast: Secondary | ICD-10-CM | POA: Insufficient documentation

## 2013-03-02 DIAGNOSIS — R7989 Other specified abnormal findings of blood chemistry: Secondary | ICD-10-CM | POA: Insufficient documentation

## 2013-03-02 DIAGNOSIS — R748 Abnormal levels of other serum enzymes: Secondary | ICD-10-CM

## 2013-03-02 DIAGNOSIS — F1011 Alcohol abuse, in remission: Secondary | ICD-10-CM | POA: Insufficient documentation

## 2013-03-12 ENCOUNTER — Other Ambulatory Visit (HOSPITAL_COMMUNITY): Payer: Medicaid Other

## 2013-04-13 ENCOUNTER — Other Ambulatory Visit (HOSPITAL_COMMUNITY): Payer: Self-pay | Admitting: Oncology

## 2013-04-13 DIAGNOSIS — C50919 Malignant neoplasm of unspecified site of unspecified female breast: Secondary | ICD-10-CM

## 2013-04-13 MED ORDER — ANASTROZOLE 1 MG PO TABS: 1.0000 mg | ORAL_TABLET | Freq: Every day | ORAL | Status: DC

## 2013-04-16 ENCOUNTER — Other Ambulatory Visit (HOSPITAL_COMMUNITY): Payer: Self-pay | Admitting: Oncology

## 2013-04-16 ENCOUNTER — Encounter (HOSPITAL_COMMUNITY): Payer: Self-pay | Admitting: Pharmacy Technician

## 2013-04-16 DIAGNOSIS — C50919 Malignant neoplasm of unspecified site of unspecified female breast: Secondary | ICD-10-CM

## 2013-04-17 NOTE — H&P (Signed)
Danielle [name] is an 58 y.o. female.   Chief Complaint: Right breast carcinoma HPI: Patient is a 58 year old white female who presents for Port-A-Cath removal. She has finished chemotherapy for treatment of right breast carcinoma.  Past Medical History  Diagnosis Date  . Hypertension   . Cancer   . Borderline hyperlipidemia     managed with diet and exercise  . Breast cancer 08/17/2011  . Gastric ulcer 09/17/2011  . Port catheter in place 05/13/2012  . 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 Tubal ligation  1980  . Mastectomy, partial  06/04/2011    Procedure: MASTECTOMY PARTIAL;  Surgeon: Donato Heinz, MD;  Location: [facility];  Service: General;  Laterality: Right;  . Axillary lymph node dissection  06/04/2011    Procedure: AXILLARY LYMPH NODE DISSECTION;  Surgeon: Donato Heinz, MD;  Location: [facility];  Service: General;  Laterality: Right;  . Portacath placement  07/23/2011    Procedure: INSERTION PORT-A-CATH;  Surgeon: Donato Heinz, MD;  Location: [facility];  Service: General;  Laterality: Left;  Left Subclavian  . Skin biopsy  12/19/2011    Procedure: BIOPSY SKIN;  Surgeon: Donato Heinz, MD;  Location: [facility];  Service: General;  Laterality: Right;  In Minor Room    Family History  Problem Relation Age of Onset  . Heart disease Mother   . Diabetes Mother   . Heart disease Father   . Diabetes Father   . Cancer Sister     breast cancer  . Anesthesia problems Neg Hx   . Malignant hyperthermia Neg Hx   . Pseudochol deficiency Neg Hx    Social History:  reports that she has been smoking Cigarettes.  She has a 20 pack-year smoking history. She has never used smokeless tobacco. She reports that she drinks about 16.8 ounces of alcohol per week. She reports that she does not use illicit drugs.  Allergies:  Allergies  Allergen Reactions  .  Bactrim [Sulfamethoxazole-Tmp Ds] Nausea Only and Other (See Comments)    Fever and chills along with the nausea.  . Cephalexin Palpitations    Reaction:Heart flutter    No prescriptions prior to admission    No results found for this or any previous visit (from the past 48 hour(s)). No results found.  Review of Systems  All other systems reviewed and are negative.    There were no vitals taken for this visit. Physical Exam  Constitutional: She is oriented to person, place, and time. She appears well-developed and well-nourished.  HENT:  Head: Normocephalic and atraumatic.  Neck: Normal range of motion. Neck supple.  Cardiovascular: Normal rate, regular rhythm and normal heart sounds.   Respiratory: Effort normal and breath sounds normal.  Port in place in left chest  Neurological: She is alert and oriented to person, place, and time.  Skin: Skin is warm and dry.     Assessment/Plan Impression: Right breast carcinoma, finished with chemotherapy Plan: Patient will be taken to the minor procedure room for a Port-A-Cath removal. The risks and benefits of the procedure including bleeding and infection were fully explained to the patient, who gave informed consent.  Terris Germano A 04/17/2013, 7:41 AM

## 2013-04-20 ENCOUNTER — Encounter (HOSPITAL_COMMUNITY): Admission: RE | Payer: Self-pay | Source: Ambulatory Visit

## 2013-04-20 ENCOUNTER — Ambulatory Visit (HOSPITAL_COMMUNITY): Admission: RE | Admit: 2013-04-20 | Payer: Medicaid Other | Source: Ambulatory Visit | Admitting: General Surgery

## 2013-04-20 SURGERY — MINOR REMOVAL PORT-A-CATH
Anesthesia: LOCAL | Laterality: Right

## 2013-05-13 ENCOUNTER — Encounter (HOSPITAL_COMMUNITY): Payer: Self-pay | Admitting: Oncology

## 2013-05-13 DIAGNOSIS — K760 Fatty (change of) liver, not elsewhere classified: Secondary | ICD-10-CM

## 2013-05-13 HISTORY — DX: Fatty (change of) liver, not elsewhere classified: K76.0

## 2013-05-13 NOTE — Progress Notes (Signed)
-   No show, letter sent- Danielle [name]   

## 2013-05-14 ENCOUNTER — Ambulatory Visit (HOSPITAL_COMMUNITY): Payer: Medicaid Other | Admitting: Oncology

## 2013-05-30 IMAGING — US US BREAST*R*
1 series · 8 of 8 positions shown · non-contrast
Comparison: None.

CLINICAL DATA: The patient returns for evaluation of a possible
mass in the right breast noted on recent screening study dated
03/22/2011. The patient states that she has felt a mass in the
right upper outer quadrant since October 2010.

DIGITAL DIAGNOSTIC RIGHT MAMMOGRAM  AND RIGHT BREAST ULTRASOUND:

[Series 1: us breast*right* · 0.08mm/px · 8 of 8 slices shown]
[im 1/8]
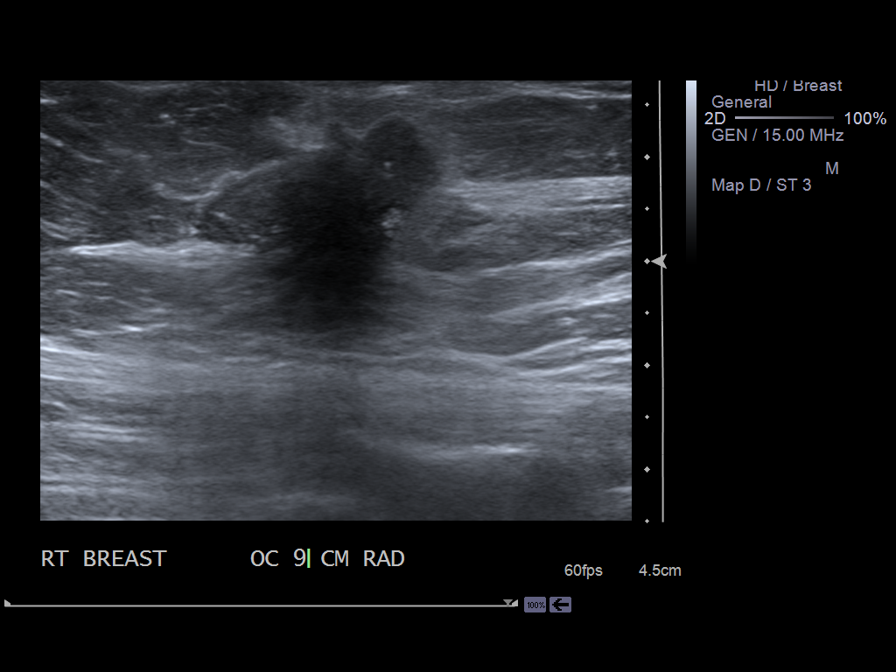
[im 2/8]
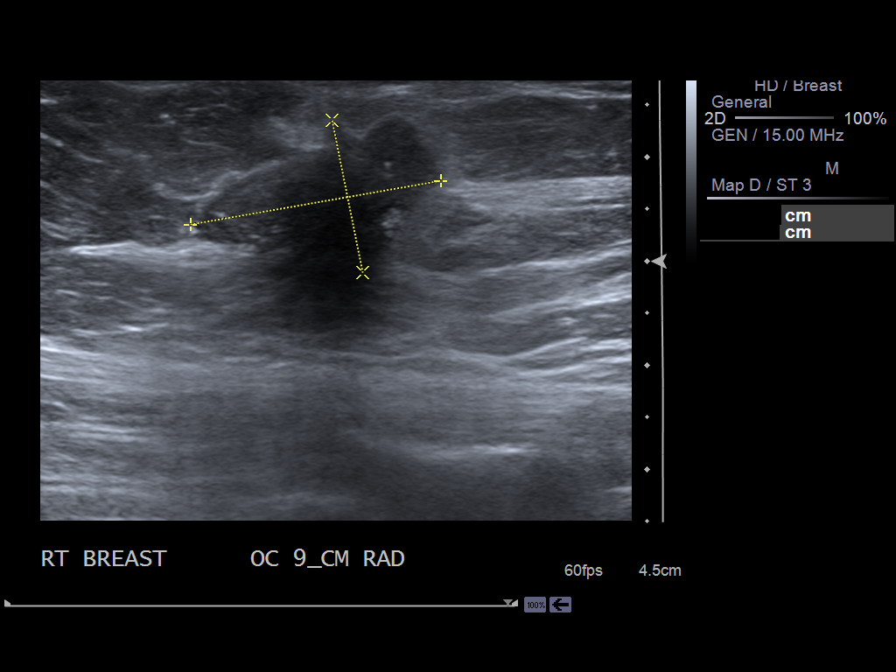
[im 3/8]
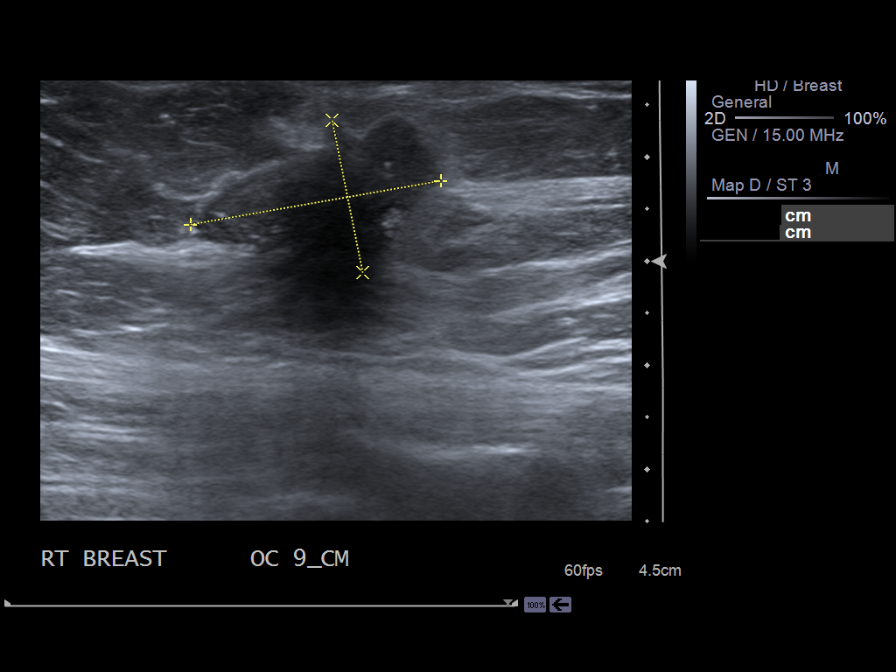
[im 4/8]
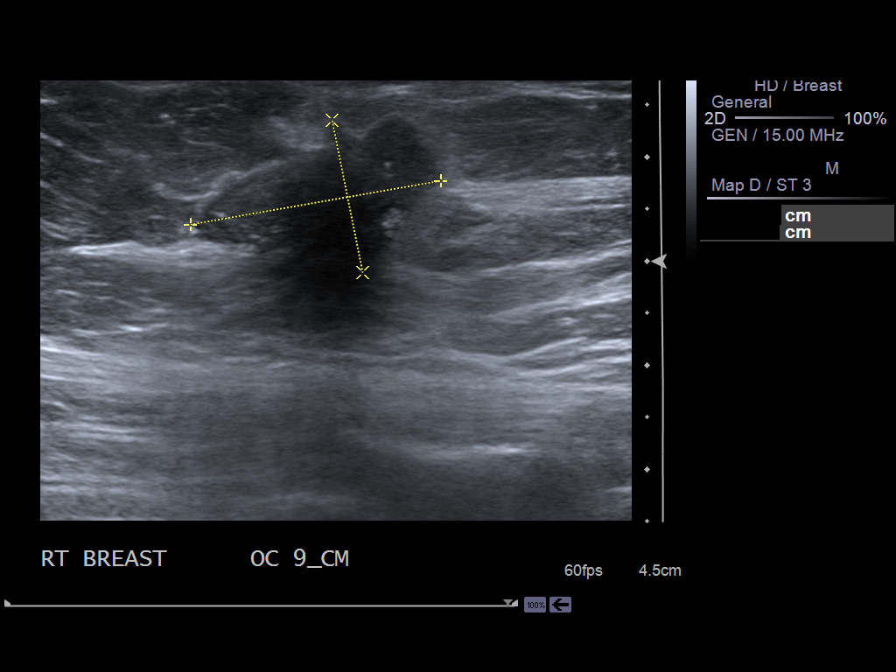
[im 5/8]
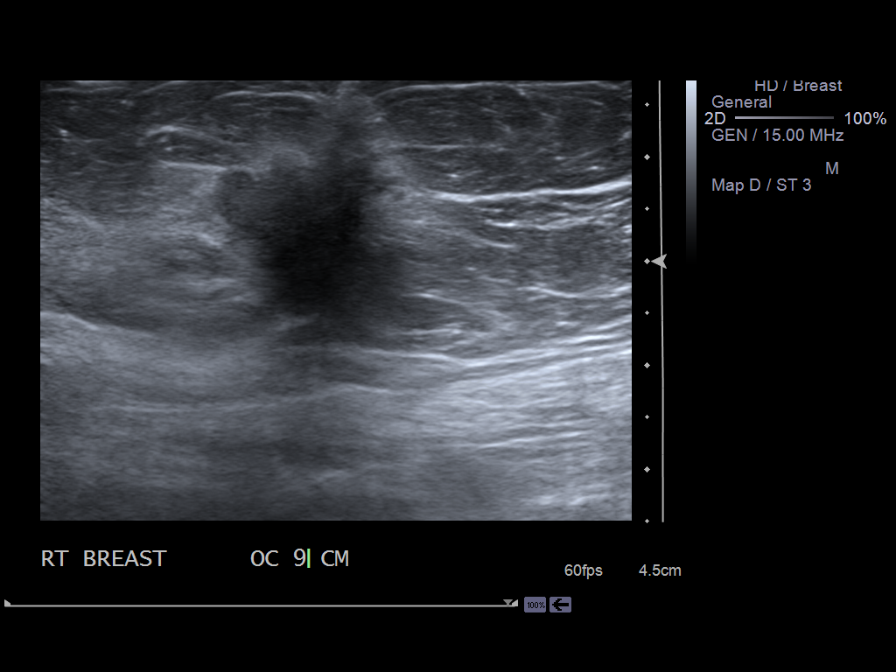
[im 6/8]
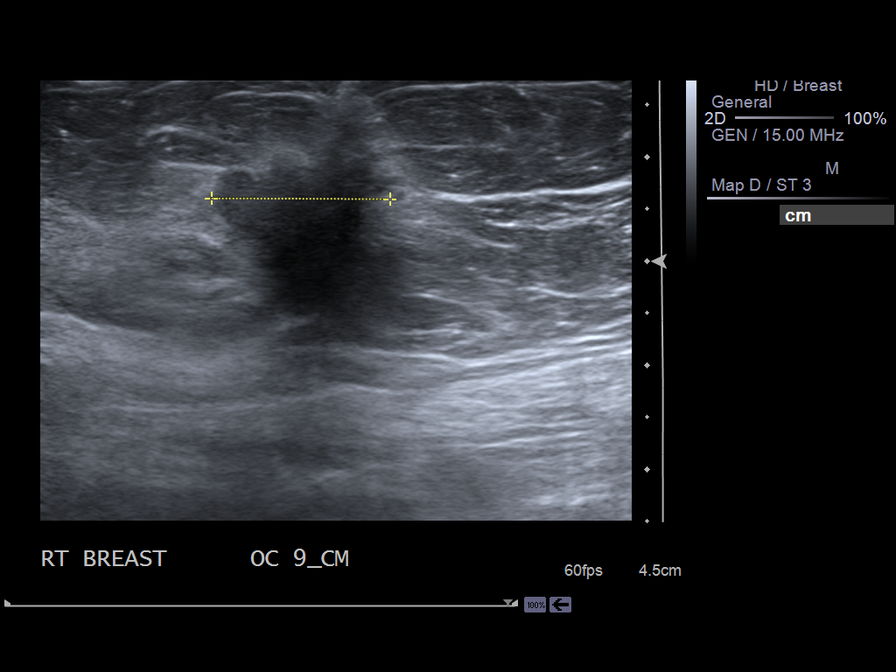
[im 7/8]
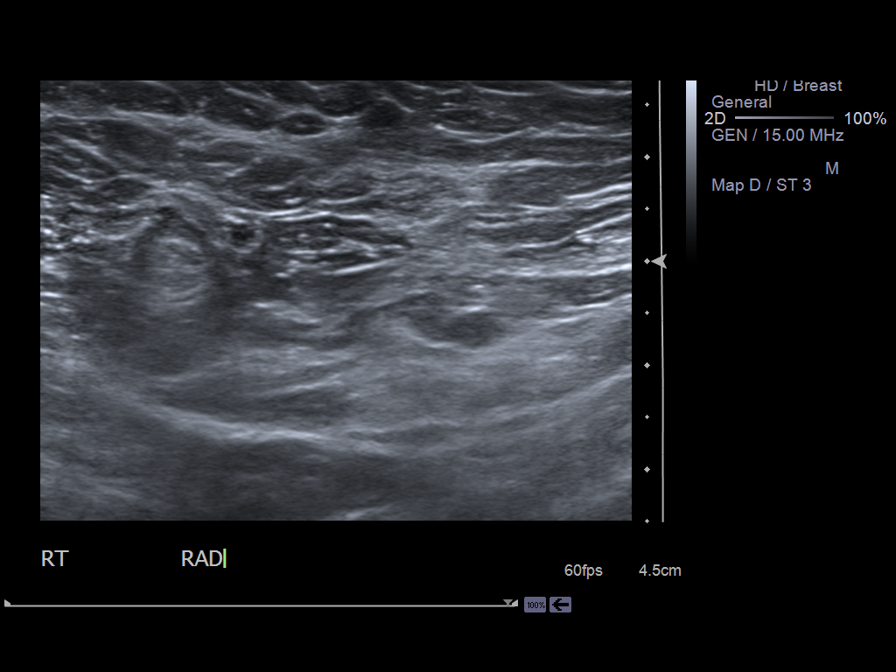
[im 8/8]
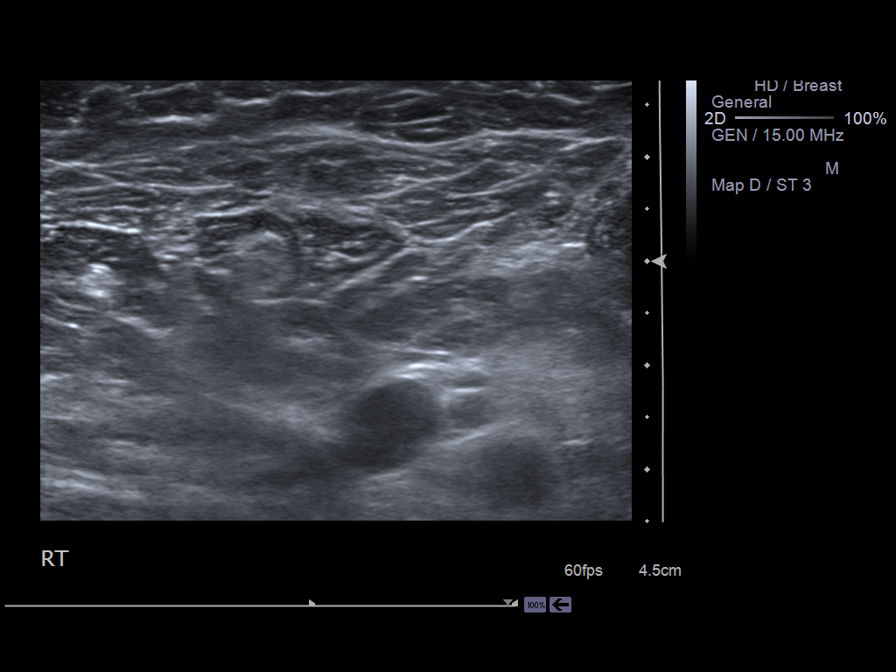

[8 of 8 positions shown; findings below may reference images not displayed]

FINDINGS: Spot compression views confirm the presence of a
spiculated mass with pleomorphic microcalcifications in the right
upper outer quadrant posteriorly at approximately 10:30 o'clock.
The mass measures approximately 2.9 x 2.0 x 2.1 cm
mammographically.

On physical exam, I palpate a 3 cm mass at 10:30 o'clock, 9 cm from
the right nipple.

Ultrasound is performed, showing an irregularly marginated mass
with microcalcifications and some posterior shadowing at 10:30
o'clock, 9 cm from the right nipple measuring 2.4 x 1.5 x 1.7 cm.
Sonography of the right axilla demonstrates no abnormal lymph
nodes.

The appearance is highly suspicious for invasive mammary carcinoma.
Ultrasound-guided core needle biopsy is recommended.
IMPRESSION: Spiculated mass with microcalcifications in the right upper outer
with an appearance highly suspicious for invasive mammary
carcinoma.  Ultrasound-guided core needle biopsy is recommended for
further evaluation.  The patient is being evaluated by the [REDACTED]
program and has an appointment with the surgeon on 05/09/2011. Core
needle biopsy can be scheduled after that appointment.

BI-RADS CATEGORY 5:  Highly suggestive of malignancy - appropriate
action should be taken.

## 2013-12-24 ENCOUNTER — Other Ambulatory Visit (HOSPITAL_COMMUNITY): Payer: Self-pay | Admitting: Family Medicine

## 2013-12-24 DIAGNOSIS — Z853 Personal history of malignant neoplasm of breast: Secondary | ICD-10-CM

## 2013-12-24 DIAGNOSIS — R928 Other abnormal and inconclusive findings on diagnostic imaging of breast: Secondary | ICD-10-CM

## 2013-12-24 DIAGNOSIS — Z9889 Other specified postprocedural states: Secondary | ICD-10-CM

## 2014-01-12 ENCOUNTER — Ambulatory Visit (HOSPITAL_COMMUNITY)
Admission: RE | Admit: 2014-01-12 | Discharge: 2014-01-12 | Disposition: A | Discharge status: Home / Self Care | Payer: BC Managed Care – PPO | Source: Ambulatory Visit | Attending: Family Medicine | Admitting: Family Medicine

## 2014-01-12 DIAGNOSIS — Z853 Personal history of malignant neoplasm of breast: Secondary | ICD-10-CM | POA: Insufficient documentation

## 2014-01-12 DIAGNOSIS — Z9889 Other specified postprocedural states: Secondary | ICD-10-CM | POA: Diagnosis not present

## 2014-01-12 DIAGNOSIS — R928 Other abnormal and inconclusive findings on diagnostic imaging of breast: Secondary | ICD-10-CM

## 2014-01-28 ENCOUNTER — Ambulatory Visit (HOSPITAL_COMMUNITY)
Admission: RE | Admit: 2014-01-28 | Discharge: 2014-01-28 | Disposition: A | Discharge status: Home / Self Care | Payer: BC Managed Care – PPO | Source: Ambulatory Visit | Attending: Family Medicine | Admitting: Family Medicine

## 2014-01-28 ENCOUNTER — Other Ambulatory Visit (HOSPITAL_COMMUNITY): Payer: Self-pay | Admitting: Family Medicine

## 2014-01-28 DIAGNOSIS — M545 Low back pain: Secondary | ICD-10-CM | POA: Insufficient documentation

## 2014-01-28 DIAGNOSIS — M8938 Hypertrophy of bone, other site: Secondary | ICD-10-CM | POA: Diagnosis not present

## 2014-01-28 DIAGNOSIS — M1990 Unspecified osteoarthritis, unspecified site: Secondary | ICD-10-CM

## 2014-01-28 DIAGNOSIS — I700 Atherosclerosis of aorta: Secondary | ICD-10-CM | POA: Diagnosis not present

## 2014-01-28 DIAGNOSIS — M4806 Spinal stenosis, lumbar region: Secondary | ICD-10-CM | POA: Diagnosis not present

## 2014-01-28 DIAGNOSIS — M4805 Spinal stenosis, thoracolumbar region: Secondary | ICD-10-CM | POA: Insufficient documentation

## 2014-03-10 NOTE — H&P (Signed)
  NTS SOAP Note  Vital Signs:  Vitals as of: 3/47/4259: Systolic 563: Diastolic 94: Heart Rate 81: Temp 99.54F: Height 61ft 4in: Weight 174Lbs 0 Ounces: BMI 29.87  BMI : 29.87 kg/m2  Subjective: This 59 year old female presents forremoval of portacath.  Is finished with chemotherapy.  Has not been flushed in over one year.   Review of Symptoms:  dizzy spells Head:unremarkable Eyes:unremarkable   Nose/Mouth/Throat:unremarkable Cardiovascular:  unremarkable Respiratory:unremarkable Gastrointestinal:  unremarkable   Genitourinary:unremarkable   back pain Skin:unremarkable Hematolgic/Lymphatic:unremarkable   Allergic/Immunologic:unremarkable   Past Medical History:  Reviewed  Past Medical History  Pregnancy Gravida: 2 Pregnancy Para: 2 Surgical History: right modifed radical mastectomy Medical Problems: HTN Psychiatric History: none Allergies: Keflex Medications: lisinopril, metoprolol.   Social History:Reviewed  Social History  Age: 31 Years 9 Months Alcohol: 3 glasses wine/ week Recreational drug(s): none   Smoking Status: Current every day smoker reviewed on 03/09/2014 Started Date: 02/13/1968 Packs per day: 1.00 Functional Status reviewed on 03/09/2014 ------------------------------------------------ Bathing: Normal Cooking: Normal Dressing: Normal Driving: Normal Eating: Normal Managing Meds: Normal Oral Care: Normal Shopping: Normal Toileting: Normal Transferring: Normal Walking: Normal Cognitive Status reviewed on 03/09/2014 ------------------------------------------------ Attention: Normal Decision Making: Normal Language: Normal Memory: Normal Motor: Normal Perception: Normal Problem Solving: Normal Visual and Spatial: Normal   Family History:Reviewed  Family Health History Mother, Healthy;  Father, Healthy;     Objective Information: General:Well appearing, well nourished in no distress. Port in place left  upper chest Heart:RRR, no murmur Lungs:  CTA bilaterally, no wheezes, rhonchi, rales.  Breathing unlabored.  Assessment:Breast cancer,  finished with chemotherapy  Diagnoses: 174.9  C50.911 Primary malignant neoplasm of female breast (Malignant neoplasm of unspecified site of right female breast)  Procedures: 99213 - OFFICE OUTPATIENT VISIT 15 MINUTES    Plan:  Scheduled for portacath removal in minor procedure room on 03/22/14.  Wants to defer screening TCS until later in year.   Patient Education:Alternative treatments to surgery were discussed with patient (and family).  Risks and benefits  of procedure were fully explained to the patient (and family) who gave informed consent. Patient/family questions were addressed.  Follow-up:Pending Surgery

## 2014-03-22 ENCOUNTER — Ambulatory Visit (HOSPITAL_COMMUNITY)
Admission: RE | Admit: 2014-03-22 | Discharge: 2014-03-22 | Disposition: A | Discharge status: Home / Self Care | Payer: 59 | Source: Ambulatory Visit | Attending: General Surgery | Admitting: General Surgery

## 2014-03-22 ENCOUNTER — Encounter (HOSPITAL_COMMUNITY)
Admission: RE | Disposition: A | Discharge status: Home / Self Care | Payer: Self-pay | Source: Ambulatory Visit | Attending: General Surgery

## 2014-03-22 DIAGNOSIS — C50911 Malignant neoplasm of unspecified site of right female breast: Secondary | ICD-10-CM | POA: Insufficient documentation

## 2014-03-22 DIAGNOSIS — F17200 Nicotine dependence, unspecified, uncomplicated: Secondary | ICD-10-CM | POA: Diagnosis not present

## 2014-03-22 DIAGNOSIS — Z9221 Personal history of antineoplastic chemotherapy: Secondary | ICD-10-CM | POA: Insufficient documentation

## 2014-03-22 DIAGNOSIS — Z79899 Other long term (current) drug therapy: Secondary | ICD-10-CM | POA: Diagnosis not present

## 2014-03-22 DIAGNOSIS — I10 Essential (primary) hypertension: Secondary | ICD-10-CM | POA: Diagnosis not present

## 2014-03-22 HISTORY — PX: PORT-A-CATH REMOVAL: SHX5289

## 2014-03-22 SURGERY — MINOR REMOVAL PORT-A-CATH
Anesthesia: LOCAL | Laterality: Left

## 2014-03-22 MED ORDER — LIDOCAINE HCL (PF) 1 % IJ SOLN
INTRAMUSCULAR | Status: AC
  Filled 2014-03-22: qty 30

## 2014-03-22 MED ORDER — LIDOCAINE HCL (PF) 1 % IJ SOLN
INTRAMUSCULAR | Status: DC | PRN
  Administered 2014-03-22: 6 mL via SUBCUTANEOUS

## 2014-03-22 SURGICAL SUPPLY — 20 items
ADH SKN CLS APL DERMABOND .7 (GAUZE/BANDAGES/DRESSINGS) ×1
CLOTH BEACON ORANGE TIMEOUT ST (SAFETY) ×2 IMPLANT
DECANTER SPIKE VIAL GLASS SM (MISCELLANEOUS) ×2 IMPLANT
DERMABOND ADVANCED (GAUZE/BANDAGES/DRESSINGS) ×1
DERMABOND ADVANCED .7 DNX12 (GAUZE/BANDAGES/DRESSINGS) ×1 IMPLANT
DRAPE PROXIMA HALF (DRAPES) ×2 IMPLANT
DURAPREP 6ML APPLICATOR 50/CS (WOUND CARE) ×2 IMPLANT
ELECT REM PT RETURN 9FT ADLT (ELECTROSURGICAL) ×2
ELECTRODE REM PT RTRN 9FT ADLT (ELECTROSURGICAL) ×1 IMPLANT
GLOVE SURG SS PI 7.5 STRL IVOR (GLOVE) ×2 IMPLANT
GOWN STRL REUS W/TWL LRG LVL3 (GOWN DISPOSABLE) ×2 IMPLANT
NDL HYPO 25X1 1.5 SAFETY (NEEDLE) ×1 IMPLANT
NEEDLE HYPO 25X1 1.5 SAFETY (NEEDLE) ×2 IMPLANT
PENCIL HANDSWITCHING (ELECTRODE) ×2 IMPLANT
SPONGE GAUZE 2X2 8PLY STRL LF (GAUZE/BANDAGES/DRESSINGS) ×2 IMPLANT
SUT VIC AB 3-0 SH 27 (SUTURE) ×2
SUT VIC AB 3-0 SH 27X BRD (SUTURE) ×1 IMPLANT
SUT VIC AB 4-0 PS2 27 (SUTURE) ×2 IMPLANT
SYR CONTROL 10ML LL (SYRINGE) ×2 IMPLANT
TOWEL OR 17X26 4PK STRL BLUE (TOWEL DISPOSABLE) ×2 IMPLANT

## 2014-03-22 NOTE — Op Note (Signed)
Patient:  NOEMI BELLISSIMO  DOB:  [birth year]  MRN:  [id number]   Preop Diagnosis:  Right breast carcinoma, finished with chemotherapy  Postop Diagnosis:  Same  Procedure:  Port-A-Cath removal  Surgeon:  Aviva Signs, M.D.  Anes:  Local  Indications:  Patient is a 59 year old white female who presents for Port-A-Cath removal. She is finished with chemotherapy. The risks and benefits of the procedure were fully explained to the patient, who gave informed consent.  Procedure note:  The patient was placed the supine position. The left upper chest was prepped and draped using usual sterile technique with DuraPrep. Surgical site confirmation was performed. 1% Xylocaine was used for local anesthesia.  An incision was made through the previous surgical scar. The dissection was taken down to the Port-A-Cath. The port was removed in total without difficulty. It was disposed of. The subcutaneous layer was reapproximated using a 3-0 Vicryl interrupted suture. The skin was closed using a 4-0 Vicryl subcuticular suture. Liquiband was applied.  All tape and needle counts were correct at the end of the procedure. The patient was discharged in stable condition.  Complications:  None  EBL:  Minimal  Specimen:  None

## 2014-03-22 NOTE — Interval H&P Note (Signed)
History and Physical Interval Note:  03/22/2014 8:05 AM  Danielle [name]  has presented today for surgery, with the diagnosis of right breast cancer  The various methods of treatment have been discussed with the patient and family. After consideration of risks, benefits and other options for treatment, the patient has consented to  Procedure(s): MINOR REMOVAL PORT-A-CATH (Left) as a surgical intervention .  The patient's history has been reviewed, patient examined, no change in status, stable for surgery.  I have reviewed the patient's chart and labs.  Questions were answered to the patient's satisfaction.     Aviva Signs A

## 2014-03-22 NOTE — Discharge Instructions (Signed)
Sit up for four hours.  Ice pack to wound as needed for swelling. Incision Care An incision is when a surgeon cuts into your body tissues. After surgery, the incision needs to be cared for properly to prevent infection.  HOME CARE INSTRUCTIONS   Take all medicine as directed by your caregiver. Only take over-the-counter or prescription medicines for pain, discomfort, or fever as directed by your caregiver.  Do not remove your bandage (dressing) or get your incision wet until your surgeon gives you permission. In the event that your dressing becomes wet, dirty, or starts to smell, change the dressing and call your surgeon for instructions as soon as possible.  Take showers. Do not take tub baths, swim, or do anything that may soak the wound until it is healed.  Resume your normal diet and activities as directed or allowed.  Avoid lifting any weight until you are instructed otherwise.  Use anti-itch antihistamine medicine as directed by your caregiver. The wound may itch when it is healing. Do not pick or scratch at the wound.  Follow up with your caregiver for stitch (suture) or staple removal as directed.  Drink enough fluids to keep your urine clear or pale yellow. SEEK MEDICAL CARE IF:   You have redness, swelling, or increasing pain in the wound that is not controlled with medicine.  You have drainage, blood, or pus coming from the wound that lasts longer than 1 day.  You develop muscle aches, chills, or a general ill feeling.  You notice a bad smell coming from the wound or dressing.  Your wound edges separate after the sutures, staples, or skin adhesive strips have been removed.  You develop persistent nausea or vomiting. SEEK IMMEDIATE MEDICAL CARE IF:   You have a fever.  You develop a rash.  You develop dizzy episodes or faint while standing.  You have difficulty breathing.  You develop any reaction or side effects to medicine given. MAKE SURE YOU:   Understand  these instructions.  Will watch your condition.  Will get help right away if you are not doing well or get worse. Document Released: 08/18/2004 Document Revised: 04/23/2011 Document Reviewed: 03/25/2013 [employer] [facility] Patient Information 2015 [place], [place]. This information is not intended to replace advice given to you by your health care provider. Make sure you discuss any questions you have with your health care provider.

## 2014-03-23 ENCOUNTER — Encounter (HOSPITAL_COMMUNITY): Payer: Self-pay | Admitting: General Surgery

## 2015-07-27 ENCOUNTER — Other Ambulatory Visit (HOSPITAL_COMMUNITY): Payer: Self-pay | Admitting: Rheumatology

## 2015-07-27 ENCOUNTER — Ambulatory Visit (HOSPITAL_COMMUNITY)
Admission: RE | Admit: 2015-07-27 | Discharge: 2015-07-27 | Disposition: A | Discharge status: Home / Self Care | Payer: BLUE CROSS/BLUE SHIELD | Source: Ambulatory Visit | Attending: Rheumatology | Admitting: Rheumatology

## 2015-07-27 DIAGNOSIS — J849 Interstitial pulmonary disease, unspecified: Secondary | ICD-10-CM | POA: Insufficient documentation

## 2015-07-27 DIAGNOSIS — I700 Atherosclerosis of aorta: Secondary | ICD-10-CM | POA: Insufficient documentation

## 2015-07-27 DIAGNOSIS — Z5189 Encounter for other specified aftercare: Secondary | ICD-10-CM

## 2015-08-03 ENCOUNTER — Institutional Professional Consult (permissible substitution): Payer: BLUE CROSS/BLUE SHIELD | Admitting: Internal Medicine

## 2015-08-17 ENCOUNTER — Ambulatory Visit: Payer: Self-pay | Admitting: General Surgery

## 2015-09-05 ENCOUNTER — Other Ambulatory Visit (HOSPITAL_COMMUNITY): Payer: Self-pay | Admitting: Rheumatology

## 2015-09-05 ENCOUNTER — Ambulatory Visit (HOSPITAL_COMMUNITY)
Admission: RE | Admit: 2015-09-05 | Discharge: 2015-09-05 | Disposition: A | Discharge status: Home / Self Care | Payer: BLUE CROSS/BLUE SHIELD | Source: Ambulatory Visit | Attending: Rheumatology | Admitting: Rheumatology

## 2015-09-05 ENCOUNTER — Encounter (HOSPITAL_COMMUNITY): Payer: Self-pay | Admitting: *Deleted

## 2015-09-05 DIAGNOSIS — I2510 Atherosclerotic heart disease of native coronary artery without angina pectoris: Secondary | ICD-10-CM | POA: Insufficient documentation

## 2015-09-05 DIAGNOSIS — Z859 Personal history of malignant neoplasm, unspecified: Secondary | ICD-10-CM

## 2015-09-05 DIAGNOSIS — Z809 Family history of malignant neoplasm, unspecified: Secondary | ICD-10-CM | POA: Insufficient documentation

## 2015-09-05 DIAGNOSIS — I700 Atherosclerosis of aorta: Secondary | ICD-10-CM | POA: Insufficient documentation

## 2015-09-05 DIAGNOSIS — D3502 Benign neoplasm of left adrenal gland: Secondary | ICD-10-CM | POA: Insufficient documentation

## 2015-09-05 DIAGNOSIS — J479 Bronchiectasis, uncomplicated: Secondary | ICD-10-CM | POA: Diagnosis not present

## 2015-09-05 MED ORDER — DIATRIZOATE MEGLUMINE & SODIUM 66-10 % PO SOLN
ORAL | Status: AC
  Filled 2015-09-05: qty 30

## 2015-09-05 MED ORDER — IOPAMIDOL (ISOVUE-300) INJECTION 61%
100.0000 mL | Freq: Once | INTRAVENOUS | Status: AC | PRN
  Administered 2015-09-05: 100 mL via INTRAVENOUS

## 2015-09-06 ENCOUNTER — Other Ambulatory Visit: Payer: Self-pay

## 2015-09-06 ENCOUNTER — Encounter (HOSPITAL_COMMUNITY)
Admission: RE | Admit: 2015-09-06 | Discharge: 2015-09-06 | Disposition: A | Discharge status: Home / Self Care | Payer: BLUE CROSS/BLUE SHIELD | Source: Ambulatory Visit | Attending: General Surgery | Admitting: General Surgery

## 2015-09-06 ENCOUNTER — Other Ambulatory Visit (HOSPITAL_COMMUNITY): Payer: Self-pay | Admitting: *Deleted

## 2015-09-06 ENCOUNTER — Encounter (HOSPITAL_COMMUNITY): Payer: Self-pay

## 2015-09-06 DIAGNOSIS — M069 Rheumatoid arthritis, unspecified: Secondary | ICD-10-CM | POA: Diagnosis not present

## 2015-09-06 DIAGNOSIS — Z853 Personal history of malignant neoplasm of breast: Secondary | ICD-10-CM | POA: Diagnosis not present

## 2015-09-06 DIAGNOSIS — F17200 Nicotine dependence, unspecified, uncomplicated: Secondary | ICD-10-CM | POA: Diagnosis not present

## 2015-09-06 DIAGNOSIS — M064 Inflammatory polyarthropathy: Secondary | ICD-10-CM | POA: Diagnosis present

## 2015-09-06 HISTORY — DX: Cough: R05

## 2015-09-06 HISTORY — DX: Reserved for inherently not codable concepts without codable children: IMO0001

## 2015-09-06 HISTORY — DX: Cough, unspecified: R05.9

## 2015-09-06 LAB — BASIC METABOLIC PANEL
Anion gap: 9 (ref 5–15)
BUN: 13 mg/dL (ref 6–20)
CHLORIDE: 101 mmol/L (ref 101–111)
CO2: 26 mmol/L (ref 22–32)
Calcium: 9.4 mg/dL (ref 8.9–10.3)
Creatinine, Ser: 0.82 mg/dL (ref 0.44–1.00)
GFR calc Af Amer: >60 mL/min (ref >60)
GFR calc non Af Amer: >60 mL/min (ref >60)
GLUCOSE: 96 mg/dL (ref 65–99)
POTASSIUM: 3.4 mmol/L — AB (ref 3.5–5.1)
Sodium: 136 mmol/L (ref 135–145)

## 2015-09-06 LAB — CBC
HEMATOCRIT: 45.7 % (ref 36.0–46.0)
Hemoglobin: 15.3 g/dL — ABNORMAL HIGH (ref 12.0–15.0)
MCH: 32.7 pg (ref 26.0–34.0)
MCHC: 33.5 g/dL (ref 30.0–36.0)
MCV: 97.6 fL (ref 78.0–100.0)
Platelets: 255 10*3/uL (ref 150–400)
RBC: 4.68 MIL/uL (ref 3.87–5.11)
RDW: 14 % (ref 11.5–15.5)
WBC: 9.9 10*3/uL (ref 4.0–10.5)

## 2015-09-06 NOTE — Progress Notes (Signed)
Pt denies cardiologist or cardiac studies.   PCP Dr Carolin Sicks last seen 1 month ago at which time he referred her to Dr Jonette Eva for work up due to muscle pain. Pt states her muscle enzymes are elevated, thus the muscle biopsy. Last OV note requested from PCP  Pt has a "smokers cough" and which has gotten worse since started on Prednisone.  Luanna Salk called in to assess pt and will follow up as needed.  EKG today.

## 2015-09-06 NOTE — Pre-Procedure Instructions (Signed)
    Danielle [name]  09/06/2015      [facility] [place], [place] - [id number] [place] [address] Phone: [phone] Fax: [phone]  [place], [place] [address] Phone: [phone] Fax: [phone]    Your procedure is scheduled on Thursday July 27   Report to [facility] Admitting at 5:30 am  Call this number if you have problems the morning of surgery:  [phone]   Remember:  Do not eat food or drink liquids after midnight.   Take these medicines the morning of surgery with A SIP OF WATER:zyrtec if needed, prednisone Do not take any aspirin containing products or Nsaids (i.e. Motrin, ibuprofen, aleve etc)   Do not wear jewelry, make-up or nail polish.  Do not wear lotions, powders, or perfumes.  You may not wear deoderant.  Do not shave 48 hours prior to surgery.    Do not bring valuables to the hospital.  [facility] is not responsible for any belongings or valuables.  Contacts, dentures or bridgework may not be worn into surgery.    Patients discharged the day of surgery will not be allowed to drive home.    Special instructions: Shower with [facility] tonight and in the morning before arriving as instructed  Please read over the following fact sheets that you were given. Shower instructions

## 2015-09-06 NOTE — Progress Notes (Signed)
Anesthesia PAT Evaluation: Patient is a 60 year old female scheduled for right thigh muscle biopsy on 09/08/15 by Dr. Georganna Skeans. Dx: Polyarthritis. She reports symptoms began around 04/2015. PCP Dr. Gerarda Fraction referred her to rheumatologist Dr. Estanislado Pandy. She was started on prednisone. She is unsure of what has and hasn't been ruled out, but muscle biopsy recommended in hopes of determining a definitive diagnosis. Dr. Estanislado Pandy also ordered a chest CT which was done yesterday.  Other history includes smoking (1/2 ppd X 30 years), borderline HLD, HTN, stage II breast cancer '13 s/p right partial mastectomy and chemoradiation, fatty liver,  Patient stays active as a store clerk. She is able to go up a flight of stairs, but slowly due to her leg weakness. She is able to vacuum. She denied chest pain, SOB, known CAD/MI/CHF history. She has chronic mild ankle edema. She reports intermittent cough. She does not use home oxygen. She does not see a pulmonologist or cardiologist. (She has not yet heard about the final reports from her chest CT, but was told she may have developed "scarring" following breast radiation.)  PAT Vitals: 156/76, HR 93, O2 95%. Heart RRR, no murmur noted. Lungs sounds clear without wheezes or crackles. She has 1+ ankle edema bilaterally.  09/06/15 EKG: NSR, possible LAE. Rightward axis.  12/25/11 Echo: Study Conclusions - Left ventricle: The cavity size was normal. Wall thickness was increased increased in a pattern of mild to moderate LVH. The estimated ejection fraction was 60%. Wall motion was normal; there were no regional wall motion abnormalities. Doppler parameters are consistent with abnormal left ventricular relaxation (grade 1 diastolic dysfunction). - Aorta: Mild dilitation of the aortic root. - Left atrium: The atrium was moderately dilated. - Pulmonary arteries: PA peak pressure: 35mm Hg (S).  09/05/15 CT chest (high resolution)/abd/pelvis: IMPRESSION  (ABD/PELVIS): 1. No acute findings in the abdomen or pelvis. No evidence of metastatic/neoplastic disease. 2. Reticular opacities in the lung bases with a pattern consistent with interstitial fibrosis, developing since the prior CT. Please see the high-resolution chest CT for further description and Analysis. IMPRESSION (CHEST): 1. New pattern of basilar predominant subpleural reticulation and ground-glass with traction bronchiectasis/bronchiolectasis and mild architectural distortion. Findings are consistent with fibrotic interstitial lung disease such as nonspecific interstitial pneumonitis or usual interstitial pneumonitis. 2. Aortic atherosclerosis and three-vessel coronary artery calcification. 3. Left adrenal adenoma.   07/27/15 CXR: IMPRESSION: Interstitial lung disease at the bases.  Aortic atherosclerosis.  Preoperative labs noted.   Discussed above with anesthesiologist Dr. Veatrice Kells. Patient denied CV symptoms, reports reasonable exercise tolerance, is not on home oxygen, lungs clear today, and without significant T wave abnormality on EKG. She is scheduled for a muscle biopsy which can hopefully be done using LMA. If no acute changes then it is anticipated that she can proceed as planned. Patient to follow-up with Dr. Estanislado Pandy regarding chest CT results but I did advise that she may need pulmonology referral in the future.   [facility]/Anesthesiology Phone [phone] 09/06/2015 4:29 PM

## 2015-09-07 MED ORDER — CIPROFLOXACIN IN D5W 400 MG/200ML IV SOLN
400.0000 mg | INTRAVENOUS | Status: AC
  Administered 2015-09-08: 400 mg via INTRAVENOUS
  Filled 2015-09-07: qty 200

## 2015-09-07 NOTE — H&P (Signed)
Danielle [name]. Perritt 08/17/2015 11:04 AM Location: [facility] Patient #: [id number] DOB: Oct 27, 1955 Married / Language: English / Race: White Female   History of Present Illness Danielle Neri E. Grandville Silos MD; 08/17/2015 11:14 AM) The patient is a 60 year old female who presents with a complaint of Muscle pain. Choice was recently diagnosed with significant severe inflammatory polyarthritis. She is being treated by Dr. Bo Merino for possible rheumatoid arthritis. She has undergone a very thorough workup. Dr. Estanislado Pandy asked me to see her in consultation for consideration of muscle biopsy. Efrata's had significant joint pain and swelling for several weeks. Additionally, she often gets pain in her muscles. Her upper extremity musculature was bothering her earlier today but that has resolved. She denies any thigh or calf muscular pain right now. She does have significant pain in all of her joints.   Other Problems Davy Pique Bynum, CMA; 08/17/2015 11:05 AM) Arthritis Back Pain Breast Cancer High blood pressure  Past Surgical History Davy Pique Bynum, CMA; 08/17/2015 11:05 AM) Breast Biopsy Right. Breast Mass; Local Excision Right.  Diagnostic Studies History Marjean Donna, CMA; 08/17/2015 11:05 AM) Colonoscopy never Mammogram within last year Pap Smear 1-5 years ago  Allergies Marjean Donna, CMA; 08/17/2015 11:07 AM) Sulfa Antibiotics Keflex *CEPHALOSPORINS*  Medication History (Sonya Bynum, CMA; 08/17/2015 11:06 AM) Lisinopril-Hydrochlorothiazide (20-25MG  Tablet, Oral) Active. Meloxicam (7.5MG  Tablet, Oral) Active. Folic Acid (1MG  Tablet, Oral) Active. Anastrozole (1MG  Tablet, Oral) Active. Advil (100MG  Tablet Chewable, Oral) Active. Medications Reconciled  Social History Marjean Donna, CMA; 08/17/2015 11:05 AM) Alcohol use Occasional alcohol use. Caffeine use Coffee. No drug use  Family History Marjean Donna, CMA; 08/17/2015 11:05 AM) Heart Disease Father.  Pregnancy /  Birth History Marjean Donna, [place]; 08/17/2015 11:05 AM) Age at menarche 16 years. Age of menopause <44 Gravida 2 Maternal age 16-20 Para 2    Review of Systems ([place]; 08/17/2015 11:05 AM) General Present- Fatigue and Weight Gain. Not Present- Appetite Loss, Chills, Fever, Night Sweats and Weight Loss. Skin Not Present- Change in Wart/Mole, Dryness, Hives, Jaundice, New Lesions, Non-Healing Wounds, Rash and Ulcer. HEENT Present- Wears glasses/contact lenses. Not Present- Earache, Hearing Loss, Hoarseness, Nose Bleed, Oral Ulcers, Ringing in the Ears, Seasonal Allergies, Sinus Pain, Sore Throat, Visual Disturbances and Yellow Eyes. Respiratory Present- Wheezing. Not Present- Bloody sputum, Chronic Cough, Difficulty Breathing and Snoring. Endocrine Not Present- Cold Intolerance, Excessive Hunger, Hair Changes, Heat Intolerance, Hot flashes and New Diabetes.  Vitals (Sonya Bynum CMA; 08/17/2015 11:06 AM) 08/17/2015 11:05 AM Weight: 179 lb Height: 63in Body Surface Area: 1.84 m Body Mass Index: 31.71 kg/m  Temp.: 45F(Temporal)  Pulse: 81 (Regular)  BP: 130/72 (Sitting, Left Arm, Standard)       Physical Exam Danielle Neri E. Grandville Silos MD; 08/17/2015 11:15 AM) General Note: no distress   Head and Neck Note: Normocephalic, neck is supple   Eye Note: Wears glasses, sclera clear, pupils equal   ENMT Note: Ears normal externally, oral mucosa is pink and moist   Chest and Lung Exam Note: Lungs are clear to auscultation bilaterally, no wheezing, no rales   Cardiovascular Note: Regular rate and rhythm, no murmur   Abdomen Note: Soft, nontender, nondistended, bowel sounds are normal   Neuropsychiatric Note: Normal mood and affect   Musculoskeletal Note: Tenderness with nodularity and bilateral wrist joints, no pain on palpation of the upper or lower extremity musculature     Assessment & Plan Danielle Neri E. Grandville Silos MD; 08/17/2015 11:16  AM) INFLAMMATORY POLYARTHRITIS (M06.4) Impression: I have offered muscle biopsy  from the right thigh. Procedure, risks, and benefits were discussed in detail with her. We will plan to do this as an outpatient procedure. She is agreeable.  Re-exam 09/08/15 - no changes.  Georganna Skeans, MD, MPH, FACS Trauma: [phone] General Surgery: [phone]

## 2015-09-08 ENCOUNTER — Encounter (HOSPITAL_COMMUNITY): Payer: Self-pay | Admitting: Surgery

## 2015-09-08 ENCOUNTER — Ambulatory Visit (HOSPITAL_COMMUNITY): Payer: BLUE CROSS/BLUE SHIELD | Admitting: Anesthesiology

## 2015-09-08 ENCOUNTER — Ambulatory Visit (HOSPITAL_COMMUNITY)
Admission: RE | Admit: 2015-09-08 | Discharge: 2015-09-08 | Disposition: A | Discharge status: Home / Self Care | Payer: BLUE CROSS/BLUE SHIELD | Source: Ambulatory Visit | Attending: General Surgery | Admitting: General Surgery

## 2015-09-08 ENCOUNTER — Encounter (HOSPITAL_COMMUNITY)
Admission: RE | Disposition: A | Discharge status: Home / Self Care | Payer: Self-pay | Source: Ambulatory Visit | Attending: General Surgery

## 2015-09-08 ENCOUNTER — Ambulatory Visit (HOSPITAL_COMMUNITY): Payer: BLUE CROSS/BLUE SHIELD | Admitting: Vascular Surgery

## 2015-09-08 DIAGNOSIS — F17200 Nicotine dependence, unspecified, uncomplicated: Secondary | ICD-10-CM | POA: Insufficient documentation

## 2015-09-08 DIAGNOSIS — M069 Rheumatoid arthritis, unspecified: Secondary | ICD-10-CM | POA: Insufficient documentation

## 2015-09-08 DIAGNOSIS — Z853 Personal history of malignant neoplasm of breast: Secondary | ICD-10-CM | POA: Insufficient documentation

## 2015-09-08 DIAGNOSIS — M064 Inflammatory polyarthropathy: Secondary | ICD-10-CM | POA: Diagnosis not present

## 2015-09-08 HISTORY — PX: MUSCLE BIOPSY: SHX716

## 2015-09-08 SURGERY — MUSCLE BIOPSY
Anesthesia: General | Site: Thigh | Laterality: Right

## 2015-09-08 MED ORDER — FENTANYL CITRATE (PF) 100 MCG/2ML IJ SOLN
INTRAMUSCULAR | Status: AC
  Filled 2015-09-08: qty 2

## 2015-09-08 MED ORDER — FENTANYL CITRATE (PF) 100 MCG/2ML IJ SOLN
25.0000 ug | INTRAMUSCULAR | Status: DC | PRN
  Administered 2015-09-08: 50 ug via INTRAVENOUS

## 2015-09-08 MED ORDER — MIDAZOLAM HCL 2 MG/2ML IJ SOLN
INTRAMUSCULAR | Status: AC
  Filled 2015-09-08: qty 2

## 2015-09-08 MED ORDER — LIDOCAINE HCL (PF) 1 % IJ SOLN
INTRAMUSCULAR | Status: AC
  Filled 2015-09-08: qty 30

## 2015-09-08 MED ORDER — LIDOCAINE 2% (20 MG/ML) 5 ML SYRINGE
INTRAMUSCULAR | Status: AC
  Filled 2015-09-08: qty 5

## 2015-09-08 MED ORDER — 0.9 % SODIUM CHLORIDE (POUR BTL) OPTIME
TOPICAL | Status: DC | PRN
  Administered 2015-09-08: 1000 mL

## 2015-09-08 MED ORDER — PROPOFOL 10 MG/ML IV BOLUS
INTRAVENOUS | Status: AC
  Filled 2015-09-08: qty 40

## 2015-09-08 MED ORDER — LIDOCAINE HCL (PF) 1 % IJ SOLN
INTRAMUSCULAR | Status: DC | PRN
  Administered 2015-09-08: 10 mL

## 2015-09-08 MED ORDER — CHLORHEXIDINE GLUCONATE CLOTH 2 % EX PADS: 6.0000 | MEDICATED_PAD | Freq: Once | CUTANEOUS | Status: DC

## 2015-09-08 MED ORDER — PROPOFOL 10 MG/ML IV BOLUS
INTRAVENOUS | Status: DC | PRN
  Administered 2015-09-08: 120 mg via INTRAVENOUS

## 2015-09-08 MED ORDER — ONDANSETRON HCL 4 MG/2ML IJ SOLN
INTRAMUSCULAR | Status: AC
  Filled 2015-09-08: qty 2

## 2015-09-08 MED ORDER — LACTATED RINGERS IV SOLN
INTRAVENOUS | Status: DC | PRN
  Administered 2015-09-08: 07:00:00 via INTRAVENOUS

## 2015-09-08 MED ORDER — PHENYLEPHRINE HCL 10 MG/ML IJ SOLN
INTRAMUSCULAR | Status: DC | PRN
  Administered 2015-09-08 (×2): 40 ug via INTRAVENOUS
  Administered 2015-09-08 (×2): 80 ug via INTRAVENOUS

## 2015-09-08 MED ORDER — ONDANSETRON HCL 4 MG/2ML IJ SOLN
INTRAMUSCULAR | Status: DC | PRN
  Administered 2015-09-08: 4 mg via INTRAVENOUS

## 2015-09-08 MED ORDER — MIDAZOLAM HCL 5 MG/5ML IJ SOLN
INTRAMUSCULAR | Status: DC | PRN
  Administered 2015-09-08 (×2): 0.5 mg via INTRAVENOUS

## 2015-09-08 MED ORDER — FENTANYL CITRATE (PF) 250 MCG/5ML IJ SOLN
INTRAMUSCULAR | Status: AC
  Filled 2015-09-08: qty 5

## 2015-09-08 MED ORDER — FENTANYL CITRATE (PF) 100 MCG/2ML IJ SOLN
INTRAMUSCULAR | Status: DC | PRN
  Administered 2015-09-08: 75 ug via INTRAVENOUS
  Administered 2015-09-08: 25 ug via INTRAVENOUS

## 2015-09-08 MED ORDER — OXYCODONE HCL 5 MG PO TABS: 5.0000 mg | ORAL_TABLET | ORAL | 0 refills | Status: DC | PRN

## 2015-09-08 MED ORDER — ONDANSETRON HCL 4 MG/2ML IJ SOLN: 4.0000 mg | Freq: Once | INTRAMUSCULAR | Status: DC | PRN

## 2015-09-08 MED ORDER — LIDOCAINE HCL (CARDIAC) 20 MG/ML IV SOLN
INTRAVENOUS | Status: DC | PRN
  Administered 2015-09-08: 20 mg via INTRAVENOUS

## 2015-09-08 MED ORDER — PHENYLEPHRINE 40 MCG/ML (10ML) SYRINGE FOR IV PUSH (FOR BLOOD PRESSURE SUPPORT)
PREFILLED_SYRINGE | INTRAVENOUS | Status: AC
  Filled 2015-09-08: qty 10

## 2015-09-08 SURGICAL SUPPLY — 37 items
CANISTER SUCTION 2500CC (MISCELLANEOUS) IMPLANT
CHLORAPREP W/TINT 10.5 ML (MISCELLANEOUS) ×3 IMPLANT
CONT SPEC STER OR (MISCELLANEOUS) ×3 IMPLANT
COVER SURGICAL LIGHT HANDLE (MISCELLANEOUS) ×3 IMPLANT
DRAPE LAPAROTOMY T 98X78 PEDS (DRAPES) ×3 IMPLANT
DRAPE UTILITY XL STRL (DRAPES) ×6 IMPLANT
DRESSING TELFA 8X3 (GAUZE/BANDAGES/DRESSINGS) ×3 IMPLANT
ELECT CAUTERY BLADE 6.4 (BLADE) ×3 IMPLANT
ELECT REM PT RETURN 9FT ADLT (ELECTROSURGICAL) ×3
ELECTRODE REM PT RTRN 9FT ADLT (ELECTROSURGICAL) ×1 IMPLANT
GAUZE SPONGE 4X4 16PLY XRAY LF (GAUZE/BANDAGES/DRESSINGS) ×3 IMPLANT
GLOVE BIO SURGEON STRL SZ8 (GLOVE) ×3 IMPLANT
GLOVE BIOGEL PI IND STRL 8 (GLOVE) ×1 IMPLANT
GLOVE BIOGEL PI INDICATOR 8 (GLOVE) ×2
GOWN STRL REUS W/ TWL LRG LVL3 (GOWN DISPOSABLE) ×1 IMPLANT
GOWN STRL REUS W/ TWL XL LVL3 (GOWN DISPOSABLE) ×1 IMPLANT
GOWN STRL REUS W/TWL LRG LVL3 (GOWN DISPOSABLE) ×3
GOWN STRL REUS W/TWL XL LVL3 (GOWN DISPOSABLE) ×3
KIT BASIN OR (CUSTOM PROCEDURE TRAY) ×3 IMPLANT
KIT ROOM TURNOVER OR (KITS) ×3 IMPLANT
LIQUID BAND (GAUZE/BANDAGES/DRESSINGS) ×3 IMPLANT
NDL HYPO 25X1 1.5 SAFETY (NEEDLE) ×1 IMPLANT
NEEDLE HYPO 25X1 1.5 SAFETY (NEEDLE) ×3 IMPLANT
NS IRRIG 1000ML POUR BTL (IV SOLUTION) ×3 IMPLANT
PACK SURGICAL SETUP 50X90 (CUSTOM PROCEDURE TRAY) ×3 IMPLANT
PAD ARMBOARD 7.5X6 YLW CONV (MISCELLANEOUS) ×6 IMPLANT
PENCIL BUTTON HOLSTER BLD 10FT (ELECTRODE) ×3 IMPLANT
SUT MON AB 4-0 PC3 18 (SUTURE) ×3 IMPLANT
SUT VIC AB 3-0 SH 18 (SUTURE) ×3 IMPLANT
SYR BULB 3OZ (MISCELLANEOUS) ×3 IMPLANT
SYR CONTROL 10ML LL (SYRINGE) ×3 IMPLANT
TOWEL OR 17X24 6PK STRL BLUE (TOWEL DISPOSABLE) ×3 IMPLANT
TOWEL OR 17X26 10 PK STRL BLUE (TOWEL DISPOSABLE) ×3 IMPLANT
TUBE CONNECTING 12'X1/4 (SUCTIONS) ×1
TUBE CONNECTING 12X1/4 (SUCTIONS) ×1 IMPLANT
WATER STERILE IRR 1000ML POUR (IV SOLUTION) ×2 IMPLANT
YANKAUER SUCT BULB TIP NO VENT (SUCTIONS) ×2 IMPLANT

## 2015-09-08 NOTE — Anesthesia Postprocedure Evaluation (Signed)
Anesthesia Post Note  Patient: Danielle [name]  Procedure(s) Performed: Procedure(s) (LRB): RIGHT THIGH MUSCLE BIOPSY (Right)  Patient location during evaluation: PACU Anesthesia Type: General Level of consciousness: awake, awake and alert and oriented Pain management: pain level controlled Vital Signs Assessment: post-procedure vital signs reviewed and stable Respiratory status: spontaneous breathing, nonlabored ventilation and respiratory function stable Cardiovascular status: blood pressure returned to baseline Anesthetic complications: no    Last Vitals:  Vitals:   09/08/15 0900 09/08/15 0916  BP: 115/69 121/81  Pulse: 72   Resp: 16 18  Temp: 36.7 C     Last Pain:  Vitals:   09/08/15 0915  TempSrc:   PainSc: 3                  Delphina Schum COKER

## 2015-09-08 NOTE — Addendum Note (Signed)
Addendum  created 09/08/15 1659 by Terrill Mohr, CRNA   Anesthesia Event deleted, Anesthesia Event edited

## 2015-09-08 NOTE — Interval H&P Note (Signed)
History and Physical Interval Note:  09/08/2015 6:30 AM  Danielle [name]  has presented today for surgery, with the diagnosis of Polyarthritis  The various methods of treatment have been discussed with the patient and family. After consideration of risks, benefits and other options for treatment, the patient has consented to  Procedure(s): RIGHT THIGH MUSCLE BIOPSY (Right) as a surgical intervention .  The patient's history has been reviewed, patient examined, site marked, no change in status, stable for surgery.  I have reviewed the patient's chart and labs.  Questions were answered to the patient's satisfaction.     Mandeep Kiser E

## 2015-09-08 NOTE — Op Note (Signed)
09/08/2015  8:03 AM  PATIENT:  Danielle [name]  60 y.o. female  PRE-OPERATIVE DIAGNOSIS:  Inflammatory Polyarthritis  POST-OPERATIVE DIAGNOSIS:  Inflammatory Polyarthritis  PROCEDURE:  Procedure(s): RIGHT THIGH MUSCLE BIOPSY  SURGEON:  Surgeon(s): Georganna Skeans, MD  ASSISTANTS: none   ANESTHESIA:   local and general  EBL:  No intake/output data recorded.  BLOOD ADMINISTERED:none  DRAINS: none   SPECIMEN:  Excision  DISPOSITION OF SPECIMEN:  PATHOLOGY  COUNTS:  YES  DICTATION: .Dragon Dictation Ms. Helke presents for right thigh muscle biopsy. She was identified in the preop holding area. Her site was marked. Informed consent was obtained. She received intravenous antibiotics which was brought to the operating room and general anesthesia with laryngeal mask airway was administered by the anesthesia staff. Her right thigh was prepped and draped in a sterile fashion. Time out procedure was performed. Local anesthetic was injected. Vertical incision was made over her anterolateral thigh. Subcutaneous tissues were dissected down revealing the fascia. This was sharply opened revealing the underlying muscle. A piece of muscle tissue approximately 1.5 x 2.5 cm with sharply dissected and removed. This was sent to pathology according to their instructions. Hemostasis was obtained with cautery. Fascia was closed with running 3-0 Vicryl. Subcutaneous tissues were irrigated and the skin was closed with running 4-0 Monocryl subcuticular suture followed by liquid band. All counts were correct. She tolerated the procedure well without apparent complication was taken recovery in stable condition. PATIENT DISPOSITION:  PACU - hemodynamically stable.   Delay start of Pharmacological VTE agent (>24hrs) due to surgical blood loss or risk of bleeding:  no  Georganna Skeans, MD, MPH, FACS Pager: [phone]  7/27/20178:03 AM

## 2015-09-08 NOTE — Anesthesia Procedure Notes (Signed)
Procedure Name: LMA Insertion Date/Time: 09/08/2015 7:34 AM Performed by: Williemae Area B Pre-anesthesia Checklist: Patient identified, Emergency Drugs available, Suction available and Patient being monitored Patient Re-evaluated:Patient Re-evaluated prior to inductionOxygen Delivery Method: Circle system utilized Preoxygenation: Pre-oxygenation with 100% oxygen Intubation Type: IV induction Ventilation: Mask ventilation without difficulty LMA: LMA inserted LMA Size: 4.0 Number of attempts: 1 Tube secured with: Tape (taped across cheeks) Dental Injury: Teeth and Oropharynx as per pre-operative assessment

## 2015-09-08 NOTE — Anesthesia Preprocedure Evaluation (Addendum)
Anesthesia Evaluation  Patient identified by MRN, date of birth, ID band Patient awake    Reviewed: Allergy & Precautions, NPO status , Patient's Chart, lab work & pertinent test results  Airway Mallampati: II  TM Distance: >3 FB Neck ROM: Full    Dental  (+) Edentulous Upper, Edentulous Lower   Pulmonary Current Smoker,  Pt has a loose cough the last few weeks; sees a pulmonary doc, says radiation hurt lungs...   breath sounds clear to auscultation       Cardiovascular  Rhythm:Regular Rate:Normal     Neuro/Psych    GI/Hepatic   Endo/Other  On daily prednisone for the last month, for RA  Renal/GU      Musculoskeletal   Abdominal   Peds  Hematology   Anesthesia Other Findings   Reproductive/Obstetrics                           Anesthesia Physical Anesthesia Plan  ASA: III  Anesthesia Plan: General   Post-op Pain Management:    Induction: Intravenous  Airway Management Planned: LMA  Additional Equipment:   Intra-op Plan:   Post-operative Plan:   Informed Consent: I have reviewed the patients History and Physical, chart, labs and discussed the procedure including the risks, benefits and alternatives for the proposed anesthesia with the patient or authorized representative who has indicated his/her understanding and acceptance.     Plan Discussed with:   Anesthesia Plan Comments:         Anesthesia Quick Evaluation

## 2015-09-08 NOTE — Transfer of Care (Signed)
Immediate Anesthesia Transfer of Care Note  Patient: Danielle [name]  Procedure(s) Performed: Procedure(s): RIGHT THIGH MUSCLE BIOPSY (Right)  Patient Location: PACU  Anesthesia Type:General  Level of Consciousness: awake, alert , oriented and patient cooperative  Airway & Oxygen Therapy: Patient Spontanous Breathing and Patient connected to nasal cannula oxygen  Post-op Assessment: Report given to RN, Post -op Vital signs reviewed and stable and Patient moving all extremities  Post vital signs: Reviewed and stable  Last Vitals:  Vitals:   09/08/15 0634  BP: (!) 151/71  Pulse: 80  Resp: (!) 0  Temp: 37 C    Last Pain:  Vitals:   09/08/15 0647  TempSrc:   PainSc: 5          Complications: No apparent anesthesia complications

## 2015-09-09 ENCOUNTER — Encounter (HOSPITAL_COMMUNITY): Payer: Self-pay | Admitting: General Surgery

## 2015-09-20 ENCOUNTER — Encounter (HOSPITAL_COMMUNITY): Payer: Self-pay

## 2015-10-03 ENCOUNTER — Encounter: Payer: Self-pay | Admitting: Internal Medicine

## 2015-10-03 ENCOUNTER — Ambulatory Visit (INDEPENDENT_AMBULATORY_CARE_PROVIDER_SITE_OTHER): Payer: BLUE CROSS/BLUE SHIELD | Admitting: Internal Medicine

## 2015-10-03 VITALS — BP 132/84 | HR 91 | Ht 65.5 in | Wt 174.0 lb

## 2015-10-03 DIAGNOSIS — M1990 Unspecified osteoarthritis, unspecified site: Secondary | ICD-10-CM

## 2015-10-03 DIAGNOSIS — J849 Interstitial pulmonary disease, unspecified: Secondary | ICD-10-CM

## 2015-10-03 DIAGNOSIS — Z79811 Long term (current) use of aromatase inhibitors: Secondary | ICD-10-CM

## 2015-10-03 DIAGNOSIS — F17210 Nicotine dependence, cigarettes, uncomplicated: Secondary | ICD-10-CM

## 2015-10-03 DIAGNOSIS — R05 Cough: Secondary | ICD-10-CM

## 2015-10-03 DIAGNOSIS — R748 Abnormal levels of other serum enzymes: Secondary | ICD-10-CM | POA: Diagnosis not present

## 2015-10-03 DIAGNOSIS — Z79899 Other long term (current) drug therapy: Secondary | ICD-10-CM

## 2015-10-03 DIAGNOSIS — R053 Chronic cough: Secondary | ICD-10-CM

## 2015-10-03 MED ORDER — LOSARTAN POTASSIUM 25 MG PO TABS: 25.0000 mg | ORAL_TABLET | Freq: Every day | ORAL | 5 refills | Status: DC

## 2015-10-03 NOTE — Addendum Note (Signed)
Addended by: Collier Salina on: 10/03/2015 04:27 PM   Modules accepted: Orders

## 2015-10-03 NOTE — Progress Notes (Signed)
Subjective:    Patient ID: Danielle [name], female    DOB: [birth year], 60 y.o.   MRN: [id number]  PCP Glo Herring., MD   HPI  OV 10/03/2015  Chief Complaint  Patient presents with  . Pulmonary Consult    Pt referred by Dr. Estanislado Pandy for ILD. Pt states she was started on pred for RA and then developed a prod cough with clear mucus. Pt denies CP/tightness and SOB.     60 year old female referred by Dr Bo Merino. Question is about interstitial lung disease in this smoker with a new diagnosis of rheumatoid arthritis  According to the patient she's had low back pain for the last few years. She's noticed difficulty getting up and down from sitting to standing position since March 2017 associated with migratory only arthritis as detailed by herself and confirmed and Dr. Arlean Hopping notes since March 2017. There is early morning stiffness lasting 2 hours. There is nocturnal pain and difficulty writing. However history is negative for oral ulcers, nasal ulcers, malar rash, photosensitivity, sicca symptoms, chest pain, palpitations, psoriasis, conjunctivitis, urethritis or diarrhea. She denies any baseline shortness of breath or cough at the time she saw Dr. Estanislado Pandy for the initial intake on 08/12/2015. At that point in time she was on lisinopril for hypertension but did not have any cough. Past medical history at that point in time was significant for stage II breast cancer in 2015 requiring lumpectomy, chemotherapy and radiation therapy for 6 months in the right side. Past medical history is also significant for smoking. And also drinking 3 alcoholic beverages wine a day. And sedentary lifestyle. At this visit she was started on prednisone but says after starting the prednisone within 2 days started noticing new onset cough that is quality is wet with loose sputum. Mostly at night. Upon lying down. Scale 3 out of 10 in severity. Occasionally associated wheeze. 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 (Of note previous CT chest that I personally visualized in 2013 did not show such findings). She again and the cough is new since going on prednisone.  Significant laboratory values with Dr. Estanislado Pandy include normal sedimentation rate of 05/22/2015, elevated total CK of 1000 664 07/27/2015 but with normal TSH, HIV hepatitis virus panel rheumatoid factor cyclic citrulline peptide, antibodies. The ANA was slightly positive at 1:40. The Quantiferon TB bold negative  She has a diagnosis of rheumatoid arthritis now based on all of the above. Then on 09/08/2015 underwent right upper quadrant muscle biopsy that shows only mild inflammation with myonecrosis. She does not know the results yet.    Walking desaturation test 150 feet 3 laps on room air: She only walk 2 laps and stopped due to back pain. She walks slowly. But did not desaturate.  Exposure history - Denies any more or mildew exposure. Denies having any birds or pigeons her parents. 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   reports that she has been smoking Cigarettes.  She has a 44.00 pack-year smoking history. She has never used smokeless tobacco.  Past Surgical History:  Procedure Laterality Date  . AXILLARY LYMPH NODE DISSECTION  06/04/2011   Procedure: AXILLARY LYMPH NODE DISSECTION;  Surgeon: Donato Heinz, MD;  Location: [facility];  Service: General;  Laterality: Right;  . MASTECTOMY, PARTIAL  06/04/2011   Procedure: MASTECTOMY PARTIAL;  Surgeon: Donato Heinz, MD;  Location: [facility];  Service: General;  Laterality: Right;  . MUSCLE BIOPSY Right 09/08/2015   Procedure: RIGHT THIGH MUSCLE BIOPSY;  Surgeon: Georganna Skeans, MD;  Location: [facility];  Service: General;  Laterality: Right;  . PORT-A-CATH REMOVAL Left 03/22/2014   Procedure: MINOR REMOVAL PORT-A-CATH;  Surgeon: Jamesetta So, MD;  Location: [facility];  Service: General;  Laterality: Left;  . PORTACATH PLACEMENT  07/23/2011   Procedure: INSERTION PORT-A-CATH;  Surgeon: Donato Heinz, MD;  Location: [facility];  Service: General;  Laterality: Left;  Left Subclavian  . SKIN BIOPSY  12/19/2011   Procedure: BIOPSY SKIN;  Surgeon: Donato Heinz, MD;  Location: [facility];  Service: General;  Laterality: Right;  In Minor Room  . TUBAL LIGATION  1980    Allergies  Allergen Reactions  . Bactrim [Sulfamethoxazole-Trimethoprim] Nausea Only and Other (See Comments)    Fever and chills along with the nausea.  . Cephalexin Palpitations    Reaction:Heart flutter    Immunization History  Administered Date(s) Administered  . Pneumococcal-Unspecified 07/14/2015  . Zoster 07/14/2015    Family History  Problem Relation Age of Onset  . Heart disease Mother   . Diabetes Mother   . Heart disease Father   . Diabetes Father   . Cancer Sister     breast cancer  . Anesthesia problems Neg Hx   . Malignant hyperthermia Neg Hx   . Pseudochol deficiency Neg Hx      Current Outpatient Prescriptions:  .  lisinopril-hydrochlorothiazide (PRINZIDE,ZESTORETIC) 20-25 MG per tablet, Take 2 tablets by mouth daily., Disp: , Rfl:  .  oxyCODONE (OXY IR/ROXICODONE) 5 MG immediate release tablet, Take 1 tablet (5 mg total) by mouth every 4 (four) hours as needed for moderate pain or severe pain., Disp: 25 tablet, Rfl: 0 .  predniSONE (DELTASONE) 5 MG tablet, Take 15 mg by mouth daily., Disp: , Rfl: 0 No current facility-administered medications for this visit.   Facility-Administered Medications Ordered in Other  Visits:  .  0.9 %  sodium chloride infusion, , Intravenous, Continuous, Everardo All, MD, Last Rate: 500 mL/hr at 08/17/11 1144, 1,000 mL at 08/17/11 1144     Review of Systems  Constitutional: Negative for fever and unexpected weight change.  HENT: Negative for congestion, dental problem, ear pain, nosebleeds, postnasal drip, rhinorrhea, sinus pressure, sneezing, sore throat and trouble swallowing.   Eyes: Negative for redness and itching.  Respiratory: Positive for cough. Negative for chest tightness, shortness of breath and wheezing.   Cardiovascular: Positive for leg swelling. Negative for palpitations.  Gastrointestinal: Negative for nausea and vomiting.  Genitourinary: Negative for dysuria.  Musculoskeletal: Positive for joint swelling.  Skin: Negative for rash.  Neurological: Negative for headaches.  Hematological: Does not bruise/bleed easily.  Psychiatric/Behavioral: Negative for dysphoric mood. The patient is not nervous/anxious.        Objective:   Physical Exam  Constitutional: She is oriented to person, place, and time. She appears well-developed and well-nourished. No distress.  Body mass index is 28.51 kg/m.   HENT:  Head: Normocephalic and atraumatic.  Right Ear: External ear normal.  Left Ear: External ear normal.  Mouth/Throat: Oropharynx is clear and moist. No oropharyngeal exudate.  Eyes: Conjunctivae and EOM are normal.  Pupils are equal, round, and reactive to light. Right eye exhibits no discharge. Left eye exhibits no discharge. No scleral icterus.  Neck: Normal range of motion. Neck supple. No JVD present. No tracheal deviation present. No thyromegaly present.  Cardiovascular: Normal rate, regular rhythm, normal heart sounds and intact distal pulses.  Exam reveals no gallop and no friction rub.   No murmur heard. Pulmonary/Chest: Effort normal and breath sounds normal. No respiratory distress. She has no wheezes. She has no rales. She exhibits no  tenderness.  Crackles in base  Abdominal: Soft. Bowel sounds are normal. She exhibits no distension and no mass. There is no tenderness. There is no rebound and no guarding.  Musculoskeletal: Normal range of motion. She exhibits no edema or tenderness.  Had significant difficulty getting out of the chair Reports proximal muscle weakness Had to stop walking a lot of 3 laps in the office due to back pain Antalgic gait  Lymphadenopathy:    She has no cervical adenopathy.  Neurological: She is alert and oriented to person, place, and time. She has normal reflexes. No cranial nerve deficit. She exhibits normal muscle tone. Coordination normal.  Skin: Skin is warm and dry. No rash noted. She is not diaphoretic. No erythema. No pallor.  Psychiatric: She has a normal mood and affect. Her behavior is normal. Judgment and thought content normal.  Vitals reviewed.   Vitals:   10/03/15 1534  BP: 132/84  Pulse: 91  SpO2: 96%  Weight: 174 lb (78.9 kg)  Height: 5' 5.5" (1.664 m)         Assessment & Plan:     ICD-9-CM ICD-10-CM   1. ILD (interstitial lung disease) ([place]) 515 J84.9   2. Chronic cough 786.2 R05   3. Smoking greater than 40 pack years 305.1 F17.210   4. Elevated CK 790.5 R74.8   5. Inflammatory arthritis ([place]) 714.9 M19.90   6. On angiotensin-converting enzyme (ACE) inhibitors V07.52 Z79.811     - Symptoms could be related to any of the above - Need to rule out COPD  Plan - Do full pulmonary function test - I will try to touch base with Dr. Estanislado Pandy - Stop lisinopril - Start losartan 25 mg daily - I will obtain follow-up blood test results from last Monday  Follow-up - Return to see me in the next few weeks but after completing breathing test and changing the blood pressure medications. - Depending on your symptoms and breathing test results he might need surgical lung biopsy to figure out if the inflammation in the lung tissues related to arthritis or smoking or  other causes such as using a feathered pillow (hypersensitivity pneumonitis])   Dr. Brand Males, M.D., [facility].C.P Pulmonary and Critical Care Medicine Staff Physician [place] [facility] Pager: [phone], If no answer or between  15:00h - 7:00h: call [phone]  10/03/2015 4:21 PM

## 2015-10-03 NOTE — Patient Instructions (Addendum)
ICD-9-CM ICD-10-CM   1. ILD (interstitial lung disease) ([place]) 515 J84.9   2. Chronic cough 786.2 R05   3. Smoking greater than 40 pack years 305.1 F17.210   4. Elevated CK 790.5 R74.8   5. Inflammatory arthritis ([place]) 714.9 M19.90   6. On angiotensin-converting enzyme (ACE) inhibitors V07.52 Z79.811    - Symptoms could be related to any of the above - Need to rule out COPD  Plan - Do full pulmonary function test - I will try to touch base with Dr. Estanislado Pandy - Stop lisinopril - Start losartan 25 mg daily - I will obtain follow-up blood test results from last Monday  Follow-up - Return to see me in the next few weeks but after completing breathing test and changing the blood pressure medications. - Depending on your symptoms and breathing test results he might need surgical lung biopsy to figure out if the inflammation in the lung tissues related to arthritis or smoking or other causes such as using a feathered pillow

## 2015-10-12 ENCOUNTER — Ambulatory Visit (HOSPITAL_COMMUNITY)
Admission: RE | Admit: 2015-10-12 | Discharge: 2015-10-12 | Disposition: A | Discharge status: Home / Self Care | Payer: BLUE CROSS/BLUE SHIELD | Source: Ambulatory Visit | Attending: Internal Medicine | Admitting: Internal Medicine

## 2015-10-12 ENCOUNTER — Telehealth: Payer: Self-pay | Admitting: Internal Medicine

## 2015-10-12 DIAGNOSIS — J849 Interstitial pulmonary disease, unspecified: Secondary | ICD-10-CM

## 2015-10-12 MED ORDER — ALBUTEROL SULFATE (2.5 MG/3ML) 0.083% IN NEBU: 2.5000 mg | INHALATION_SOLUTION | Freq: Once | RESPIRATORY_TRACT | Status: DC

## 2015-10-12 NOTE — Telephone Encounter (Signed)
Received call from Andre Lefort at [facility] PFT's. Arbie Cookey states she took pt's vitals prior to pt's PFT and pt's BP was elevated at 184/101, HR 99, and spO2 is 98% on RA. Spoke with pt and she states she feels dizzy and feels her heart is fluttering. Spoke with Arbie Cookey, PFT tech, and she states she took a radial pulse and her heart rate feels regular, pt is alert and oriented. Pt also c/o headache and neck pain. Pt denies increase in SOB and CP/tightness and also states her cough is unchanged since she was seen on 10/03/15 by MR. When pt was at [facility] on 8/21 her BP med was changed from [place] 20-25, was taking 2 tablets per day, pt was changed to Losartan 25mg  daily. Pt states she started taking the Losartan 25mg  on Sunday 8/27 and stated her pressure started to increase on Monday 8/28. PFT cancelled.   Dr. Chase Caller please advise. Thanks.

## 2015-10-12 NOTE — Progress Notes (Signed)
Respiratory Care Note;  Pt in for PFT study today.  Pt. C/o of being lightheaded and Blood Pressure being elevated since Monday.  Vitals taken 184/101,  RR 18-20 , HR 99, Oxygen saturation 98%.  Called Dr. Golden Pop office and spoke with Daleen Snook.  PFT will be rescheduled.

## 2015-10-12 NOTE — Telephone Encounter (Signed)
Spoke with MR - increase Losartan to 50mg  daily and schedule OV with PCP to further address. Seek emergency care if an increase in s/s or worsening in pressure.   Called and spoke to pt. Informed her of the recs per MR. Pt verbalized understanding and states she will take 2 tabs at a time of the losartan 25mg . Pt states she has an appt with her PCP on 10/18/2015 and will address BP at the visit. Nothing further needed at this time.

## 2015-10-12 NOTE — Telephone Encounter (Signed)
lmomtcb x1 

## 2015-10-26 ENCOUNTER — Ambulatory Visit: Payer: BLUE CROSS/BLUE SHIELD | Admitting: Internal Medicine

## 2015-11-16 DIAGNOSIS — J849 Interstitial pulmonary disease, unspecified: Secondary | ICD-10-CM | POA: Insufficient documentation

## 2015-11-16 DIAGNOSIS — M1990 Unspecified osteoarthritis, unspecified site: Secondary | ICD-10-CM | POA: Insufficient documentation

## 2015-11-22 ENCOUNTER — Encounter (HOSPITAL_COMMUNITY): Payer: Self-pay

## 2015-11-22 ENCOUNTER — Telehealth (HOSPITAL_COMMUNITY): Payer: Self-pay

## 2015-11-22 ENCOUNTER — Ambulatory Visit (HOSPITAL_COMMUNITY): Payer: BLUE CROSS/BLUE SHIELD

## 2015-11-22 NOTE — Telephone Encounter (Signed)
11/22/15 she came in and as we were doing FOTO she said that she didn't know if she should be here.  She said that she saw a Dr. At [facility] and he diagnosed her with myopathy.  She is currently on several medications and she wants to wait and see what happens with that.  I went ahead and checked her out of the appointment.

## 2016-01-26 ENCOUNTER — Other Ambulatory Visit (HOSPITAL_COMMUNITY): Payer: Self-pay | Admitting: Respiratory Therapy

## 2016-01-26 DIAGNOSIS — J849 Interstitial pulmonary disease, unspecified: Secondary | ICD-10-CM

## 2016-02-14 ENCOUNTER — Encounter (HOSPITAL_COMMUNITY): Payer: Self-pay | Admitting: Emergency Medicine

## 2016-02-14 ENCOUNTER — Emergency Department (HOSPITAL_COMMUNITY)
Admission: EM | Admit: 2016-02-14 | Discharge: 2016-02-14 | Disposition: A | Discharge status: Home / Self Care | Payer: BLUE CROSS/BLUE SHIELD | Attending: Emergency Medicine | Admitting: Emergency Medicine

## 2016-02-14 DIAGNOSIS — M4692 Unspecified inflammatory spondylopathy, cervical region: Secondary | ICD-10-CM | POA: Diagnosis not present

## 2016-02-14 DIAGNOSIS — M436 Torticollis: Secondary | ICD-10-CM | POA: Diagnosis not present

## 2016-02-14 DIAGNOSIS — M47812 Spondylosis without myelopathy or radiculopathy, cervical region: Secondary | ICD-10-CM

## 2016-02-14 DIAGNOSIS — Z79899 Other long term (current) drug therapy: Secondary | ICD-10-CM | POA: Insufficient documentation

## 2016-02-14 DIAGNOSIS — Z853 Personal history of malignant neoplasm of breast: Secondary | ICD-10-CM | POA: Diagnosis not present

## 2016-02-14 DIAGNOSIS — F17210 Nicotine dependence, cigarettes, uncomplicated: Secondary | ICD-10-CM | POA: Diagnosis not present

## 2016-02-14 DIAGNOSIS — M542 Cervicalgia: Secondary | ICD-10-CM | POA: Diagnosis present

## 2016-02-14 DIAGNOSIS — I10 Essential (primary) hypertension: Secondary | ICD-10-CM | POA: Insufficient documentation

## 2016-02-14 MED ORDER — ONDANSETRON HCL 4 MG PO TABS
4.0000 mg | ORAL_TABLET | Freq: Once | ORAL | Status: AC
Start: 2016-02-14 — End: 2016-02-14
  Administered 2016-02-14: 4 mg via ORAL
  Filled 2016-02-14: qty 1

## 2016-02-14 MED ORDER — METHOCARBAMOL 500 MG PO TABS: 500.0000 mg | ORAL_TABLET | Freq: Three times a day (TID) | ORAL | 0 refills | Status: DC

## 2016-02-14 MED ORDER — KETOROLAC TROMETHAMINE 10 MG PO TABS
10.0000 mg | ORAL_TABLET | Freq: Once | ORAL | Status: AC
  Administered 2016-02-14: 10 mg via ORAL
  Filled 2016-02-14: qty 1

## 2016-02-14 MED ORDER — MELOXICAM 15 MG PO TABS: 15.0000 mg | ORAL_TABLET | Freq: Every day | ORAL | 0 refills | Status: DC

## 2016-02-14 MED ORDER — HYDROCODONE-ACETAMINOPHEN 5-325 MG PO TABS
2.0000 | ORAL_TABLET | Freq: Once | ORAL | Status: DC
  Filled 2016-02-14: qty 2

## 2016-02-14 MED ORDER — HYDROCODONE-ACETAMINOPHEN 5-325 MG PO TABS: 1.0000 | ORAL_TABLET | ORAL | 0 refills | Status: DC | PRN

## 2016-02-14 MED ORDER — DIAZEPAM 5 MG PO TABS
10.0000 mg | ORAL_TABLET | Freq: Once | ORAL | Status: AC
Start: 2016-02-14 — End: 2016-02-14
  Administered 2016-02-14: 10 mg via ORAL
  Filled 2016-02-14: qty 2

## 2016-02-14 NOTE — ED Provider Notes (Signed)
[facility] DEPT Provider Note   CSN: [id number] Arrival date & time: 02/14/16  1247     History   Chief Complaint Chief Complaint  Patient presents with  . Neck Pain    HPI Danielle [name] is a 61 y.o. female.  The history is provided by the patient.  Neck Pain   This is a new problem. The current episode started 6 to 12 hours ago. The problem occurs constantly. The problem has been gradually worsening. Associated with: waking in bad neck position. There has been no fever. The pain is present in the left side and right side. The quality of the pain is described as aching and cramping. The pain radiates to the left shoulder and right shoulder. The pain is moderate. The symptoms are aggravated by twisting (certain movements). The pain is the same all the time. Pertinent negatives include no photophobia, no visual change, no chest pain, no syncope, no numbness, no bowel incontinence, no bladder incontinence, no tingling and no weakness. She has tried NSAIDs for the symptoms. The treatment provided no relief.    Past Medical History:  Diagnosis Date  . Borderline hyperlipidemia    managed with diet and exercise  . Breast cancer ([place]) 08/17/2011  . Cancer ([place])   . Cough    smokers cough  . Fatty infiltration of liver 05/13/2013  . Gastric ulcer 09/17/2011   not a current issue  . Hypertension   . 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 Port catheter in place 05/13/2012  . Shortness of breath dyspnea     Patient Active Problem List   Diagnosis Date Noted  . Fatty infiltration of liver 05/13/2013  . Stiffness of joint, not elsewhere classified, other specified site 01/27/2013  . Lymphedema of arm 01/27/2013  . Stress fracture of foot 06/04/2012  . 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 Pain in joint, shoulder region 04/15/2012  . Muscle tightness 04/15/2012  . Gastric ulcer  09/17/2011  . 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 Hypertension 08/17/2011    Past Surgical History:  Procedure Laterality Date  . AXILLARY LYMPH NODE DISSECTION  06/04/2011   Procedure: AXILLARY LYMPH NODE DISSECTION;  Surgeon: Donato Heinz, MD;  Location: [facility];  Service: General;  Laterality: Right;  . MASTECTOMY, PARTIAL  06/04/2011   Procedure: MASTECTOMY PARTIAL;  Surgeon: Donato Heinz, MD;  Location: [facility];  Service: General;  Laterality: Right;  . MUSCLE BIOPSY Right 09/08/2015   Procedure: RIGHT THIGH MUSCLE BIOPSY;  Surgeon: Georganna Skeans, MD;  Location: [facility];  Service: General;  Laterality: Right;  . PORT-A-CATH REMOVAL Left 03/22/2014   Procedure: MINOR REMOVAL PORT-A-CATH;  Surgeon: Jamesetta So, MD;  Location: [facility];  Service: General;  Laterality: Left;  . PORTACATH PLACEMENT  07/23/2011   Procedure: INSERTION PORT-A-CATH;  Surgeon: Donato Heinz, MD;  Location: [facility];  Service: General;  Laterality: Left;  Left Subclavian  . SKIN BIOPSY  12/19/2011   Procedure: BIOPSY SKIN;  Surgeon: Donato Heinz, MD;  Location: [facility];  Service: General;  Laterality: Right;  In Minor Room  . TUBAL LIGATION  1980    OB History    No data available       Home Medications    Prior to Admission medications   Medication Sig Start Date End Date Taking? Authorizing Provider  losartan (COZAAR) 25  MG tablet Take 1 tablet (25 mg total) by mouth daily. 10/03/15   Brand Males, MD  oxyCODONE (OXY IR/ROXICODONE) 5 MG immediate release tablet Take 1 tablet (5 mg total) by mouth every 4 (four) hours as needed for moderate pain or severe pain. 09/08/15   Georganna Skeans, MD  predniSONE (DELTASONE) 5 MG tablet Take 15 mg by mouth daily. 08/12/15   Historical Provider, MD    Family History Family History  Problem Relation Age of Onset  . Heart disease Mother   . Diabetes Mother   . Heart disease Father   . Diabetes Father   . Cancer Sister      breast cancer  . Anesthesia problems Neg Hx   . Malignant hyperthermia Neg Hx   . Pseudochol deficiency Neg Hx     Social History Social History  Substance Use Topics  . Smoking status: Current Every Day Smoker    Packs/day: 1.00    Years: 44.00    Types: Cigarettes  . Smokeless tobacco: Never Used     Comment: 0.5ppd 10/03/15  . Alcohol use 16.8 oz/week    28 Glasses of wine per week     Comment: 3-4 glasses of wine a night but has cut back now     Allergies   Bactrim [sulfamethoxazole-trimethoprim] and Cephalexin   Review of Systems Review of Systems  Constitutional: Negative for activity change.       All ROS Neg except as noted in HPI  HENT: Negative for nosebleeds.   Eyes: Negative for photophobia and discharge.  Respiratory: Negative for cough, shortness of breath and wheezing.   Cardiovascular: Negative for chest pain, palpitations and syncope.  Gastrointestinal: Negative for abdominal pain, blood in stool and bowel incontinence.  Genitourinary: Negative for bladder incontinence, dysuria, frequency and hematuria.  Musculoskeletal: Positive for arthralgias, back pain and neck pain.  Skin: Negative.   Neurological: Negative for dizziness, tingling, seizures, speech difficulty, weakness and numbness.  Psychiatric/Behavioral: Negative for confusion and hallucinations.     Physical Exam Updated Vital Signs BP 154/88 (BP Location: Left Arm)   Pulse 110   Temp 97.7 F (36.5 C) (Oral)   Resp 18   Ht 5\' 5"  (1.651 m)   Wt 78.9 kg   SpO2 97%   BMI 28.96 kg/m   Physical Exam  Constitutional: She is oriented to person, place, and time. She appears well-developed and well-nourished.  Non-toxic appearance.  HENT:  Head: Normocephalic.  Right Ear: Tympanic membrane and external ear normal.  Left Ear: Tympanic membrane and external ear normal.  Eyes: EOM and lids are normal. Pupils are equal, round, and reactive to light.  Neck: Normal range of motion. Neck supple.  Carotid bruit is not present.  Cardiovascular: Normal rate, regular rhythm, normal heart sounds, intact distal pulses and normal pulses.   Pulmonary/Chest: Breath sounds normal. No respiratory distress.  Abdominal: Soft. Bowel sounds are normal. There is no tenderness. There is no guarding.  Musculoskeletal: Normal range of motion.  There is tightness and tenseness of the right and left upper trapezius . No step off of the C spine or T spine. Good ROM of right and left shoulder.  Lymphadenopathy:       Head (right side): No submandibular adenopathy present.       Head (left side): No submandibular adenopathy present.    She has no cervical adenopathy.  Neurological: She is alert and oriented to person, place, and time. She has normal strength. No cranial nerve deficit  or sensory deficit. She exhibits normal muscle tone. Coordination normal.  Grip limited due to rheumatoid arthritis, but symmetrical.  Skin: Skin is warm and dry.  Psychiatric: She has a normal mood and affect. Her speech is normal.  Nursing note and vitals reviewed.    ED Treatments / Results  Labs (all labs ordered are listed, but only abnormal results are displayed) Labs Reviewed - No data to display  EKG  EKG Interpretation None       Radiology No results found.  Procedures Procedures (including critical care time)  Medications Ordered in ED Medications - No data to display   Initial Impression / Assessment and Plan / ED Course  I have reviewed the triage vital signs and the nursing notes.  Pertinent labs & imaging results that were available during my care of the patient were reviewed by me and considered in my medical decision making (see chart for details).  Clinical Course     **I have reviewed nursing notes, vital signs, and all appropriate lab and imaging results for this patient.*  Final Clinical Impressions(s) / ED Diagnoses  Pt has torticolis present. There is no history of trauma to the  neck. No gross neuro deficit noted. Pt will be treated with heating pad, norco, Meloxicam, and robaxin. Pt to follow with her MD if any changes or problem.   Final diagnoses:  None    New Prescriptions New Prescriptions   No medications on file     Lily Kocher, PA-C 02/14/16 1642    Julianne Rice, MD 02/17/16 1021

## 2016-02-14 NOTE — ED Notes (Signed)
Patient states "I don't want to take that hydrocodone here cause it knocks me on my butt. I would rather take it at home." Advised patient we are not allowed to give medication to take home. Patient verbalized understanding and stated "maybe he will give me a prescription for it at home." Hydrocodone not given to patient and returned to pyxis.

## 2016-02-14 NOTE — Discharge Instructions (Signed)
Please apply heating pad to the neck and shoulders area. Use Meloxicam and robaxin daily. Use norco for more severe pain. See your MD for additonal evaluation if not improving.

## 2016-02-14 NOTE — ED Triage Notes (Signed)
Patient complains of neck pain that started after waking up Friday morning. Patient states she is unable to turn head left or right.

## 2016-02-17 ENCOUNTER — Ambulatory Visit (HOSPITAL_COMMUNITY)
Admission: RE | Admit: 2016-02-17 | Discharge: 2016-02-17 | Disposition: A | Discharge status: Home / Self Care | Payer: BLUE CROSS/BLUE SHIELD | Source: Ambulatory Visit | Attending: Pulmonary Disease | Admitting: Pulmonary Disease

## 2016-02-17 DIAGNOSIS — J849 Interstitial pulmonary disease, unspecified: Secondary | ICD-10-CM | POA: Diagnosis present

## 2016-02-17 LAB — PULMONARY FUNCTION TEST
DL/VA % PRED: 59 %
DL/VA: 2.95 ml/min/mmHg/L
DLCO unc % pred: 31 %
DLCO unc: 8.37 ml/min/mmHg
FEF 25-75 POST: 1.9 L/s
FEF 25-75 Pre: 1.74 L/sec
FEF2575-%CHANGE-POST: 9 %
FEF2575-%PRED-POST: 80 %
FEF2575-%Pred-Pre: 73 %
FEV1-%CHANGE-POST: 4 %
FEV1-%PRED-PRE: 57 %
FEV1-%Pred-Post: 59 %
FEV1-PRE: 1.52 L
FEV1-Post: 1.59 L
FEV1FVC-%CHANGE-POST: -3 %
FEV1FVC-%PRED-PRE: 108 %
FEV6-%Change-Post: 9 %
FEV6-%Pred-Post: 58 %
FEV6-%Pred-Pre: 53 %
FEV6-POST: 1.95 L
FEV6-Pre: 1.78 L
FEV6FVC-%Change-Post: 0 %
FEV6FVC-%PRED-POST: 102 %
FEV6FVC-%Pred-Pre: 103 %
FVC-%CHANGE-POST: 9 %
FVC-%PRED-POST: 56 %
FVC-%PRED-PRE: 51 %
FVC-POST: 1.96 L
FVC-PRE: 1.79 L
POST FEV1/FVC RATIO: 81 %
PRE FEV6/FVC RATIO: 100 %
Post FEV6/FVC ratio: 100 %
Pre FEV1/FVC ratio: 85 %
RV % pred: 82 %
RV: 1.72 L
TLC % pred: 68 %
TLC: 3.62 L

## 2016-02-17 MED ORDER — ALBUTEROL SULFATE (2.5 MG/3ML) 0.083% IN NEBU
2.5000 mg | INHALATION_SOLUTION | Freq: Once | RESPIRATORY_TRACT | Status: AC
  Administered 2016-02-17: 2.5 mg via RESPIRATORY_TRACT

## 2016-04-18 DIAGNOSIS — M359 Systemic involvement of connective tissue, unspecified: Principal | ICD-10-CM

## 2016-04-18 DIAGNOSIS — M3320 Polymyositis, organ involvement unspecified: Secondary | ICD-10-CM | POA: Insufficient documentation

## 2016-05-14 ENCOUNTER — Encounter (HOSPITAL_COMMUNITY): Payer: Self-pay | Admitting: Emergency Medicine

## 2016-05-14 ENCOUNTER — Inpatient Hospital Stay (HOSPITAL_COMMUNITY)
Admission: EM | Admit: 2016-05-14 | Discharge: 2016-05-19 | DRG: 682 | Disposition: A | Discharge status: Skilled Nursing Facility | Payer: BLUE CROSS/BLUE SHIELD | Attending: Internal Medicine | Admitting: Internal Medicine

## 2016-05-14 ENCOUNTER — Emergency Department (HOSPITAL_COMMUNITY): Payer: BLUE CROSS/BLUE SHIELD

## 2016-05-14 DIAGNOSIS — E871 Hypo-osmolality and hyponatremia: Secondary | ICD-10-CM | POA: Diagnosis present

## 2016-05-14 DIAGNOSIS — Z8711 Personal history of peptic ulcer disease: Secondary | ICD-10-CM

## 2016-05-14 DIAGNOSIS — K760 Fatty (change of) liver, not elsewhere classified: Secondary | ICD-10-CM | POA: Diagnosis present

## 2016-05-14 DIAGNOSIS — Z791 Long term (current) use of non-steroidal anti-inflammatories (NSAID): Secondary | ICD-10-CM

## 2016-05-14 DIAGNOSIS — E860 Dehydration: Secondary | ICD-10-CM | POA: Diagnosis present

## 2016-05-14 DIAGNOSIS — R0602 Shortness of breath: Secondary | ICD-10-CM

## 2016-05-14 DIAGNOSIS — R918 Other nonspecific abnormal finding of lung field: Secondary | ICD-10-CM

## 2016-05-14 DIAGNOSIS — I5033 Acute on chronic diastolic (congestive) heart failure: Secondary | ICD-10-CM | POA: Diagnosis present

## 2016-05-14 DIAGNOSIS — E861 Hypovolemia: Secondary | ICD-10-CM | POA: Diagnosis present

## 2016-05-14 DIAGNOSIS — Z833 Family history of diabetes mellitus: Secondary | ICD-10-CM

## 2016-05-14 DIAGNOSIS — E785 Hyperlipidemia, unspecified: Secondary | ICD-10-CM | POA: Diagnosis present

## 2016-05-14 DIAGNOSIS — E43 Unspecified severe protein-calorie malnutrition: Secondary | ICD-10-CM | POA: Diagnosis present

## 2016-05-14 DIAGNOSIS — Z923 Personal history of irradiation: Secondary | ICD-10-CM

## 2016-05-14 DIAGNOSIS — I110 Hypertensive heart disease with heart failure: Secondary | ICD-10-CM | POA: Diagnosis present

## 2016-05-14 DIAGNOSIS — M3320 Polymyositis, organ involvement unspecified: Secondary | ICD-10-CM | POA: Diagnosis present

## 2016-05-14 DIAGNOSIS — Z6826 Body mass index (BMI) 26.0-26.9, adult: Secondary | ICD-10-CM

## 2016-05-14 DIAGNOSIS — K219 Gastro-esophageal reflux disease without esophagitis: Secondary | ICD-10-CM | POA: Diagnosis present

## 2016-05-14 DIAGNOSIS — R748 Abnormal levels of other serum enzymes: Secondary | ICD-10-CM

## 2016-05-14 DIAGNOSIS — J189 Pneumonia, unspecified organism: Secondary | ICD-10-CM | POA: Diagnosis present

## 2016-05-14 DIAGNOSIS — Z853 Personal history of malignant neoplasm of breast: Secondary | ICD-10-CM | POA: Diagnosis not present

## 2016-05-14 DIAGNOSIS — Z881 Allergy status to other antibiotic agents status: Secondary | ICD-10-CM | POA: Diagnosis not present

## 2016-05-14 DIAGNOSIS — F17210 Nicotine dependence, cigarettes, uncomplicated: Secondary | ICD-10-CM | POA: Diagnosis present

## 2016-05-14 DIAGNOSIS — G8929 Other chronic pain: Secondary | ICD-10-CM | POA: Diagnosis present

## 2016-05-14 DIAGNOSIS — K224 Dyskinesia of esophagus: Secondary | ICD-10-CM | POA: Diagnosis present

## 2016-05-14 DIAGNOSIS — E876 Hypokalemia: Secondary | ICD-10-CM | POA: Diagnosis present

## 2016-05-14 DIAGNOSIS — N179 Acute kidney failure, unspecified: Principal | ICD-10-CM | POA: Diagnosis present

## 2016-05-14 DIAGNOSIS — Z8249 Family history of ischemic heart disease and other diseases of the circulatory system: Secondary | ICD-10-CM

## 2016-05-14 DIAGNOSIS — E2740 Unspecified adrenocortical insufficiency: Secondary | ICD-10-CM | POA: Diagnosis present

## 2016-05-14 DIAGNOSIS — Z803 Family history of malignant neoplasm of breast: Secondary | ICD-10-CM | POA: Diagnosis not present

## 2016-05-14 DIAGNOSIS — R1310 Dysphagia, unspecified: Secondary | ICD-10-CM

## 2016-05-14 LAB — CBC WITH DIFFERENTIAL/PLATELET
BAND NEUTROPHILS: 0 %
BASOS ABS: 0 10*3/uL (ref 0.0–0.1)
BLASTS: 0 %
Basophils Relative: 0 %
Eosinophils Absolute: 0.1 10*3/uL (ref 0.0–0.7)
Eosinophils Relative: 1 %
HCT: 39.8 % (ref 36.0–46.0)
Hemoglobin: 13.9 g/dL (ref 12.0–15.0)
LYMPHS ABS: 1.1 10*3/uL (ref 0.7–4.0)
Lymphocytes Relative: 14 %
MCH: 30.2 pg (ref 26.0–34.0)
MCHC: 34.9 g/dL (ref 30.0–36.0)
MCV: 86.5 fL (ref 78.0–100.0)
Metamyelocytes Relative: 0 %
Monocytes Absolute: 0.3 10*3/uL (ref 0.1–1.0)
Monocytes Relative: 4 %
Myelocytes: 0 %
NEUTROS ABS: 6.1 10*3/uL (ref 1.7–7.7)
NEUTROS PCT: 81 %
PLATELETS: 161 10*3/uL (ref 150–400)
Promyelocytes Absolute: 0 %
RBC: 4.6 MIL/uL (ref 3.87–5.11)
RDW: 16.5 % — AB (ref 11.5–15.5)
WBC Morphology: INCREASED
WBC: 7.6 10*3/uL (ref 4.0–10.5)
nRBC: 0 /100 WBC

## 2016-05-14 LAB — URINALYSIS, ROUTINE W REFLEX MICROSCOPIC
Bilirubin Urine: NEGATIVE
Glucose, UA: NEGATIVE mg/dL
Ketones, ur: 5 mg/dL — AB
Leukocytes, UA: NEGATIVE
Nitrite: NEGATIVE
PH: 5 (ref 5.0–8.0)
Protein, ur: NEGATIVE mg/dL
SPECIFIC GRAVITY, URINE: 1.014 (ref 1.005–1.030)

## 2016-05-14 LAB — BASIC METABOLIC PANEL
Anion gap: 20 — ABNORMAL HIGH (ref 5–15)
BUN: 67 mg/dL — AB (ref 6–20)
CALCIUM: 8.7 mg/dL — AB (ref 8.9–10.3)
CO2: 19 mmol/L — ABNORMAL LOW (ref 22–32)
CREATININE: 2.04 mg/dL — AB (ref 0.44–1.00)
Chloride: 88 mmol/L — ABNORMAL LOW (ref 101–111)
GFR calc Af Amer: 29 mL/min — ABNORMAL LOW (ref >60)
GFR, EST NON AFRICAN AMERICAN: 25 mL/min — AB (ref >60)
Glucose, Bld: 65 mg/dL (ref 65–99)
Potassium: 3 mmol/L — ABNORMAL LOW (ref 3.5–5.1)
Sodium: 127 mmol/L — ABNORMAL LOW (ref 135–145)

## 2016-05-14 LAB — HEPATIC FUNCTION PANEL
ALK PHOS: 83 U/L (ref 38–126)
ALT: 119 U/L — ABNORMAL HIGH (ref 14–54)
AST: 200 U/L — AB (ref 15–41)
Albumin: 3.2 g/dL — ABNORMAL LOW (ref 3.5–5.0)
Bilirubin, Direct: <0.1 mg/dL — ABNORMAL LOW (ref 0.1–0.5)
TOTAL PROTEIN: 6.7 g/dL (ref 6.5–8.1)
Total Bilirubin: 0.9 mg/dL (ref 0.3–1.2)

## 2016-05-14 LAB — BRAIN NATRIURETIC PEPTIDE: B NATRIURETIC PEPTIDE 5: 286 pg/mL — AB (ref 0.0–100.0)

## 2016-05-14 LAB — MAGNESIUM: Magnesium: 1.9 mg/dL (ref 1.7–2.4)

## 2016-05-14 MED ORDER — DEXTROSE 5 % IV SOLN
1.0000 g | Freq: Once | INTRAVENOUS | Status: AC
  Administered 2016-05-14: 1 g via INTRAVENOUS
  Filled 2016-05-14: qty 10

## 2016-05-14 MED ORDER — SODIUM CHLORIDE 0.9 % IV BOLUS (SEPSIS)
500.0000 mL | Freq: Once | INTRAVENOUS | Status: AC
  Administered 2016-05-14: 500 mL via INTRAVENOUS

## 2016-05-14 MED ORDER — POTASSIUM CHLORIDE 20 MEQ PO PACK
20.0000 meq | PACK | Freq: Once | ORAL | Status: AC
  Administered 2016-05-14: 20 meq via ORAL
  Filled 2016-05-14: qty 1

## 2016-05-14 MED ORDER — HYDROCORTISONE NA SUCCINATE PF 100 MG IJ SOLR
50.0000 mg | Freq: Three times a day (TID) | INTRAMUSCULAR | Status: AC
  Administered 2016-05-14 – 2016-05-15 (×3): 50 mg via INTRAVENOUS
  Filled 2016-05-14 (×3): qty 2

## 2016-05-14 MED ORDER — ACETAMINOPHEN 325 MG PO TABS
650.0000 mg | ORAL_TABLET | Freq: Once | ORAL | Status: AC
  Administered 2016-05-14: 650 mg via ORAL
  Filled 2016-05-14: qty 2

## 2016-05-14 MED ORDER — AZITHROMYCIN 250 MG PO TABS
1000.0000 mg | ORAL_TABLET | Freq: Once | ORAL | Status: AC
  Administered 2016-05-14: 1000 mg via ORAL
  Filled 2016-05-14: qty 4

## 2016-05-14 NOTE — H&P (Signed)
History and Physical    Danielle [name] [id number] DOB: 08-30-1955 DOA: 05/14/2016  PCP: Glo Herring, MD  Patient coming from: Home.    Chief Complaint:   Nausea and vomiting without abdominal pain.   HPI: Danielle [name] is an 61 y.o. 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  She has nausea, vomiting, but no diarreha.  She denied coughs, fever, or chills.  No black or bloody stools.  She has been taking NSAIDS for her chronic pain.  She has no distant travel or ill contact.  Work up in the ER showed Na of 127, K of 3.0 mE/L, normal WBC and Hb of 13,9 g per dL.  Her BUN was elevated to 67 and Cr 2.04.  CXR showed possible PNA.  Hospitalist was asked to admit her for further work up.   ED Course:  See above.  Rewiew of Systems:  Constitutional: Negative for malaise, fever and chills. No significant weight loss or weight gain Eyes: Negative for eye pain, redness and discharge, diplopia, visual changes, or flashes of light. ENMT: Negative for ear pain, hoarseness, nasal congestion, sinus pressure and sore throat. No headaches; tinnitus, drooling, or problem swallowing. Cardiovascular: Negative for chest pain, palpitations, diaphoresis, dyspnea and peripheral edema. ; No orthopnea, PND Respiratory: Negative for cough, hemoptysis, wheezing and stridor. No pleuritic chestpain. Gastrointestinal: Negative for diarrhea, constipation,  melena, blood in stool, hematemesis, jaundice and rectal bleeding.    Genitourinary: Negative for frequency, dysuria, incontinence,flank pain and hematuria; Musculoskeletal: Negative for back pain and neck pain. Negative for swelling and trauma.;  Skin: . Negative for pruritus, rash, abrasions, bruising and skin lesion.; ulcerations Neuro: Negative for headache, lightheadedness and neck stiffness. Negative for weakness,  altered level of consciousness , altered mental status, burning feet, involuntary movement, seizure and syncope.  Psych: negative for anxiety, depression, insomnia, tearfulness, panic attacks, hallucinations, paranoia, suicidal or homicidal ideation   Past Medical History:  Diagnosis Date  . Borderline hyperlipidemia    managed with diet and exercise  . Breast cancer ([place]) 08/17/2011  . Cancer ([place])   . Cough    smokers cough  . Fatty infiltration of liver 05/13/2013  . Gastric ulcer 09/17/2011   not a current issue  . Hypertension   . 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 Port catheter in place 05/13/2012  . Shortness of breath dyspnea     Past Surgical History:  Procedure Laterality Date  . AXILLARY LYMPH NODE DISSECTION  06/04/2011   Procedure: AXILLARY LYMPH NODE DISSECTION;  Surgeon: Donato Heinz, MD;  Location: [facility];  Service: General;  Laterality: Right;  . MASTECTOMY, PARTIAL  06/04/2011   Procedure: MASTECTOMY PARTIAL;  Surgeon: Donato Heinz, MD;  Location: [facility];  Service: General;  Laterality: Right;  . MUSCLE BIOPSY Right 09/08/2015   Procedure: RIGHT THIGH MUSCLE BIOPSY;  Surgeon: Georganna Skeans, MD;  Location: [facility];  Service: General;  Laterality: Right;  . PORT-A-CATH REMOVAL Left 03/22/2014   Procedure: MINOR REMOVAL PORT-A-CATH;  Surgeon: Jamesetta So, MD;  Location: [facility];  Service: General;  Laterality: Left;  . PORTACATH PLACEMENT  07/23/2011   Procedure: INSERTION PORT-A-CATH;  Surgeon: Donato Heinz, MD;  Location: [facility];  Service: General;  Laterality: Left;  Left Subclavian  . SKIN BIOPSY  12/19/2011   Procedure: BIOPSY SKIN;  Surgeon: Donato Heinz, MD;  Location: [facility];  Service: General;  Laterality: Right;  In Minor Room  . TUBAL LIGATION  1980     reports that she has been smoking Cigarettes.  She has a 8.80 pack-year smoking history. She has  never used smokeless tobacco. She reports that she drinks about 16.8 oz of alcohol per week . She reports that she does not use drugs.  Allergies  Allergen Reactions  . Bactrim [Sulfamethoxazole-Trimethoprim] Nausea Only and Other (See Comments)    Fever and chills along with the nausea.  . Cephalexin Palpitations    Reaction:Heart flutter    Family History  Problem Relation Age of Onset  . Heart disease Mother   . Diabetes Mother   . Heart disease Father   . Diabetes Father   . Cancer Sister     breast cancer  . Anesthesia problems Neg Hx   . Malignant hyperthermia Neg Hx   . Pseudochol deficiency Neg Hx      Prior to Admission medications   Medication Sig Start Date End Date Taking? Authorizing Provider  acetaminophen (TYLENOL) 500 MG tablet Take 500 mg by mouth every 8 (eight) hours as needed for mild pain or moderate pain.    Yes Historical Provider, MD  albuterol (ACCUNEB) 0.63 MG/3ML nebulizer solution Take 1 ampule by nebulization every 6 (six) hours as needed for wheezing or shortness of breath.  04/18/16 04/18/17 Yes Historical Provider, MD  ibuprofen (ADVIL,MOTRIN) 200 MG tablet Take 200 mg by mouth every 6 (six) hours as needed for mild pain or moderate pain.    Yes Historical Provider, MD  lisinopril-hydrochlorothiazide (PRINZIDE,ZESTORETIC) 20-25 MG tablet Take 2 tablets by mouth daily.  03/30/16  Yes Historical Provider, MD  HYDROcodone-acetaminophen (NORCO/VICODIN) 5-325 MG tablet Take 1 tablet by mouth every 4 (four) hours as needed. Patient not taking: Reported on 05/14/2016 02/14/16   Lily Kocher, PA-C  meloxicam (MOBIC) 15 MG tablet Take 1 tablet (15 mg total) by mouth daily. Patient not taking: Reported on 05/14/2016 02/14/16   Lily Kocher, PA-C  methocarbamol (ROBAXIN) 500 MG tablet Take 1 tablet (500 mg total) by mouth 3 (three) times daily. Patient not taking: Reported on 05/14/2016 02/14/16   Lily Kocher, PA-C    Physical Exam: Vitals:   05/14/16 2015 05/14/16  2017 05/14/16 2030 05/14/16 2130  BP: (!) 141/72 (!) 141/72 (!) 144/74 110/83  Pulse: 93 92 92 99  Resp:  18 (!) 34 (!) 25  Temp: 97.4 F (36.3 C) 97.4 F (36.3 C)    TempSrc: Oral Oral    SpO2: 96% 96% 95% 94%  Weight:      Height:          Constitutional: NAD, calm, comfortable Vitals:   05/14/16 2015 05/14/16 2017 05/14/16 2030 05/14/16 2130  BP: (!) 141/72 (!) 141/72 (!) 144/74 110/83  Pulse: 93 92 92 99  Resp:  18 (!) 34 (!) 25  Temp: 97.4 F (36.3 C) 97.4 F (36.3 C)    TempSrc: Oral Oral    SpO2: 96% 96% 95% 94%  Weight:      Height:       Eyes: PERRL, lids and conjunctivae normal ENMT: Mucous membranes are moist. Posterior pharynx clear of any exudate or lesions.Normal dentition.  Neck: normal, supple, no masses, no thyromegaly Respiratory: clear to auscultation bilaterally, no wheezing, no crackles. Normal respiratory effort. No  accessory muscle use.  Cardiovascular: Regular rate and rhythm, no murmurs / rubs / gallops. No extremity edema. 2+ pedal pulses. No carotid bruits.  Abdomen: no tenderness, no masses palpated. No hepatosplenomegaly. Bowel sounds positive.  Musculoskeletal: no clubbing / cyanosis. No joint deformity upper and lower extremities. Good ROM, no contractures. Normal muscle tone.  Skin: no rashes, lesions, ulcers. No induration Neurologic: CN 2-12 grossly intact. Sensation intact, DTR normal. Strength 5/5 in all 4.  Psychiatric: Normal judgment and insight. Alert and oriented x 3. Normal mood.     Labs on Admission: I have personally reviewed following labs and imaging studies  CBC:  Recent Labs Lab 05/14/16 1720  WBC 7.6  NEUTROABS 6.1  HGB 13.9  HCT 39.8  MCV 86.5  PLT 465   Basic Metabolic Panel:  Recent Labs Lab 05/14/16 1720  NA 127*  K 3.0*  CL 88*  CO2 19*  GLUCOSE 65  BUN 67*  CREATININE 2.04*  CALCIUM 8.7*   GFR: Estimated Creatinine Clearance: 28.3 mL/min (A) (by C-G formula based on SCr of 2.04 mg/dL  (H)). Liver Function Tests:  Recent Labs Lab 05/14/16 1720  AST 200*  ALT 119*  ALKPHOS 83  BILITOT 0.9  PROT 6.7  ALBUMIN 3.2*   Urine analysis:    Component Value Date/Time   COLORURINE YELLOW 05/14/2016 1642   APPEARANCEUR HAZY (A) 05/14/2016 1642   LABSPEC 1.014 05/14/2016 1642   PHURINE 5.0 05/14/2016 1642   GLUCOSEU NEGATIVE 05/14/2016 1642   HGBUR LARGE (A) 05/14/2016 1642   BILIRUBINUR NEGATIVE 05/14/2016 1642   KETONESUR 5 (A) 05/14/2016 1642   PROTEINUR NEGATIVE 05/14/2016 1642   UROBILINOGEN 0.2 08/17/2011 1358   NITRITE NEGATIVE 05/14/2016 1642   LEUKOCYTESUR NEGATIVE 05/14/2016 1642     Radiological Exams on Admission: Dg Chest Port 1 View  Result Date: 05/14/2016 CLINICAL DATA:  Weakness.  Breast cancer.  Hypertension. EXAM: PORTABLE CHEST 1 VIEW COMPARISON:  09/05/2015 FINDINGS: Airspace opacities at both lung bases and in the periphery of both upper lung zones. There is at least moderate enlargement of the cardiopericardial silhouette with atherosclerotic calcification of the aortic arch. Questionable right paratracheal adenopathy. Both lung bases are obscured. Right axillary vascular clips noted. IMPRESSION: 1. Bibasilar airspace opacities with obscuration of the hemidiaphragms. Indistinct peripheral opacities in the upper lobes. Appearance could be from a variety of conditions including resolving pulmonary edema or multilobar pneumonia. 2. Cardiomegaly. Electronically Signed   By: Van Clines M.D.   On: 05/14/2016 20:27    EKG: Independently reviewed.   Assessment/Plan Principal Problem:   AKI (acute kidney injury) ([place]) Active Problems:   Adrenal insufficiency (H[facility])   CAP (community acquired pneumonia)   Hypokalemia   PLAN:   AKI:  Suspicious for pre renal cause, aggravated with ACE I and diuretic use.   Will d/c Zesteretics, give IVF, follow Cr.  I suspect she will improve.  D/C  All NSAIDs.  Adrenal Insufficiency:  She is off steroids now.   But in Oct and June of last year, she had been on 60mg  per day for months.  Will give stress dose steroid, and start low dose Prednisone.  This could explain weakness, nausea, and vomiting.   CAP:  CXR is abnormal.  She has no leukocytosis, fever, or coughs.  She was given IV Rocephin and Zithromax in the ER.  She listed having palpitation with Keflex.  Will give IV Levoquin, though I have low clinical suspicious for her having CAP.   Hyponatremia:  Hypovolemic.  Will give IVF.  D/C Diuretics.  Hypokalemia:  Supplement.  Check Mag.    DVT prophylaxis: Heparin SUB.  Code Status: FULL CODE.  Family Communication: None.  Disposition Plan: Home.  Consults called: None.  Admission status: Inpatient.    Zoltan Genest MD FACP. [facility]  If 7PM-7AM, please contact night-coverage [email or web address] Password [email or web address]  05/14/2016, 10:07 PM

## 2016-05-14 NOTE — ED Provider Notes (Signed)
[facility] DEPT Provider Note   CSN: [id number] Arrival date & time: 05/14/16  1626     History   Chief Complaint Chief Complaint  Patient presents with  . Nausea  . Weakness    HPI Danielle [name] is a 61 y.o. female.  HPI  This is a 61 year old female with a history of breast cancer treated with lumpectomy and radiation, sinusitis who comes in today complaining of 2 weeks of nausea and vomiting. She states that she has been running some low-grade fevers at home up to 100. She states that whenever she puts into to keep coming back up. She denies his vomiting this states that things will not stay down. She has been taking Advil at home for fever. She describes shortness of breath is been present for 2 months and is gradually worsening. She is a smoker and continues to smoke but states that she is not normally on oxygen although she has been using some albuterol at home.  Past Medical History:  Diagnosis Date  . Borderline hyperlipidemia    managed with diet and exercise  . Breast cancer ([place]) 08/17/2011  . Cancer ([place])   . Cough    smokers cough  . Fatty infiltration of liver 05/13/2013  . Gastric ulcer 09/17/2011   not a current issue  . Hypertension   . 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 Port catheter in place 05/13/2012  . Shortness of breath dyspnea     Patient Active Problem List   Diagnosis Date Noted  . Fatty infiltration of liver 05/13/2013  . Stiffness of joint, not elsewhere classified, other specified site 01/27/2013  . Lymphedema of arm 01/27/2013  . Stress fracture of foot 06/04/2012  . 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 Pain in joint, shoulder region 04/15/2012  . Muscle tightness 04/15/2012  . Gastric ulcer 09/17/2011  . 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 Hypertension 08/17/2011    Past Surgical History:    Procedure Laterality Date  . AXILLARY LYMPH NODE DISSECTION  06/04/2011   Procedure: AXILLARY LYMPH NODE DISSECTION;  Surgeon: Donato Heinz, MD;  Location: [facility];  Service: General;  Laterality: Right;  . MASTECTOMY, PARTIAL  06/04/2011   Procedure: MASTECTOMY PARTIAL;  Surgeon: Donato Heinz, MD;  Location: [facility];  Service: General;  Laterality: Right;  . MUSCLE BIOPSY Right 09/08/2015   Procedure: RIGHT THIGH MUSCLE BIOPSY;  Surgeon: Georganna Skeans, MD;  Location: [facility];  Service: General;  Laterality: Right;  . PORT-A-CATH REMOVAL Left 03/22/2014   Procedure: MINOR REMOVAL PORT-A-CATH;  Surgeon: Jamesetta So, MD;  Location: [facility];  Service: General;  Laterality: Left;  . PORTACATH PLACEMENT  07/23/2011   Procedure: INSERTION PORT-A-CATH;  Surgeon: Donato Heinz, MD;  Location: [facility];  Service: General;  Laterality: Left;  Left Subclavian  . SKIN BIOPSY  12/19/2011   Procedure: BIOPSY SKIN;  Surgeon: Donato Heinz, MD;  Location: [facility];  Service: General;  Laterality: Right;  In Minor Room  . TUBAL LIGATION  1980    OB History    No data available       Home Medications    Prior to Admission medications   Medication Sig Start Date End Date Taking? Authorizing Provider  furosemide (LASIX) 20 MG tablet Take by mouth. 10/18/15  Yes Historical Provider, MD  acetaminophen (TYLENOL) 500 MG tablet  Take by mouth.    Historical Provider, MD  HYDROcodone-acetaminophen (NORCO/VICODIN) 5-325 MG tablet Take 1 tablet by mouth every 4 (four) hours as needed. Patient not taking: Reported on 05/14/2016 02/14/16   Lily Kocher, PA-C  ibuprofen (ADVIL,MOTRIN) 200 MG tablet Take by mouth.    Historical Provider, MD  lisinopril-hydrochlorothiazide (PRINZIDE,ZESTORETIC) 20-25 MG tablet 2 tablets daily. 03/30/16   Historical Provider, MD  losartan (COZAAR) 25 MG tablet Take 1 tablet (25 mg total) by mouth daily. 10/03/15   Brand Males, MD  meloxicam (MOBIC) 15 MG tablet Take 1 tablet (15 mg  total) by mouth daily. Patient not taking: Reported on 05/14/2016 02/14/16   Lily Kocher, PA-C  methocarbamol (ROBAXIN) 500 MG tablet Take 1 tablet (500 mg total) by mouth 3 (three) times daily. Patient not taking: Reported on 05/14/2016 02/14/16   Lily Kocher, PA-C    Family History Family History  Problem Relation Age of Onset  . Heart disease Mother   . Diabetes Mother   . Heart disease Father   . Diabetes Father   . Cancer Sister     breast cancer  . Anesthesia problems Neg Hx   . Malignant hyperthermia Neg Hx   . Pseudochol deficiency Neg Hx     Social History Social History  Substance Use Topics  . Smoking status: Current Every Day Smoker    Packs/day: 0.20    Years: 44.00    Types: Cigarettes  . Smokeless tobacco: Never Used     Comment: 0.5ppd 10/03/15  . Alcohol use 16.8 oz/week    28 Glasses of wine per week     Comment: used to quit 3 month ago (3-4 glasses of wine a night but has cut back now     Allergies   Bactrim [sulfamethoxazole-trimethoprim] and Cephalexin   Review of Systems Review of Systems   Physical Exam Updated Vital Signs BP (!) 141/72 (BP Location: Right Arm)   Pulse 93   Temp 97.4 F (36.3 C) (Oral)   Resp (!) 28   Ht 5\' 4"  (1.626 m)   Wt 72.6 kg   SpO2 96%   BMI 27.46 kg/m   Physical Exam  Constitutional: She appears well-developed and well-nourished.  HENT:  Head: Normocephalic and atraumatic.  Right Ear: External ear normal.  Left Ear: External ear normal.  Eyes: Conjunctivae and EOM are normal. Pupils are equal, round, and reactive to light.  Neck: Normal range of motion. Neck supple.  Cardiovascular: Normal rate, regular rhythm, normal heart sounds and intact distal pulses.   Pulmonary/Chest:  Bilateral decreased lung sounds with crackles  Abdominal: Soft. Bowel sounds are normal.  Musculoskeletal: Normal range of motion.  Neurological: She is alert.  Skin: Skin is warm and dry.  Psychiatric: She has a normal mood and  affect.  Nursing note and vitals reviewed.    ED Treatments / Results  Labs (all labs ordered are listed, but only abnormal results are displayed) Labs Reviewed  CBC WITH DIFFERENTIAL/PLATELET - Abnormal; Notable for the following:       Result Value   RDW 16.5 (*)    All other components within normal limits  BASIC METABOLIC PANEL - Abnormal; Notable for the following:    Sodium 127 (*)    Potassium 3.0 (*)    Chloride 88 (*)    CO2 19 (*)    BUN 67 (*)    Creatinine, Ser 2.04 (*)    Calcium 8.7 (*)    GFR calc non Af Wyvonnia Lora  25 (*)    GFR calc Af Amer 29 (*)    Anion gap 20 (*)    All other components within normal limits  URINALYSIS, ROUTINE W REFLEX MICROSCOPIC  COMPREHENSIVE METABOLIC PANEL    EKG  EKG Interpretation None       Radiology No results found.  Procedures Procedures (including critical care time)  Medications Ordered in ED Medications  sodium chloride 0.9 % bolus 500 mL (500 mLs Intravenous New Bag/Given 05/14/16 1959)     Initial Impression / Assessment and Plan / ED Course  I have reviewed the triage vital signs and the nursing notes.  Pertinent labs & imaging results that were available during my care of the patient were reviewed by me and considered in my medical decision making (see chart for details).    1- weakness-likely multifactorial- h.o. Myositis, chf vs pneumonia, hyponatremia 2- hyponatremia/hypokalemia- iv fluids potassium po 3- ? Pneumonia- rocephin and zithromax ordered 4-?chf- bnp pending 5- aki- creatinine increased to 2.04 from baseline of .82 7/17- c.w. Volume depletion 6-elevated lft  Discussed with Dr. Marin Comment and he will see for admission  Final Clinical Impressions(s) / ED Diagnoses   Final diagnoses:  Pulmonary infiltrates on CXR  AKI (acute kidney injury) ([place])  Hyponatremia    New Prescriptions New Prescriptions   No medications on file     Pattricia Boss, MD 05/14/16 2137

## 2016-05-14 NOTE — ED Notes (Signed)
sats 85% on room air

## 2016-05-14 NOTE — ED Triage Notes (Signed)
Pt on treatments for "polymitosis". Weakness, n/v with po's x 1 week. Mm moist to dry. gen weaknes noted. ble swelling noted , pt states x 2 months now. A/o

## 2016-05-15 DIAGNOSIS — E43 Unspecified severe protein-calorie malnutrition: Secondary | ICD-10-CM | POA: Insufficient documentation

## 2016-05-15 DIAGNOSIS — E2740 Unspecified adrenocortical insufficiency: Secondary | ICD-10-CM

## 2016-05-15 LAB — BASIC METABOLIC PANEL
Anion gap: 17 — ABNORMAL HIGH (ref 5–15)
BUN: 66 mg/dL — AB (ref 6–20)
CO2: 21 mmol/L — ABNORMAL LOW (ref 22–32)
CREATININE: 1.69 mg/dL — AB (ref 0.44–1.00)
Calcium: 8.5 mg/dL — ABNORMAL LOW (ref 8.9–10.3)
Chloride: 90 mmol/L — ABNORMAL LOW (ref 101–111)
GFR calc Af Amer: 37 mL/min — ABNORMAL LOW (ref >60)
GFR, EST NON AFRICAN AMERICAN: 32 mL/min — AB (ref >60)
Glucose, Bld: 117 mg/dL — ABNORMAL HIGH (ref 65–99)
Potassium: 3.8 mmol/L (ref 3.5–5.1)
Sodium: 128 mmol/L — ABNORMAL LOW (ref 135–145)

## 2016-05-15 LAB — CBC
HCT: 39.7 % (ref 36.0–46.0)
Hemoglobin: 13.6 g/dL (ref 12.0–15.0)
MCH: 30 pg (ref 26.0–34.0)
MCHC: 34.3 g/dL (ref 30.0–36.0)
MCV: 87.4 fL (ref 78.0–100.0)
PLATELETS: 154 10*3/uL (ref 150–400)
RBC: 4.54 MIL/uL (ref 3.87–5.11)
RDW: 16.5 % — ABNORMAL HIGH (ref 11.5–15.5)
WBC: 8 10*3/uL (ref 4.0–10.5)

## 2016-05-15 MED ORDER — PANTOPRAZOLE SODIUM 40 MG PO TBEC
40.0000 mg | DELAYED_RELEASE_TABLET | Freq: Every day | ORAL | Status: DC
  Administered 2016-05-15 – 2016-05-19 (×5): 40 mg via ORAL
  Filled 2016-05-15 (×5): qty 1

## 2016-05-15 MED ORDER — ACETAMINOPHEN 500 MG PO TABS
500.0000 mg | ORAL_TABLET | Freq: Three times a day (TID) | ORAL | Status: DC | PRN
  Filled 2016-05-15: qty 1

## 2016-05-15 MED ORDER — BOOST / RESOURCE BREEZE PO LIQD
1.0000 | Freq: Three times a day (TID) | ORAL | Status: DC
  Administered 2016-05-15 – 2016-05-17 (×6): 1 via ORAL

## 2016-05-15 MED ORDER — POTASSIUM CHLORIDE CRYS ER 20 MEQ PO TBCR
20.0000 meq | EXTENDED_RELEASE_TABLET | Freq: Every day | ORAL | Status: DC
  Administered 2016-05-15 – 2016-05-17 (×4): 20 meq via ORAL
  Filled 2016-05-15 (×4): qty 1

## 2016-05-15 MED ORDER — POTASSIUM CL IN DEXTROSE 5% 20 MEQ/L IV SOLN
20.0000 meq | INTRAVENOUS | Status: DC
  Administered 2016-05-15 – 2016-05-16 (×4): 20 meq via INTRAVENOUS
  Filled 2016-05-15 (×7): qty 1000

## 2016-05-15 MED ORDER — LEVOFLOXACIN IN D5W 750 MG/150ML IV SOLN
750.0000 mg | INTRAVENOUS | Status: DC
  Filled 2016-05-15: qty 150

## 2016-05-15 MED ORDER — HEPARIN SODIUM (PORCINE) 5000 UNIT/ML IJ SOLN
5000.0000 [IU] | Freq: Three times a day (TID) | INTRAMUSCULAR | Status: DC
  Administered 2016-05-15 – 2016-05-19 (×13): 5000 [IU] via SUBCUTANEOUS
  Filled 2016-05-15 (×14): qty 1

## 2016-05-15 MED ORDER — ADULT MULTIVITAMIN W/MINERALS CH
1.0000 | ORAL_TABLET | Freq: Every day | ORAL | Status: DC
  Administered 2016-05-15 – 2016-05-19 (×5): 1 via ORAL
  Filled 2016-05-15 (×5): qty 1

## 2016-05-15 MED ORDER — ALBUTEROL SULFATE (2.5 MG/3ML) 0.083% IN NEBU
2.5000 mg | INHALATION_SOLUTION | Freq: Every day | RESPIRATORY_TRACT | Status: DC
  Administered 2016-05-16 – 2016-05-19 (×4): 2.5 mg via RESPIRATORY_TRACT
  Filled 2016-05-15 (×4): qty 3

## 2016-05-15 MED ORDER — ONDANSETRON HCL 4 MG/2ML IJ SOLN: 4.0000 mg | Freq: Four times a day (QID) | INTRAMUSCULAR | Status: DC | PRN

## 2016-05-15 MED ORDER — HYDROCODONE-ACETAMINOPHEN 5-325 MG PO TABS
1.0000 | ORAL_TABLET | ORAL | Status: DC | PRN
  Administered 2016-05-15 – 2016-05-16 (×5): 1 via ORAL
  Filled 2016-05-15 (×5): qty 1

## 2016-05-15 MED ORDER — ONDANSETRON HCL 4 MG PO TABS: 4.0000 mg | ORAL_TABLET | Freq: Four times a day (QID) | ORAL | Status: DC | PRN

## 2016-05-15 MED ORDER — ORAL CARE MOUTH RINSE
15.0000 mL | Freq: Two times a day (BID) | OROMUCOSAL | Status: DC
  Administered 2016-05-15 – 2016-05-17 (×4): 15 mL via OROMUCOSAL

## 2016-05-15 MED ORDER — LEVOFLOXACIN IN D5W 750 MG/150ML IV SOLN
750.0000 mg | INTRAVENOUS | Status: DC
  Administered 2016-05-15: 750 mg via INTRAVENOUS
  Filled 2016-05-15: qty 150

## 2016-05-15 MED ORDER — PRO-STAT SUGAR FREE PO LIQD
30.0000 mL | Freq: Three times a day (TID) | ORAL | Status: DC
  Administered 2016-05-15 – 2016-05-19 (×9): 30 mL via ORAL
  Filled 2016-05-15 (×10): qty 30

## 2016-05-15 MED ORDER — ALBUTEROL SULFATE (2.5 MG/3ML) 0.083% IN NEBU
2.5000 mg | INHALATION_SOLUTION | RESPIRATORY_TRACT | Status: DC | PRN
Start: 2016-05-15 — End: 2016-05-19
  Administered 2016-05-15: 2.5 mg via RESPIRATORY_TRACT
  Filled 2016-05-15: qty 3

## 2016-05-15 MED ORDER — PREDNISONE 20 MG PO TABS
20.0000 mg | ORAL_TABLET | Freq: Two times a day (BID) | ORAL | Status: DC
  Administered 2016-05-15 – 2016-05-17 (×5): 20 mg via ORAL
  Filled 2016-05-15 (×5): qty 1

## 2016-05-15 NOTE — Progress Notes (Signed)
Initial Nutrition Assessment  DOCUMENTATION CODES:   Severe malnutrition in context of acute illness/injury (nausea and vomiting past 2 weeks)  INTERVENTION:  Boost Breeze po TID, each supplement provides 250 kcal and 9 grams of protein   ProStat 30 ml TID (each 30 ml provides 100 kcal, 15 gr protein)    NUTRITION DIAGNOSIS:   Malnutrition (Severe) related to acute illness (nausea and vomiting for past 2 weeks) as evidenced by energy intake < or equal to 50% for > or equal to 5 days, percent weight loss (10% in < 3 months).   GOAL:   Patient will meet greater than or equal to 90% of their needs   MONITOR:   Diet advancement, Supplement acceptance, PO intake, Labs, Weight trends  REASON FOR ASSESSMENT:   Malnutrition Screening Tool    ASSESSMENT: the patient is a 61 yo female with hx of breast cancer, HTN, PUD and myositis. She is a smoker and consumes alcohol daily. The pt reports intermittent nausea and vomiting for the past 2 weeks. She says she feels hungry but then is unable to eat. Her home diet is regular and currently she's receiving clear liquids. This morning she drank her broth and po's are averaging 25-40% of her liquid diet. She was hypovolemic on admission and is receiving fluids. Oral nutrition supplements are limited to Boost Breeze and protein modular. Once her tolerance improves and she is able to advance diet RD will re-visit oral nutrition choices.  Nutrition-Focused physical exam findings are mild-moderate fat depletion(orbital and upper arms) , moderate muscle depletion (temple, clavicle, patellar) and mild edema.      Recent Labs Lab 05/14/16 1720 05/15/16 0457  NA 127* 128*  K 3.0* 3.8  CL 88* 90*  CO2 19* 21*  BUN 67* 66*  CREATININE 2.04* 1.69*  CALCIUM 8.7* 8.5*  MG 1.9  --   GLUCOSE 65 117*   Labs: hyponatremia, BUN 66, Cr 1.69  Meds: Protonix, K-dur, Prednisone.   IVF- D5 with Kcl @ 100 ml/hr  Diet Order:  Diet clear liquid Room  service appropriate? Yes; Fluid consistency: Thin  Skin:  Reviewed, no issues  Last BM:  05/14/16  Height:   Ht Readings from Last 1 Encounters:  05/14/16 5\' 4"  (1.626 m)    Weight:   Wt Readings from Last 1 Encounters:  05/14/16 156 lb 6.4 oz (70.9 kg)    Ideal Body Weight:  55 kg  BMI:  Body mass index is 26.85 kg/m.  Estimated Nutritional Needs:   Kcal:  1772-1917 (25-27 kcal/kg bw)  Protein:  85-92 gr (1.2-1.3 gr/kg)  Fluid:  1.8-1.9 liters daily  EDUCATION NEEDS:   No education needs identified at this time   Colman Cater MS,RD,CSG,LDN Office: #[phone] Pager: [phone]

## 2016-05-15 NOTE — Progress Notes (Signed)
PHARMACY NOTE:  ANTIMICROBIAL RENAL DOSAGE ADJUSTMENT  Current antimicrobial regimen includes a mismatch between antimicrobial dosage and estimated renal function.  As per policy approved by the Pharmacy & Therapeutics and Medical Executive Committees, the antimicrobial dosage will be adjusted accordingly.  Current antimicrobial dosage:  Levaquin 750mg  IV q24hrs  Indication: CAP  Renal Function: Estimated Creatinine Clearance: 33.8 mL/min (A) (by C-G formula based on SCr of 1.69 mg/dL (H)).    Antimicrobial dosage has been changed to:  Levaquin 750mg  IV q48hrs  Additional comments:  Monitor labs, progress, renal fxn, c/s  Thank you for allowing pharmacy to be a part of this patient's care.  Ena Dawley, [facility] 05/15/2016 1:45 PM

## 2016-05-15 NOTE — Evaluation (Signed)
Physical Therapy Evaluation Patient Details Name: Danielle [name] MRN: [id number] DOB: [birth year] Today's Date: 05/15/2016   History of Present Illness  61 y.o. 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  She has nausea, vomiting, but no diarreha.  She denied coughs, fever, or chills.  No black or bloody stools.  She has been taking NSAIDS for her chronic pain.  She has no distant travel or ill contact.  Work up in the ER showed Na of 127, K of 3.0 mE/L, normal WBC and Hb of 13,9 g per dL.  Her BUN was elevated to 67 and Cr 2.04.  CXR showed possible PNA.  Hospitalist was asked to admit her for further work up.     Clinical Impression  Pt received in bed, and was agreeable to PT.  Pt states she has most recently been ambulating in the house with a RW, however, she does not really complete her ADL's.  Pt states she does not get dressed, she only throws a housecoat on, and she does not really bathe - maybe an occasional sponge bath.  Although she lives with her husband, she states that he is out visiting others most of the day.  During PT evaluation, she ambulated 77ft with RW and Min A due to multiple episodes of L knee buckling.  She demonstrates poor ability to bear weight through UE's when using the RW or performing transfer tasks.  Recommend OT for further ADL training, and energy conservation, as well as SNF at d/c due to poor home support system.      Follow Up Recommendations SNF    Equipment Recommendations  None recommended by PT    Recommendations for Other Services OT consult (Spoke with Dr. Karleen Hampshire to obtain for ADL's and energy conservation)     Precautions / Restrictions Precautions Precautions: Fall Precaution Comments: due to immobiltiy  Restrictions Weight Bearing Restrictions: No      Mobility  Bed Mobility Overal bed mobility: Needs  Assistance Bed Mobility: Supine to Sit     Supine to sit: Min assist;HOB elevated     General bed mobility comments: increased time and vc's for hand placement.  PT states that she normally sleeps in a recliner.   Transfers Overall transfer level: Needs assistance Equipment used: Rolling walker (2 wheeled) Transfers: Sit to/from Stand Sit to Stand: Min guard            Ambulation/Gait Ambulation/Gait assistance: Min assist Ambulation Distance (Feet): 20 Feet Assistive device: Rolling walker (2 wheeled) Gait Pattern/deviations: Step-to pattern;Antalgic   Gait velocity interpretation: <1.8 ft/sec, indicative of risk for recurrent falls General Gait Details: L knee buckling with pt stating rate of perceived exertion a 9/10 on the modified BORG scale.   Stairs            Wheelchair Mobility    Modified Rankin (Stroke Patients Only)       Balance Overall balance assessment: Needs assistance Sitting-balance support: Bilateral upper extremity supported;Feet supported Sitting balance-Leahy Scale: Good     Standing balance support: Bilateral upper extremity supported Standing balance-Leahy Scale: Fair                               Pertinent Vitals/Pain Pain Assessment: 0-10 Pain Score: 8  Pain Location: back -  chronic since October due to arthritis.  Pain Descriptors / Indicators: Burning Pain Intervention(s): Limited activity within patient's tolerance;Monitored during session;Repositioned    Home Living   Living Arrangements: Spouse/significant other (husband leaves at 9am, and comes back around 5pm - goes all over town visiting. ) Available Help at Discharge: Family (grandchildren come to help with cleaning.  ) Type of Home: House Home Access: Stairs to enter   [employer] of Steps: 3 with HR Home Layout: One level Home Equipment: Walker - 2 wheels;Cane - single point;Bedside commode;Shower seat (Pt expressed she has recently been  sleeping in a recliner. )      Prior Function Level of Independence: Independent with assistive device(s)   Gait / Transfers Assistance Needed: Pt uses RW for ambulation.  Pt states she has not been able to access the community since January due to weakness.   ADL's / Homemaking Assistance Needed: pt states she is not able to get dressed, but there is noone to help her.  She just throws on a housecoat.  Pt states she has tried to take sponge baths.          Hand Dominance   Dominant Hand: Right    Extremity/Trunk Assessment   Upper Extremity Assessment Upper Extremity Assessment: Generalized weakness    Lower Extremity Assessment Lower Extremity Assessment: Generalized weakness       Communication   Communication: No difficulties  Cognition Arousal/Alertness: Awake/alert Behavior During Therapy: WFL for tasks assessed/performed Overall Cognitive Status: Within Functional Limits for tasks assessed                                        General Comments      Exercises     Assessment/Plan    PT Assessment Patient needs continued PT services  PT Problem List Decreased strength;Decreased activity tolerance;Decreased balance;Decreased mobility;Decreased knowledge of use of DME;Decreased safety awareness       PT Treatment Interventions DME instruction;Gait training;Functional mobility training;Therapeutic activities;Therapeutic exercise;Balance training;Cognitive remediation;Patient/family education    PT Goals (Current goals can be found in the Care Plan section)  Acute Rehab PT Goals Patient Stated Goal: Pt wants to get stronger and be more independent.  PT Goal Formulation: With patient Time For Goal Achievement: 05/22/16    Frequency Min 3X/week   Barriers to discharge Decreased caregiver support Although she lives with her husband, she states that he is gone most of the day, and her children will not assist her with ADL's.       Co-evaluation               End of Session Equipment Utilized During Treatment: Gait belt;Oxygen Activity Tolerance: Patient limited by fatigue Patient left: in chair;with call bell/phone within reach Nurse Communication: Mobility status (mobility sheet left hanging in pt's room. ) PT Visit Diagnosis: Muscle weakness (generalized) (M62.81);Other abnormalities of gait and mobility (R26.89)    Time: 1027-2536 PT Time Calculation (min) (ACUTE ONLY): 26 min   Charges:   PT Evaluation $PT Eval Low Complexity: 1 Procedure PT Treatments $Gait Training: 8-22 mins   PT G Codes:   PT G-Codes **NOT FOR INPATIENT CLASS** Functional Assessment Tool Used: AM-PAC 6 Clicks Basic Mobility;Clinical judgement Functional Limitation: Mobility: Walking and moving around Mobility: Walking and Moving Around Current Status (U4403): At least 40 percent but less than 60 percent impaired, limited or restricted Mobility: Walking and Moving Around  Goal Status [phone]): At least 20 percent but less than 40 percent impaired, limited or restricted    Beth Cailyn Houdek, PT, DPT X: [phone]

## 2016-05-15 NOTE — Care Management Note (Signed)
Case Management Note  Patient Details  Name: Danielle [name] MRN: [id number] Date of Birth: 1955-09-15  Subjective/Objective:      Adm with AKI. From home with husband, reports decreased mobility recently. No HH PTA. Walks with RW.          Action/Plan: PT consult placed. CM will follow for needs.   Expected Discharge Date:       05/17/2016           Expected Discharge Plan:     In-House Referral:     Discharge planning Services  CM Consult  Post Acute Care Choice:    Choice offered to:     DME Arranged:    DME Agency:     HH Arranged:    HH Agency:     Status of Service:  In process, will continue to follow  If discussed at Long Length of Stay Meetings, dates discussed:    Additional Comments:  Donald Memoli, Chauncey Reading, RN 05/15/2016, 1:03 PM

## 2016-05-15 NOTE — Progress Notes (Signed)
PROGRESS NOTE    Danielle [name]  KGY:[id number] DOB: 1955/12/22 DOA: 05/14/2016 PCP: Glo Herring, MD    Brief Narrative:  Danielle [name] is an 62 y.o. 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 Her CXR showed possible pneumonia. She was admitted for further evaluation.   Assessment & Plan:   Principal Problem:   AKI (acute kidney injury) ([place]) Active Problems:   Adrenal insufficiency ([place])   CAP (community acquired pneumonia)   Hypokalemia   Hyponatremia   Protein-calorie malnutrition, severe   Generalized malaise, nausea, vomiting and abdominal pain:? Adrenal insufficiency. Restarted her on low dose steroids.  Her symptoms have improved.    CAP:   started her IV levaquin. She is comfortable on RA. No fever or sob or cough at this time.   Hyponatremia: suspect from dehydration. Gentle hydration and d/c diuretics.   Hypokalemia: replaced.  Acute kidney injury: Probably secondary from diuretics and ace use. Stopped them, gentle hydration, and repeat renal parameters in am show improvement. Continue with hydration..   Elevated liver function tests: Get abd [place] in am. And repeat liver function tests.     DVT prophylaxis: (heparin) Code Status: (Full//) Family Communication: family at bedside.  Disposition Plan: pending further evaluation.    Consultants:   None.    Procedures: none.    Antimicrobials:    Subjective: Nausea, vomiting resolved.   Objective: Vitals:   05/14/16 2317 05/15/16 0512 05/15/16 1120 05/15/16 1447  BP: (!) 156/76 (!) 142/74  (!) 157/84  Pulse: 98 88  92  Resp: 20 20  19   Temp: 97.7 F (36.5 C) 98.2 F (36.8 C)  97.7 F (36.5 C)  TempSrc: Oral Oral  Oral  SpO2:  92% 92% 91%  Weight: 70.9 kg (156 lb 6.4 oz)     Height: 5\' 4"   (1.626 m)       Intake/Output Summary (Last 24 hours) at 05/15/16 1909 Last data filed at 05/15/16 1811  Gross per 24 hour  Intake             1293 ml  Output             1000 ml  Net              293 ml   Filed Weights   05/14/16 1637 05/14/16 2317  Weight: 72.6 kg (160 lb) 70.9 kg (156 lb 6.4 oz)    Examination:  General exam: Appears calm and comfortable  Respiratory system: Clear to auscultation. Respiratory effort normal. Cardiovascular system: S1 & S2 heard, RRR. No JVD, murmurs, rubs, gallops or clicks. No pedal edema. Gastrointestinal system: Abdomen is nondistended, soft and nontender. No organomegaly or masses felt. Normal bowel sounds heard. Central nervous system: Alert and oriented. No focal neurological deficits. Extremities: Symmetric 5 x 5 power. Skin: No rashes, lesions or ulcers Psychiatry: Judgement and insight appear normal. Mood & affect appropriate.     Data Reviewed: I have personally reviewed following labs and imaging studies  CBC:  Recent Labs Lab 05/14/16 1720 05/15/16 0457  WBC 7.6 8.0  NEUTROABS 6.1  --   HGB 13.9 13.6  HCT 39.8 39.7  MCV 86.5 87.4  PLT 161 026   Basic Metabolic Panel:  Recent Labs Lab 05/14/16 1720 05/15/16 0457  NA  127* 128*  K 3.0* 3.8  CL 88* 90*  CO2 19* 21*  GLUCOSE 65 117*  BUN 67* 66*  CREATININE 2.04* 1.69*  CALCIUM 8.7* 8.5*  MG 1.9  --    GFR: Estimated Creatinine Clearance: 33.8 mL/min (A) (by C-G formula based on SCr of 1.69 mg/dL (H)). Liver Function Tests:  Recent Labs Lab 05/14/16 1720  AST 200*  ALT 119*  ALKPHOS 83  BILITOT 0.9  PROT 6.7  ALBUMIN 3.2*   No results for input(s): LIPASE, AMYLASE in the last 168 hours. No results for input(s): AMMONIA in the last 168 hours. Coagulation Profile: No results for input(s): INR, PROTIME in the last 168 hours. Cardiac Enzymes: No results for input(s): CKTOTAL, CKMB, CKMBINDEX, TROPONINI in the last 168 hours. BNP (last 3 results) No  results for input(s): PROBNP in the last 8760 hours. HbA1C: No results for input(s): HGBA1C in the last 72 hours. CBG: No results for input(s): GLUCAP in the last 168 hours. Lipid Profile: No results for input(s): CHOL, HDL, LDLCALC, TRIG, CHOLHDL, LDLDIRECT in the last 72 hours. Thyroid Function Tests: No results for input(s): TSH, T4TOTAL, FREET4, T3FREE, THYROIDAB in the last 72 hours. Anemia Panel: No results for input(s): VITAMINB12, FOLATE, FERRITIN, TIBC, IRON, RETICCTPCT in the last 72 hours. Sepsis Labs: No results for input(s): PROCALCITON, LATICACIDVEN in the last 168 hours.  No results found for this or any previous visit (from the past 240 hour(s)).       Radiology Studies: Dg Chest Port 1 View  Result Date: 05/14/2016 CLINICAL DATA:  Weakness.  Breast cancer.  Hypertension. EXAM: PORTABLE CHEST 1 VIEW COMPARISON:  09/05/2015 FINDINGS: Airspace opacities at both lung bases and in the periphery of both upper lung zones. There is at least moderate enlargement of the cardiopericardial silhouette with atherosclerotic calcification of the aortic arch. Questionable right paratracheal adenopathy. Both lung bases are obscured. Right axillary vascular clips noted. IMPRESSION: 1. Bibasilar airspace opacities with obscuration of the hemidiaphragms. Indistinct peripheral opacities in the upper lobes. Appearance could be from a variety of conditions including resolving pulmonary edema or multilobar pneumonia. 2. Cardiomegaly. Electronically Signed   By: Van Clines M.D.   On: 05/14/2016 20:27        Scheduled Meds: . [START ON 05/16/2016] albuterol  2.5 mg Nebulization Daily  . feeding supplement  1 Container Oral TID BM  . feeding supplement (PRO-STAT SUGAR FREE 64)  30 mL Oral TID  . heparin  5,000 Units Subcutaneous Q8H  . levofloxacin (LEVAQUIN) IV  750 mg Intravenous Q48H  . mouth rinse  15 mL Mouth Rinse BID  . multivitamin with minerals  1 tablet Oral Daily  .  pantoprazole  40 mg Oral Q0600  . potassium chloride  20 mEq Oral Daily  . predniSONE  20 mg Oral BID WC   Continuous Infusions: . dextrose 5 % with KCl 20 mEq / L 20 mEq (05/15/16 1053)     LOS: 1 day    Time spent: 30 minutes.     Hosie Poisson, MD [facility] Pager [phone]  If 7PM-7AM, please contact night-coverage [email or web address] Password [email or web address] 05/15/2016, 7:09 PM

## 2016-05-16 ENCOUNTER — Inpatient Hospital Stay (HOSPITAL_COMMUNITY): Payer: BLUE CROSS/BLUE SHIELD

## 2016-05-16 DIAGNOSIS — N179 Acute kidney failure, unspecified: Principal | ICD-10-CM

## 2016-05-16 LAB — HIV ANTIBODY (ROUTINE TESTING W REFLEX): HIV Screen 4th Generation wRfx: NONREACTIVE

## 2016-05-16 LAB — OSMOLALITY: Osmolality: 283 mOsm/kg (ref 275–295)

## 2016-05-16 LAB — OSMOLALITY, URINE: OSMOLALITY UR: 230 mosm/kg — AB (ref 300–900)

## 2016-05-16 LAB — BASIC METABOLIC PANEL
ANION GAP: 11 (ref 5–15)
BUN: 48 mg/dL — AB (ref 6–20)
CALCIUM: 8.9 mg/dL (ref 8.9–10.3)
CO2: 26 mmol/L (ref 22–32)
Chloride: 88 mmol/L — ABNORMAL LOW (ref 101–111)
Creatinine, Ser: 0.9 mg/dL (ref 0.44–1.00)
GFR calc Af Amer: >60 mL/min (ref >60)
GLUCOSE: 128 mg/dL — AB (ref 65–99)
Potassium: 3.7 mmol/L (ref 3.5–5.1)
Sodium: 125 mmol/L — ABNORMAL LOW (ref 135–145)

## 2016-05-16 LAB — SODIUM, URINE, RANDOM: Sodium, Ur: 70 mmol/L

## 2016-05-16 LAB — URIC ACID: Uric Acid, Serum: 13.7 mg/dL — ABNORMAL HIGH (ref 2.3–6.6)

## 2016-05-16 LAB — CORTISOL: CORTISOL PLASMA: 11.2 ug/dL

## 2016-05-16 LAB — CK: Total CK: 4021 U/L — ABNORMAL HIGH (ref 38–234)

## 2016-05-16 MED ORDER — FUROSEMIDE 10 MG/ML IJ SOLN
60.0000 mg | Freq: Once | INTRAMUSCULAR | Status: AC
  Administered 2016-05-16: 60 mg via INTRAVENOUS
  Filled 2016-05-16: qty 6

## 2016-05-16 MED ORDER — LEVOFLOXACIN IN D5W 750 MG/150ML IV SOLN: 750.0000 mg | INTRAVENOUS | Status: DC

## 2016-05-16 NOTE — Clinical Social Work Note (Signed)
Clinical Social Work Assessment  Patient Details  Name: Danielle [name] MRN: [id number] Date of Birth: 26-Sep-1955  Date of referral:  05/16/16               Reason for consult:  Facility Placement, Discharge Planning, [employer]                Permission sought to share information with:  Tourist information centre manager, Customer service manager, Family Supports Permission granted to share information::  Yes, Verbal Permission Granted  Name::        Agency::  [facility]NF [facility]  Relationship::  Daughter, husband   Contact Information:     Housing/Transportation Living arrangements for the past 2 months:  [place] of Information:  Patient, Medical Team, Case Manager Patient Interpreter Needed:  None Criminal Activity/Legal Involvement Pertinent to Current Situation/Hospitalization:  No - Comment as needed Significant Relationships:  Adult Children, Other Family Members, Spouse Lives with:  Spouse Do you feel safe going back to the place where you live?  Yes Need for family participation in patient care:  No (Coment)  Care giving concerns:  LCSW spoke with patient at bedside. She reports she lives at home with her husband and has adult children. Reports she is unable to complete ADLs such as getting dressed and tasks at home.  Reports her husband is in and out of the house visiting others and her children have their own lives and call her but limited they can do for her during the day. Patient reports her knees are weak and she has been seeing a doctor at [facility] and states she needs an infusion and that will help her a lot.  She was suppose to go to see him this Thursday, but currently admitted.    Patient feels she cannot take care of herself at home due to limited support and progressive weakness. She is open to SNF.   Social Worker assessment / plan:  Consult completed with patient at the bedside.  Patient made aware of SW role and reason for consult and is able to communicate needs and  discharge plan.  Patient is open to referrals for SNF and beginning authorization for insurance in effort to place at [facility].  She reports she would like to stay local in [place] if possible. LCSW explained basic coverage for insurance, co-pay, and pre auth required prior to admission to SNF.  Patient voiced understanding.     Plan:  LCSW has started work up for SNF and will follow up with bed offers. Call placed to [facility] who reports they are out of network with [place], but they do have a contract.  [facility]: not in network.  [facility]: they are in network.  [facility] also called : message left and reviewing insurance to see.  Patient declines wanting to go to [facility]. Will follow up with facilities in effort to learn who is in network and have facility start insurance to see if approved.    Employment status:  Temporary, Disabled (Comment on whether or not currently receiving Disability) Insurance information:  Managed Care PT Recommendations:  [place], Home with [place], 24 Hour Supervision Information / Referral to community resources:  [place]  Patient/Family's Response to care:  Understanding  Patient/Family's Understanding of and Emotional Response to Diagnosis, Current Treatment, and Prognosis:  Patient understands deficits currently and weakness related to her prognosis and treatment while in hospital. She voices frustrations that no one has talked  to her [facility] Doctor in reference to her infusion.  Emotional Assessment Appearance:  Appears stated age Attitude/Demeanor/Rapport:  Apprehensive Affect (typically observed):  Anxious, Overwhelmed Orientation:  Oriented to Self, Oriented to Place, Oriented to  Time, Oriented to Situation Alcohol / Substance use:  Not Applicable Psych involvement (Current and /or in the community):  No (Comment)  Discharge Needs  Concerns to be addressed:  Financial / Insurance Concerns, Discharge Planning  Concerns Readmission within the last 30 days:  No Current discharge risk:  None Barriers to Discharge:  Ship broker, Continued Medical Work up   [place], LCSW 05/16/2016, 10:38 AM

## 2016-05-16 NOTE — Progress Notes (Addendum)
SATURATION QUALIFICATIONS: (This note is used to comply with regulatory documentation for home oxygen)  Patient Saturations on Room Air at Rest = 83%  Patient Saturations on Room Air while Ambulating =   Patient Saturations on 2Liters of oxygen = 94%  Please briefly explain why patient needs home oxygen:

## 2016-05-16 NOTE — Progress Notes (Signed)
PROGRESS NOTE                                                                                                                                                                                                             Patient Demographics:    Danielle [name], is a 61 y.o. female, DOB - 1955-12-21, YQM:[id number]  Admit date - 05/14/2016   Admitting Physician Orvan Falconer, MD  Outpatient Primary MD for the patient is Glo Herring, MD  LOS - 2  Chief Complaint  Patient presents with  . Nausea  . 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 Her CXR showed possible pneumonia. She was admitted for further evaluation.   Subjective:    Danielle [name] today has, No headache, No chest pain, No abdominal pain - No Nausea, No new weakness tingling or numbness, No Cough - Mild SOB.     Assessment  & Plan :     1.Nausea vomiting. Upon detailed examination and history patient says that she mostly has dysphagia and regurgitation of food rather than nausea vomiting, says been going on for one month, she never really had true emesis or abdominal pain. We will have speech evaluate. Might require modified barium. Now soft diet to continue.  2. Acute renal failure. Due to dehydration from reduced oral intake, was also on nephrotoxins which included NSAIDs, ACE inhibitor and diuretic. All have been discontinued. Renal function is back to baseline after hydration.  3. Hyponatremia. Currently fluid overloaded, hold IV fluids, Lasix, check serum osmolality, urine sodium and also reality.  4. History of steroid use. Her steroids were tapered 4 months ago. I doubt she has adrenal insufficiency at  this time, she has been placed on prednisone for now this will be continued, will check a random cortisol however might not be helpful if it's low.  5. GERD. On PPI continue.  6. Acute on chronic diastolic CHF last EF 52%. Do not think she has pneumonia, she has no cough, no infiltrate, IV fluids have been stopped, placed on IV Lasix, stop antibiotics  and monitor clinically.  7. HX of polymyositis. Under the care of Dr. Patty Sermons at [facility] rheumatology. He has been off steroids since December, currently on IVIG infusions. Stable post discharge follow with rheumatologist.  8. History of breast cancer. No acute issues.    Diet : Diet clear liquid Room service appropriate? Yes; Fluid consistency: Thin    Family Communication  :  None  Code Status :  Full  Disposition Plan  :  TBD  Consults  :  None  Procedures  :      DVT Prophylaxis  : Heparin    Lab Results  Component Value Date   PLT 154 05/15/2016    Inpatient Medications  Scheduled Meds: . albuterol  2.5 mg Nebulization Daily  . feeding supplement  1 Container Oral TID BM  . feeding supplement (PRO-STAT SUGAR FREE 64)  30 mL Oral TID  . furosemide  60 mg Intravenous Once  . heparin  5,000 Units Subcutaneous Q8H  . levofloxacin (LEVAQUIN) IV  750 mg Intravenous Q24H  . mouth rinse  15 mL Mouth Rinse BID  . multivitamin with minerals  1 tablet Oral Daily  . pantoprazole  40 mg Oral Q0600  . potassium chloride  20 mEq Oral Daily  . predniSONE  20 mg Oral BID WC   Continuous Infusions: PRN Meds:.acetaminophen, albuterol, HYDROcodone-acetaminophen, ondansetron **OR** ondansetron (ZOFRAN) IV  Antibiotics  :    Anti-infectives    Start     Dose/Rate Route Frequency Ordered Stop   05/16/16 2200  levofloxacin (LEVAQUIN) IVPB 750 mg     750 mg 100 mL/hr over 90 Minutes Intravenous Every 24 hours 05/16/16 0801     05/15/16 2100  levofloxacin (LEVAQUIN) IVPB 750 mg  Status:  Discontinued     750 mg 100 mL/hr over 90  Minutes Intravenous Every 24 hours 05/15/16 0009 05/15/16 1345   05/15/16 2100  levofloxacin (LEVAQUIN) IVPB 750 mg  Status:  Discontinued     750 mg 100 mL/hr over 90 Minutes Intravenous Every 48 hours 05/15/16 1345 05/16/16 0801   05/14/16 2115  cefTRIAXone (ROCEPHIN) 1 g in dextrose 5 % 50 mL IVPB     1 g 100 mL/hr over 30 Minutes Intravenous  Once 05/14/16 2101 05/14/16 2212   05/14/16 2115  azithromycin (ZITHROMAX) tablet 1,000 mg     1,000 mg Oral  Once 05/14/16 2101 05/14/16 2116         Objective:   Vitals:   05/15/16 1447 05/15/16 2201 05/16/16 0433 05/16/16 0840  BP: (!) 157/84 (!) 141/79 (!) 160/81   Pulse: 92 91 92   Resp: 19 20 20    Temp: 97.7 F (36.5 C) 98.4 F (36.9 C) 98.3 F (36.8 C)   TempSrc: Oral Oral Oral   SpO2: 91% 95% 96% (!) 82%  Weight:      Height:        Wt Readings from Last 3 Encounters:  05/14/16 70.9 kg (156 lb 6.4 oz)  02/14/16 78.9 kg (174 lb)  10/03/15 78.9 kg (174 lb)     Intake/Output Summary (Last 24 hours) at 05/16/16 1008 Last data filed at 05/16/16 0958  Gross per 24 hour  Intake             3260 ml  Output             2450 ml  Net              810 ml  Physical Exam  Awake Alert, Oriented X 3, No new F.N deficits, Normal affect [place].AT,PERRAL Supple Neck,No JVD, No cervical lymphadenopathy appriciated.  Symmetrical Chest wall movement, Good air movement bilaterally, few rales RRR,No Gallops,Rubs or new Murmurs, No Parasternal Heave +ve B.Sounds, Abd Soft, No tenderness, No organomegaly appriciated, No rebound - guarding or rigidity. No Cyanosis, Clubbing, 1+ edema, No new Rash or bruise       Data Review:    CBC  Recent Labs Lab 05/14/16 1720 05/15/16 0457  WBC 7.6 8.0  HGB 13.9 13.6  HCT 39.8 39.7  PLT 161 154  MCV 86.5 87.4  MCH 30.2 30.0  MCHC 34.9 34.3  RDW 16.5* 16.5*  LYMPHSABS 1.1  --   MONOABS 0.3  --   EOSABS 0.1  --   BASOSABS 0.0  --     Chemistries   Recent Labs Lab 05/14/16 1720  05/15/16 0457 05/16/16 0636  NA 127* 128* 125*  K 3.0* 3.8 3.7  CL 88* 90* 88*  CO2 19* 21* 26  GLUCOSE 65 117* 128*  BUN 67* 66* 48*  CREATININE 2.04* 1.69* 0.90  CALCIUM 8.7* 8.5* 8.9  MG 1.9  --   --   AST 200*  --   --   ALT 119*  --   --   ALKPHOS 83  --   --   BILITOT 0.9  --   --    ------------------------------------------------------------------------------------------------------------------ No results for input(s): CHOL, HDL, LDLCALC, TRIG, CHOLHDL, LDLDIRECT in the last 72 hours.  No results found for: HGBA1C ------------------------------------------------------------------------------------------------------------------ No results for input(s): TSH, T4TOTAL, T3FREE, THYROIDAB in the last 72 hours.  Invalid input(s): FREET3 ------------------------------------------------------------------------------------------------------------------ No results for input(s): VITAMINB12, FOLATE, FERRITIN, TIBC, IRON, RETICCTPCT in the last 72 hours.  Coagulation profile No results for input(s): INR, PROTIME in the last 168 hours.  No results for input(s): DDIMER in the last 72 hours.  Cardiac Enzymes No results for input(s): CKMB, TROPONINI, MYOGLOBIN in the last 168 hours.  Invalid input(s): CK ------------------------------------------------------------------------------------------------------------------    Component Value Date/Time   BNP 286.0 (H) 05/14/2016 1720    Micro Results No results found for this or any previous visit (from the past 240 hour(s)).  Radiology Reports  Dg Chest Port 1 View  Result Date: 05/16/2016 CLINICAL DATA:  Weakness nausea and vomiting for 1 week. Lower extremity swelling. EXAM: PORTABLE CHEST 1 VIEW COMPARISON:  06/09/2016 FINDINGS: Diffuse vascular and interstitial prominence, worsened. Central and basilar airspace opacities. Small to moderate pleural effusions bilaterally. Moderate cardiomegaly. IMPRESSION: The findings likely  represent congestive heart failure with interstitial and alveolar edema. Pleural effusions bilaterally. Electronically Signed   By: Andreas Newport M.D.   On: 05/16/2016 06:53   Dg Chest Port 1 View  Result Date: 05/14/2016 CLINICAL DATA:  Weakness.  Breast cancer.  Hypertension. EXAM: PORTABLE CHEST 1 VIEW COMPARISON:  09/05/2015 FINDINGS: Airspace opacities at both lung bases and in the periphery of both upper lung zones. There is at least moderate enlargement of the cardiopericardial silhouette with atherosclerotic calcification of the aortic arch. Questionable right paratracheal adenopathy. Both lung bases are obscured. Right axillary vascular clips noted. IMPRESSION: 1. Bibasilar airspace opacities with obscuration of the hemidiaphragms. Indistinct peripheral opacities in the upper lobes. Appearance could be from a variety of conditions including resolving pulmonary edema or multilobar pneumonia. 2. Cardiomegaly. Electronically Signed   By: Van Clines M.D.   On: 05/14/2016 20:27    Time Spent in minutes  [place] M.D  on 05/16/2016 at 10:08 AM  Between 7am to 7pm - Pager - [phone] ( page via [facility], text pages only, please mention full 10 digit call back number).  After 7pm go to [email or web address] - password [facility]  [facility] -  Office  [phone]

## 2016-05-16 NOTE — NC FL2 (Signed)
[place] LEVEL OF CARE SCREENING TOOL     IDENTIFICATION  Patient Name: Danielle [name] Birthdate: [birth year] Sex: female Admission Date (Current Location): 05/14/2016  [facility] and [place] Number:  [employer] and Address:  [facility] [address] [place]      Provider Number: [id number]  Attending Physician Name and Address:  Thurnell Lose, MD  Relative Name and Phone Number:       Current Level of Care: Hospital Recommended Level of Care: [place] Prior Approval Number:    Date Approved/Denied:   PASRR Number:   [id number]  Discharge Plan: SNF    Current Diagnoses: Patient Active Problem List   Diagnosis Date Noted  . Protein-calorie malnutrition, severe 05/15/2016  . Adrenal insufficiency ([place]) 05/14/2016  . CAP (community acquired pneumonia) 05/14/2016  . AKI (acute kidney injury) ([place]) 05/14/2016  . Hypokalemia 05/14/2016  . Hyponatremia 05/14/2016  . Fatty infiltration of liver 05/13/2013  . Stiffness of joint, not elsewhere classified, other specified site 01/27/2013  . Lymphedema of arm 01/27/2013  . Stress fracture of foot 06/04/2012  . 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 Pain in joint, shoulder region 04/15/2012  . Muscle tightness 04/15/2012  . Gastric ulcer 09/17/2011  . Dehydration 08/17/2011  . 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 Hypertension 08/17/2011    Orientation RESPIRATION BLADDER Height & Weight     Self, Time, Situation, Place  O2 (2L) Continent Weight: 156 lb 6.4 oz (70.9 kg) Height:  5\' 4"  (162.6 cm)  BEHAVIORAL SYMPTOMS/MOOD NEUROLOGICAL BOWEL NUTRITION STATUS    No Behaviors  stable Continent Diet (see DC summary)  Boost Breeze po TID, each supplement provides 250 kcal and 9 grams of protein   ProStat 30 ml TID (each 30 ml provides 100 kcal, 15 gr protein)    AMBULATORY STATUS COMMUNICATION OF NEEDS Skin   Limited  Assist Verbally Normal                       Personal Care Assistance Level of Assistance  Bathing, Feeding, Dressing Bathing Assistance: Maximum assistance Feeding assistance: Limited assistance Dressing Assistance: Maximum assistance     Functional Limitations Info  Sight, Hearing, Speech Sight Info: Adequate Hearing Info: Adequate Speech Info: Adequate    SPECIAL CARE FACTORS FREQUENCY  PT (By licensed PT), OT (By licensed OT)     PT Frequency: 5x OT Frequency: 5x            Contractures Contractures Info: Not present    Additional Factors Info  Code Status, Allergies Code Status Info: Full Code Allergies Info: Bactrim Sulfamethoxazole-trimethoprim, Cephalexin           Current Medications (05/16/2016):  This is the current hospital active medication list Current Facility-Administered Medications  Medication Dose Route Frequency Provider Last Rate Last Dose  . acetaminophen (TYLENOL) tablet 500 mg  500 mg Oral Q8H PRN Orvan Falconer, MD      . albuterol (PROVENTIL) (2.5 MG/3ML) 0.083% nebulizer solution 2.5 mg  2.5 mg Nebulization Q2H PRN Orvan Falconer, MD   2.5 mg at 05/15/16 1120  . albuterol (PROVENTIL) (2.5 MG/3ML) 0.083% nebulizer solution 2.5 mg  2.5 mg Nebulization Daily Hosie Poisson, MD   2.5 mg at 05/16/16 0840  . feeding supplement (BOOST / RESOURCE BREEZE) liquid 1 Container  1 Container Oral TID BM Orvan Falconer, MD   1 Container at 05/16/16 1000  .  feeding supplement (PRO-STAT SUGAR FREE 64) liquid 30 mL  30 mL Oral TID Hosie Poisson, MD   30 mL at 05/16/16 0833  . furosemide (LASIX) injection 60 mg  60 mg Intravenous Once Thurnell Lose, MD      . heparin injection 5,000 Units  5,000 Units Subcutaneous Q8H Orvan Falconer, MD   5,000 Units at 05/16/16 0604  . HYDROcodone-acetaminophen (NORCO/VICODIN) 5-325 MG per tablet 1 tablet  1 tablet Oral Q4H PRN Orvan Falconer, MD   1 tablet at 05/16/16 0604  . MEDLINE mouth rinse  15 mL Mouth Rinse BID Orvan Falconer, MD   15 mL at 05/16/16  1000  . multivitamin with minerals tablet 1 tablet  1 tablet Oral Daily Hosie Poisson, MD   1 tablet at 05/16/16 [phone]  . ondansetron (ZOFRAN) tablet 4 mg  4 mg Oral Q6H PRN Orvan Falconer, MD       Or  . ondansetron [facility]) injection 4 mg  4 mg Intravenous Q6H PRN Orvan Falconer, MD      . pantoprazole (PROTONIX) EC tablet 40 mg  40 mg Oral Q0600 Orvan Falconer, MD   40 mg at 05/16/16 0604  . potassium chloride SA (K-DUR,KLOR-CON) CR tablet 20 mEq  20 mEq Oral Daily Orvan Falconer, MD   20 mEq at 05/16/16 [phone]  . predniSONE (DELTASONE) tablet 20 mg  20 mg Oral BID WC Orvan Falconer, MD   20 mg at 05/16/16 3202   Facility-Administered Medications Ordered in Other Encounters  Medication Dose Route Frequency Provider Last Rate Last Dose  . 0.9 %  sodium chloride infusion   Intravenous Continuous Everardo All, MD 500 mL/hr at 08/17/11 1144 1,000 mL at 08/17/11 1144     Discharge Medications: Please see discharge summary for a list of discharge medications.  Relevant Imaging Results:  Relevant Lab Results:   Additional Information SSN:  [id number]  Lilly Cove, LCSW

## 2016-05-16 NOTE — Progress Notes (Signed)
LCSW updated patient on current bed offers and insurance network. Patient agreeable to begin authorization for [facility]. Updated admission coordinator to begin and accepted bed.  Patient remains uncertain if she will go to rehab, awaiting call back from daughter who is calling her doctor at [facility].  Plan: Pending insurance auth for SNF. [place] LCSW, MSW Clinical Social Work: System Wide Float Coverage for :  [phone]

## 2016-05-16 NOTE — Progress Notes (Signed)
PHARMACY NOTE:  ANTIMICROBIAL RENAL DOSAGE ADJUSTMENT  Current antimicrobial regimen includes a mismatch between antimicrobial dosage and estimated renal function.  As per policy approved by the Pharmacy & Therapeutics and Medical Executive Committees, the antimicrobial dosage will be adjusted accordingly.  Current antimicrobial dosage:  Levaquin 750mg  IV q48hrs  Indication: CAP  Renal Function: Estimated Creatinine Clearance: 63.4 mL/min (by C-G formula based on SCr of 0.9 mg/dL).    Antimicrobial dosage has been changed to:  Levaquin 750mg  IV q24hrs  Additional comments:  Renal fxn has improved since admission (SCr 2.04 > 0.9).  Continue to monitor labs, progress, renal fxn, c/s.  Thank you for allowing pharmacy to be a part of this patient's care.  Ena Dawley, [facility] 05/16/2016 8:01 AM

## 2016-05-16 NOTE — Clinical Social Work Placement (Signed)
   CLINICAL SOCIAL WORK PLACEMENT  NOTE  Date:  05/16/2016  Patient Details  Name: Danielle [name] MRN: [id number] Date of Birth: [birth year]  Clinical Social Work is seeking post-discharge placement for this patient at the [place] level of care (*CSW will initial, date and re-position this form in  chart as items are completed):  Yes   Patient/family provided with [place] Work Department's list of facilities offering this level of care within the geographic area requested by the patient (or if unable, by the patient's family).  Yes   Patient/family informed of their freedom to choose among providers that offer the needed level of care, that participate in Medicare, Medicaid or managed care program needed by the patient, have an available bed and are willing to accept the patient.  Yes   Patient/family informed of [facility]'s ownership interest in [facility] and [facility], as well as of the fact that they are under no obligation to receive care at these facilities.  PASRR submitted to EDS on 05/16/16     PASRR number received on 05/16/16     Existing PASRR number confirmed on       FL2 transmitted to all facilities in geographic area requested by pt/family on 05/16/16     FL2 transmitted to all facilities within larger geographic area on       Patient informed that his/her managed care company has contracts with or will negotiate with certain facilities, including the following:            Patient/family informed of bed offers received.  Patient chooses bed at       Physician recommends and patient chooses bed at      Patient to be transferred to   on  .  Patient to be transferred to facility by       Patient family notified on   of transfer.  Name of family member notified:        PHYSICIAN Please sign FL2     Additional Comment:    _______________________________________________ Lilly Cove, LCSW 05/16/2016, 10:50  AM

## 2016-05-17 LAB — BASIC METABOLIC PANEL
ANION GAP: 10 (ref 5–15)
BUN: 42 mg/dL — ABNORMAL HIGH (ref 6–20)
CALCIUM: 8.9 mg/dL (ref 8.9–10.3)
CO2: 28 mmol/L (ref 22–32)
Chloride: 90 mmol/L — ABNORMAL LOW (ref 101–111)
Creatinine, Ser: 0.73 mg/dL (ref 0.44–1.00)
Glucose, Bld: 94 mg/dL (ref 65–99)
Potassium: 3.9 mmol/L (ref 3.5–5.1)
Sodium: 128 mmol/L — ABNORMAL LOW (ref 135–145)

## 2016-05-17 MED ORDER — OXYCODONE HCL 5 MG PO TABS
5.0000 mg | ORAL_TABLET | ORAL | Status: DC | PRN
  Administered 2016-05-17 – 2016-05-19 (×6): 5 mg via ORAL
  Filled 2016-05-17 (×6): qty 1

## 2016-05-17 MED ORDER — POTASSIUM CHLORIDE CRYS ER 20 MEQ PO TBCR
40.0000 meq | EXTENDED_RELEASE_TABLET | Freq: Once | ORAL | Status: AC
  Administered 2016-05-17: 40 meq via ORAL
  Filled 2016-05-17: qty 2

## 2016-05-17 MED ORDER — PREDNISONE 10 MG PO TABS
10.0000 mg | ORAL_TABLET | Freq: Every day | ORAL | Status: DC
  Administered 2016-05-18 – 2016-05-19 (×2): 10 mg via ORAL
  Filled 2016-05-17 (×2): qty 1

## 2016-05-17 MED ORDER — PREDNISONE 20 MG PO TABS: 20.0000 mg | ORAL_TABLET | Freq: Every day | ORAL | Status: DC

## 2016-05-17 MED ORDER — FUROSEMIDE 10 MG/ML IJ SOLN
40.0000 mg | Freq: Two times a day (BID) | INTRAMUSCULAR | Status: AC
  Administered 2016-05-17 (×2): 40 mg via INTRAVENOUS
  Filled 2016-05-17 (×2): qty 4

## 2016-05-17 NOTE — Progress Notes (Signed)
qPhysical Therapy Treatment Patient Details Name: Danielle [name] MRN: [id number] DOB: 04-Feb-1956 Today's Date: 05/17/2016    History of Present Illness 61 y.o. 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  She has nausea, vomiting, but no diarreha.  She denied coughs, fever, or chills.  No black or bloody stools.  She has been taking NSAIDS for her chronic pain.  She has no distant travel or ill contact.  Work up in the ER showed Na of 127, K of 3.0 mE/L, normal WBC and Hb of 13,9 g per dL.  Her BUN was elevated to 67 and Cr 2.04.  CXR showed possible PNA.  Hospitalist was asked to admit her for further work up.     PT Comments    Pt received lying in bed and was agreeable to PT services with gentle encouragement. Pt limited by fatigue this date and kept verbalizing how hard it was for her to move due to weakness. PT educated pt on the importance of participating with PT and continuing to do as much as she could as this would help improve her strength. She was min guard for transfers with RW and required min assist for bed mobility. Pt's had 1 minor L knee buckle during stand pivot transfer bed > chair but she remained steady during the transfer.  Pt tolerated light therex well with no increases in pain. Educated pt to perform ankle pumps throughout the day as this would help with decreasing LE edema and she verbalized understanding. Continue to recommend SNF upon d/c.   Follow Up Recommendations  SNF     Equipment Recommendations  None recommended by PT    Recommendations for Other Services OT consult (Spoke with Dr. Karleen [place] to obtain for ADL's and energy conservation)     Precautions / Restrictions Precautions Precautions: Fall Precaution Comments: due to immobiltiy  Restrictions Weight Bearing Restrictions: No    Mobility  Bed Mobility Overal bed  mobility: Needs Assistance Bed Mobility: Supine to Sit     Supine to sit: Min assist;HOB elevated     General bed mobility comments: min A for upper body and LLE. increased time to complete task  Transfers Overall transfer level: Needs assistance Equipment used: Rolling walker (2 wheeled) Transfers: Sit to/from [facility] Sit to Stand: Min guard Stand pivot transfers: Min guard       General transfer comment: cues for proper hand placement with sit <> stand; min guard with STP due to L knee buckling during previous sessions, only noted 1 minor buckle this date.  Ambulation/Gait                 Stairs            Wheelchair Mobility    Modified Rankin (Stroke Patients Only)       Balance Overall balance assessment: Needs assistance Sitting-balance support: Bilateral upper extremity supported;Feet supported Sitting balance-Leahy Scale: Good     Standing balance support: Bilateral upper extremity supported Standing balance-Leahy Scale: Fair                              Cognition Arousal/Alertness: Awake/alert Behavior During Therapy: WFL for tasks assessed/performed Overall Cognitive Status: Within Functional Limits for tasks assessed  Exercises General Exercises - Lower Extremity Ankle Circles/Pumps: Both;10 reps;Seated Long Arc Quad: Strengthening;Both;10 reps;Seated    General Comments        Pertinent Vitals/Pain Pain Assessment: 0-10 Pain Score: 8  Pain Location: neck pain Pain Descriptors / Indicators: Sore Pain Intervention(s): Limited activity within patient's tolerance;Monitored during session;Repositioned    Home Living                      Prior Function            PT Goals (current goals can now be found in the care plan section) Acute Rehab PT Goals Patient Stated Goal: Pt wants to get stronger and be more independent.  PT Goal  Formulation: With patient Time For Goal Achievement: 05/22/16    Frequency    Min 3X/week      PT Plan      Co-evaluation             End of Session Equipment Utilized During Treatment: Gait belt;Oxygen Activity Tolerance: Patient limited by fatigue Patient left: in chair;with nursing/sitter in room Nurse Communication: Mobility status (mobility sheet left hanging in pt's room. ) PT Visit Diagnosis: Muscle weakness (generalized) (M62.81);Other abnormalities of gait and mobility (R26.89)     Time: 5329-9242 PT Time Calculation (min) (ACUTE ONLY): 13 min  Charges:  $Therapeutic Exercise: 8-22 mins                    G Codes:        Geraldine Solar PT, DPT

## 2016-05-17 NOTE — Evaluation (Signed)
Clinical/Bedside Swallow Evaluation Patient Details  Name: Danielle [name] MRN: [id number] Date of Birth: [birth year]  Today's Date: 05/17/2016 Time: SLP Start Time (ACUTE ONLY): 1435 SLP Stop Time (ACUTE ONLY): 1510 SLP Time Calculation (min) (ACUTE ONLY): 35 min  Past Medical History:  Past Medical History:  Diagnosis Date  . Borderline hyperlipidemia    managed with diet and exercise  . Breast cancer ([place]) 08/17/2011  . Cancer ([place])   . Cough    smokers cough  . Fatty infiltration of liver 05/13/2013  . Gastric ulcer 09/17/2011   not a current issue  . Hypertension   . Infiltrating ductal carcinoma of left female breast ([place]) 08/17/2011  . 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 Port catheter in place 05/13/2012  . Shortness of breath dyspnea    Past Surgical History:  Past Surgical History:  Procedure Laterality Date  . AXILLARY LYMPH NODE DISSECTION  06/04/2011   Procedure: AXILLARY LYMPH NODE DISSECTION;  Surgeon: Donato Heinz, MD;  Location: [facility];  Service: General;  Laterality: Right;  . MASTECTOMY, PARTIAL  06/04/2011   Procedure: MASTECTOMY PARTIAL;  Surgeon: Donato Heinz, MD;  Location: [facility];  Service: General;  Laterality: Right;  . MUSCLE BIOPSY Right 09/08/2015   Procedure: RIGHT THIGH MUSCLE BIOPSY;  Surgeon: Georganna Skeans, MD;  Location: [facility];  Service: General;  Laterality: Right;  . PORT-A-CATH REMOVAL Left 03/22/2014   Procedure: MINOR REMOVAL PORT-A-CATH;  Surgeon: Jamesetta So, MD;  Location: [facility];  Service: General;  Laterality: Left;  . PORTACATH PLACEMENT  07/23/2011   Procedure: INSERTION PORT-A-CATH;  Surgeon: Donato Heinz, MD;  Location: [facility];  Service: General;  Laterality: Left;  Left Subclavian  . SKIN BIOPSY  12/19/2011   Procedure: BIOPSY SKIN;  Surgeon: Donato Heinz, MD;  Location: [facility];  Service: General;  Laterality: Right;  In Minor Room  . TUBAL LIGATION  1980   HPI:  61 y.o. female with hx of  myositis and had been on high dose steroid for several months, now off, hx of HLD, PUD, hx of breast CA, s/p Portacath placement, presented to the ER not feeling well.  She has nausea, vomiting, but no diarreha.  She denied coughs, fever, or chills.  No black or bloody stools.  She has been taking NSAIDS for her chronic pain.  She has no distant travel or ill contact.  Work up in the ER showed Na of 127, K of 3.0 mE/L, normal WBC and Hb of 13,9 g per dL.  Her BUN was elevated to 67 and Cr 2.04.  CXR showed possible PNA.  Hospitalist was asked to admit her for further work up.    Chest x-ray from yesterday shows:  Diffuse vascular and interstitial prominence, worsened. Central and basilar airspace opacities. Small to moderate pleural effusions bilaterally. Moderate cardiomegaly  EPIC Care Everywhere from [facility]: CT chest/abdomen 05/09/2016: 1. Soft tissue mass in the right axilla and in the vicinity of prior lymph node dissection, concerning for recurrent breast cancer.  2. Diffuse osseous metastases and two hypoattenuating lesions in the liver concerning for metastatic disease.  3. 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 Comparison with outside CT from 08/2015 would be helpful to determine if there has been any progression. Otherwise consider a dedicated interstitial lung disease protocol CT chest with clinically appropriate.  Electronically Reviewed by: Garlan Fillers, MD, [facility] Electronically Reviewed on: 05/09/2016 4:23 PM  FEES  from 05/03/2016 at [facility]: Patient presents with mild oropharyngeal dysphagia c/b penetration with thin liquids during the swallow that did not reach the TVCs. She did not respond to this material but cued cough/throat clear and re-swallow was protective. There was no aspiration. There was no airway invasion with nectar-thick liquids. Please see Plan below.   ICD-10: R13.12; G Codes: <swallow>, Current: CI, Goal:  CI, Discharge: CI   Prognosis: Good for safe oropharyngeal swallowing.   Plan / Recommendations   1. Diet: regular diet/thin liquids 2. Aspiration precautions: small bites/sips, sit upright during PO intake, upright 30 minutes after PO intake, brush teeth with toothbrush 3 times per day 3. I recommended a trial of nectar-thick liquids and provided handout. Although I do not think NTL are needed from an airway protection standpoint, she can try them and see if they make meals more comfortable.  4. Periodic throat clear and re-swallow during meals. 5. Repeated swallows per bite, alternate liquids and solids.  6. Consider referral to GI to assess/treat possible esophageal dysphagia.   Thank you for this referral. Please call [phone] with questions.   Aleda Grana, PhD, CCC-SLP    Assessment / Plan / Recommendation Clinical Impression  Pt assessed at bedside. She reports that she recently had FEES at [facility] as an outpatient at the end of March. EPIC care everywhere reveals FEES completed on 05/03/2016 which demonstrated penetration of thins which cleared with cued cough/throat clear and did not reach true vocal folds. Pt was recommended a regular diet with thin liquids and GI follow up. A suggestion was also made to try NTL to see if it made meals more comfortable (to decrease coughing). Pt shows no overt signs or symptoms of aspiration at bedside this date, however does complain of belching and difficulty with solids. Given recent emesis, question whether Pt may have aspirated if chest x-ray looks suspicious. Recommend GI follow up as recommended by previous SLP at [facility] from FEES and Pt c/o difficulty with solids- suspicious for esophageal dysphagia related to myositis. OK to advance to D3/mech soft and thin liquids. Consider Barium Swallow (not MBSS) and/or GI consult. SLP will follow during acute setting.  SLP Visit Diagnosis: Dysphagia, oropharyngeal phase (R13.12)    Aspiration Risk   Mild aspiration risk    Diet Recommendation Dysphagia 3 (Mech soft);Thin liquid   Liquid Administration via: Cup;Straw Medication Administration: Whole meds with liquid Supervision: Patient able to self feed;Intermittent supervision to cue for compensatory strategies Compensations: Small sips/bites;Clear throat intermittently Postural Changes: Seated upright at 90 degrees;Remain upright for at least 30 minutes after po intake    Other  Recommendations Recommended Consults: Consider GI evaluation;Consider esophageal assessment Oral Care Recommendations: Oral care BID Other Recommendations: Clarify dietary restrictions   Follow up Recommendations None      Frequency and Duration min 2x/week  1 week       Prognosis Prognosis for Safe Diet Advancement: Good      Swallow Study   General Date of Onset: 05/14/16 HPI: 61 y.o. 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  She has nausea, vomiting, but no diarreha.  She denied coughs, fever, or chills.  No black or bloody stools.  She has been taking NSAIDS for her chronic pain.  She has no distant travel or ill contact.  Work up in the ER showed Na  of 127, K of 3.0 mE/L, normal WBC and Hb of 13,9 g per dL.  Her BUN was elevated to 67 and Cr 2.04.  CXR showed possible PNA.  Hospitalist was asked to admit her for further work up.  Type of Study: Bedside Swallow Evaluation Previous Swallow Assessment: FEES at [facility] 2 weeks ago Diet Prior to this Study:  (clear liquids) Temperature Spikes Noted: No Respiratory Status: Nasal cannula History of Recent Intubation: No Behavior/Cognition: Alert;Cooperative;Pleasant mood Oral Cavity Assessment: Within Functional Limits Oral Care Completed by SLP: Recent completion by staff Oral Cavity - Dentition: Dentures, top;Dentures, bottom Vision: Functional for  self-feeding Self-Feeding Abilities: Able to feed self Patient Positioning: Upright in bed Baseline Vocal Quality: Normal Volitional Cough: Weak Volitional Swallow: Able to elicit    Oral/Motor/Sensory Function Overall Oral Motor/Sensory Function: Within functional limits   Ice Chips Ice chips: Within functional limits Presentation: Spoon   Thin Liquid Thin Liquid: Within functional limits Presentation: Cup;Self Fed;Straw    Nectar Thick Nectar Thick Liquid: Not tested   Honey Thick Honey Thick Liquid: Not tested   Puree Puree: Within functional limits Presentation: Spoon   Solid   Thank you,  Genene Churn, CCC-SLP [phone]    Solid: Within functional limits Presentation: Self Fed (limited intake)        Corine Solorio 05/17/2016,3:59 PM

## 2016-05-17 NOTE — Progress Notes (Signed)
PROGRESS NOTE                                                                                                                                                                                                             Patient Demographics:    Danielle [name], is a 61 y.o. female, DOB - 04-Dec-1955, DJS:[id number]  Admit date - 05/14/2016   Admitting Physician Orvan Falconer, MD  Outpatient Primary MD for the patient is Glo Herring, MD  LOS - 3  Chief Complaint  Patient presents with  . Nausea  . Weakness       Brief Narrative  Danielle Seawright Buckis an 62 y.o.femalewith hx of myositis and had been on high dose steroid for several months, now off, hx of HLD, PUD, hx of breast CA, s/p Portacath placement, presented to the ER with generalized malaise, nause,a vomiting and some mild abdominal discomfort. Her CXR showed possible pneumonia. She was admitted for further evaluation.   Subjective:    Danielle [name] today has, No headache, No chest pain, No abdominal pain - No Nausea, No new weakness tingling or numbness, No Cough - Mild SOB.     Assessment  & Plan :     1.Nausea vomiting. Upon detailed examination and history patient says that she mostly has dysphagia and regurgitation of food rather than nausea vomiting, says been going on for one month, she never really had true emesis or abdominal pain. We will have speech evaluate. Might require modified barium. Now soft diet to continue.  2. Acute renal failure. Due to dehydration from reduced oral intake, was also on nephrotoxins which included NSAIDs, ACE inhibitor and diuretic. All have been discontinued. Renal function is back to baseline after hydration.  3. Hyponatremia. Currently fluid overloaded, hold IV fluids, Lasix, check serum osmolality, urine sodium and also reality.  4. History of steroid use. Her steroids were tapered 4 months ago. I doubt she has adrenal insufficiency at  this time, she has been placed on prednisone for now this will be continued, will check a random cortisol however might not be helpful if it's low.  5. GERD. On PPI continue.  6. Acute on chronic diastolic CHF last EF 88%. Do not think she has pneumonia, she has no cough, no infiltrate, IV fluids have been stopped, placed on IV Lasix, stopped antibiotics  and monitor clinically.  7. HX of polymyositis. Under the care of Dr. Patty Sermons at [facility] rheumatology. He has been off steroids since December, currently on IVIG infusions. Stable post discharge follow with rheumatologist.  8. History of breast cancer. No acute issues.    Diet : Diet clear liquid Room service appropriate? Yes; Fluid consistency: Thin    Family Communication  :  None  Code Status :  Full  Disposition Plan  :  SNF 1-2 days  Consults  :  None  Procedures  :      DVT Prophylaxis  : Heparin    Lab Results  Component Value Date   PLT 154 05/15/2016    Inpatient Medications  Scheduled Meds: . albuterol  2.5 mg Nebulization Daily  . feeding supplement  1 Container Oral TID BM  . feeding supplement (PRO-STAT SUGAR FREE 64)  30 mL Oral TID  . furosemide  40 mg Intravenous BID  . heparin  5,000 Units Subcutaneous Q8H  . mouth rinse  15 mL Mouth Rinse BID  . multivitamin with minerals  1 tablet Oral Daily  . pantoprazole  40 mg Oral Q0600  . potassium chloride  20 mEq Oral Daily  . potassium chloride  40 mEq Oral Once  . predniSONE  20 mg Oral BID WC   Continuous Infusions: PRN Meds:.acetaminophen, albuterol, [DISCONTINUED] ondansetron **OR** ondansetron (ZOFRAN) IV  Antibiotics  :    Anti-infectives    Start     Dose/Rate Route Frequency Ordered Stop   05/16/16 2200  levofloxacin (LEVAQUIN) IVPB 750 mg  Status:  Discontinued     750 mg 100 mL/hr over 90 Minutes Intravenous Every 24 hours 05/16/16 0801 05/16/16 1026   05/15/16 2100  levofloxacin (LEVAQUIN) IVPB 750 mg  Status:  Discontinued     750  mg 100 mL/hr over 90 Minutes Intravenous Every 24 hours 05/15/16 0009 05/15/16 1345   05/15/16 2100  levofloxacin (LEVAQUIN) IVPB 750 mg  Status:  Discontinued     750 mg 100 mL/hr over 90 Minutes Intravenous Every 48 hours 05/15/16 1345 05/16/16 0801   05/14/16 2115  cefTRIAXone (ROCEPHIN) 1 g in dextrose 5 % 50 mL IVPB     1 g 100 mL/hr over 30 Minutes Intravenous  Once 05/14/16 2101 05/14/16 2212   05/14/16 2115  azithromycin (ZITHROMAX) tablet 1,000 mg     1,000 mg Oral  Once 05/14/16 2101 05/14/16 2116         Objective:   Vitals:   05/16/16 1350 05/16/16 2007 05/16/16 2154 05/17/16 0526  BP: 126/71  (!) 147/74 (!) 142/69  Pulse: 89  93 100  Resp: 16  16 18   Temp: 98 F (36.7 C)  97.6 F (36.4 C) 98.3 F (36.8 C)  TempSrc: Oral  Oral Oral  SpO2: 95% 92% 95% 91%  Weight:      Height:        Wt Readings from Last 3 Encounters:  05/14/16 70.9 kg (156 lb 6.4 oz)  02/14/16 78.9 kg (174 lb)  10/03/15 78.9 kg (174 lb)     Intake/Output Summary (Last 24 hours) at 05/17/16 0845 Last data filed at 05/17/16 0527  Gross per 24 hour  Intake           881.67 ml  Output             2200 ml  Net         -1318.33 ml     Physical Exam  Awake  Alert, Oriented X 3, No new F.N deficits, Normal affect Danielle [name],PERRAL Supple Neck,No JVD, No cervical lymphadenopathy appriciated.  Symmetrical Chest wall movement, Good air movement bilaterally, few rales RRR,No Gallops,Rubs or new Murmurs, No Parasternal Heave +ve B.Sounds, Abd Soft, No tenderness, No organomegaly appriciated, No rebound - guarding or rigidity. No Cyanosis, Clubbing, 1+ edema, No new Rash or bruise       Data Review:    CBC  Recent Labs Lab 05/14/16 1720 05/15/16 0457  WBC 7.6 8.0  HGB 13.9 13.6  HCT 39.8 39.7  PLT 161 154  MCV 86.5 87.4  MCH 30.2 30.0  MCHC 34.9 34.3  RDW 16.5* 16.5*  LYMPHSABS 1.1  --   MONOABS 0.3  --   EOSABS 0.1  --   BASOSABS 0.0  --     Chemistries   Recent Labs Lab  05/14/16 1720 05/15/16 0457 05/16/16 0636 05/17/16 0459  NA 127* 128* 125* 128*  K 3.0* 3.8 3.7 3.9  CL 88* 90* 88* 90*  CO2 19* 21* 26 28  GLUCOSE 65 117* 128* 94  BUN 67* 66* 48* 42*  CREATININE 2.04* 1.69* 0.90 0.73  CALCIUM 8.7* 8.5* 8.9 8.9  MG 1.9  --   --   --   AST 200*  --   --   --   ALT 119*  --   --   --   ALKPHOS 83  --   --   --   BILITOT 0.9  --   --   --    ------------------------------------------------------------------------------------------------------------------ No results for input(s): CHOL, HDL, LDLCALC, TRIG, CHOLHDL, LDLDIRECT in the last 72 hours.  No results found for: HGBA1C ------------------------------------------------------------------------------------------------------------------ No results for input(s): TSH, T4TOTAL, T3FREE, THYROIDAB in the last 72 hours.  Invalid input(s): FREET3 ------------------------------------------------------------------------------------------------------------------ No results for input(s): VITAMINB12, FOLATE, FERRITIN, TIBC, IRON, RETICCTPCT in the last 72 hours.  Coagulation profile No results for input(s): INR, PROTIME in the last 168 hours.  No results for input(s): DDIMER in the last 72 hours.  Cardiac Enzymes No results for input(s): CKMB, TROPONINI, MYOGLOBIN in the last 168 hours.  Invalid input(s): CK ------------------------------------------------------------------------------------------------------------------    Component Value Date/Time   BNP 286.0 (H) 05/14/2016 1720    Micro Results No results found for this or any previous visit (from the past 240 hour(s)).  Radiology Reports  Dg Chest Port 1 View  Result Date: 05/16/2016 CLINICAL DATA:  Weakness nausea and vomiting for 1 week. Lower extremity swelling. EXAM: PORTABLE CHEST 1 VIEW COMPARISON:  06/09/2016 FINDINGS: Diffuse vascular and interstitial prominence, worsened. Central and basilar airspace opacities. Small to moderate  pleural effusions bilaterally. Moderate cardiomegaly. IMPRESSION: The findings likely represent congestive heart failure with interstitial and alveolar edema. Pleural effusions bilaterally. Electronically Signed   By: Andreas Newport M.D.   On: 05/16/2016 06:53   Dg Chest Port 1 View  Result Date: 05/14/2016 CLINICAL DATA:  Weakness.  Breast cancer.  Hypertension. EXAM: PORTABLE CHEST 1 VIEW COMPARISON:  09/05/2015 FINDINGS: Airspace opacities at both lung bases and in the periphery of both upper lung zones. There is at least moderate enlargement of the cardiopericardial silhouette with atherosclerotic calcification of the aortic arch. Questionable right paratracheal adenopathy. Both lung bases are obscured. Right axillary vascular clips noted. IMPRESSION: 1. Bibasilar airspace opacities with obscuration of the hemidiaphragms. Indistinct peripheral opacities in the upper lobes. Appearance could be from a variety of conditions including resolving pulmonary edema or multilobar pneumonia. 2. Cardiomegaly. Electronically Signed   By: Thayer Jew  Janeece Fitting M.D.   On: 05/14/2016 20:27    Time Spent in minutes  30   SINGH,PRASHANT K M.D on 05/17/2016 at 8:45 AM  Between 7am to 7pm - Pager - [phone] ( page via [facility], text pages only, please mention full 10 digit call back number).  After 7pm go to [email or web address] - password [facility]  [facility] -  Office  [phone]

## 2016-05-18 ENCOUNTER — Inpatient Hospital Stay (HOSPITAL_COMMUNITY): Payer: BLUE CROSS/BLUE SHIELD

## 2016-05-18 LAB — BASIC METABOLIC PANEL
Anion gap: 11 (ref 5–15)
BUN: 40 mg/dL — AB (ref 6–20)
CO2: 28 mmol/L (ref 22–32)
Calcium: 9 mg/dL (ref 8.9–10.3)
Chloride: 91 mmol/L — ABNORMAL LOW (ref 101–111)
Creatinine, Ser: 0.74 mg/dL (ref 0.44–1.00)
GFR calc Af Amer: >60 mL/min (ref >60)
GFR calc non Af Amer: >60 mL/min (ref >60)
GLUCOSE: 84 mg/dL (ref 65–99)
POTASSIUM: 3.8 mmol/L (ref 3.5–5.1)
Sodium: 130 mmol/L — ABNORMAL LOW (ref 135–145)

## 2016-05-18 MED ORDER — POTASSIUM CHLORIDE CRYS ER 20 MEQ PO TBCR
40.0000 meq | EXTENDED_RELEASE_TABLET | Freq: Once | ORAL | Status: AC
  Administered 2016-05-18: 40 meq via ORAL
  Filled 2016-05-18: qty 2

## 2016-05-18 MED ORDER — FUROSEMIDE 10 MG/ML IJ SOLN
40.0000 mg | Freq: Two times a day (BID) | INTRAMUSCULAR | Status: DC
  Administered 2016-05-18 – 2016-05-19 (×2): 40 mg via INTRAVENOUS
  Filled 2016-05-18 (×2): qty 4

## 2016-05-18 MED ORDER — FUROSEMIDE 10 MG/ML IJ SOLN
60.0000 mg | Freq: Once | INTRAMUSCULAR | Status: AC
  Administered 2016-05-18: 60 mg via INTRAVENOUS
  Filled 2016-05-18: qty 6

## 2016-05-18 NOTE — Progress Notes (Signed)
PROGRESS NOTE                                                                                                                                                                                                             Patient Demographics:    Danielle [name], is a 61 y.o. female, DOB - [birth year], MGN:[id number]  Admit date - 05/14/2016   Admitting Physician Orvan Falconer, MD  Outpatient Primary MD for the patient is Glo Herring, MD  LOS - 4  Chief Complaint  Patient presents with  . Nausea  . Weakness       Brief Narrative  Danielle [name] an 61 y.o.femalewith hx of myositis and had been on high dose steroid for several months, now off, hx of HLD, PUD, hx of breast CA, s/p Portacath placement, presented to the ER with generalized malaise, nause,a vomiting and some mild abdominal discomfort. Her CXR showed possible pneumonia. She was admitted for further evaluation.   Subjective:    Danielle Schoff today has, No headache, No chest pain, No abdominal pain - No Nausea, No new weakness tingling or numbness, No Cough - Mild SOB.     Assessment  & Plan :     1.Nausea vomiting. Upon detailed examination and history patient says that she mostly has dysphagia and regurgitation of food rather than nausea vomiting, says been going on for one month, she never really had true emesis or abdominal pain. Seen by speech, due for barium swallow . Now soft diet to continue.  2. Acute renal failure. Due to dehydration from reduced oral intake, was also on nephrotoxins which included NSAIDs, ACE inhibitor and diuretic. All have been discontinued. Renal function is back to baseline after hydration.  3. Hyponatremia. Currently fluid overloaded, hold IV fluids, Lasix, check serum osmolality, urine sodium and also reality.  4. History of steroid use. Her steroids were tapered 4 months ago. I doubt she has adrenal insufficiency at this time, she has  been placed on prednisone for now this will be continued with gentle taper, random cortisol was 11.  5. GERD. On PPI continue.  6. Acute on chronic diastolic CHF last EF 91%. Do not think she has pneumonia, she has no cough, no infiltrate, IV fluids have been stopped, continue on IV Lasix, stopped antibiotics and monitor clinically.  7. HX of  polymyositis. Under the care of Dr. Patty Sermons at [facility] rheumatology. He has been off steroids since December, currently on IVIG infusions. Stable post discharge follow with rheumatologist.  8. Mildly elevated Liver Enzymes - chronic, check RUQ [place] and trend.  9. History of breast cancer. No acute issues.    Diet : Diet NPO time specified Except for: Sips with Meds    Family Communication  :  None  Code Status :  Full  Disposition Plan  :  SNF in am if stable  Consults  :  None  Procedures  :    RUQ [place]   Barium Swallow  DVT Prophylaxis  : Heparin    Lab Results  Component Value Date   PLT 154 05/15/2016    Inpatient Medications  Scheduled Meds: . albuterol  2.5 mg Nebulization Daily  . feeding supplement  1 Container Oral TID BM  . feeding supplement (PRO-STAT SUGAR FREE 64)  30 mL Oral TID  . furosemide  40 mg Intravenous BID  . heparin  5,000 Units Subcutaneous Q8H  . mouth rinse  15 mL Mouth Rinse BID  . multivitamin with minerals  1 tablet Oral Daily  . pantoprazole  40 mg Oral Q0600  . predniSONE  10 mg Oral Q breakfast   Continuous Infusions: PRN Meds:.acetaminophen, albuterol, [DISCONTINUED] ondansetron **OR** ondansetron (ZOFRAN) IV, oxyCODONE  Antibiotics  :    Anti-infectives    Start     Dose/Rate Route Frequency Ordered Stop   05/16/16 2200  levofloxacin (LEVAQUIN) IVPB 750 mg  Status:  Discontinued     750 mg 100 mL/hr over 90 Minutes Intravenous Every 24 hours 05/16/16 0801 05/16/16 1026   05/15/16 2100  levofloxacin (LEVAQUIN) IVPB 750 mg  Status:  Discontinued     750 mg 100 mL/hr over 90 Minutes  Intravenous Every 24 hours 05/15/16 0009 05/15/16 1345   05/15/16 2100  levofloxacin (LEVAQUIN) IVPB 750 mg  Status:  Discontinued     750 mg 100 mL/hr over 90 Minutes Intravenous Every 48 hours 05/15/16 1345 05/16/16 0801   05/14/16 2115  cefTRIAXone (ROCEPHIN) 1 g in dextrose 5 % 50 mL IVPB     1 g 100 mL/hr over 30 Minutes Intravenous  Once 05/14/16 2101 05/14/16 2212   05/14/16 2115  azithromycin (ZITHROMAX) tablet 1,000 mg     1,000 mg Oral  Once 05/14/16 2101 05/14/16 2116         Objective:   Vitals:   05/17/16 2200 05/18/16 0546 05/18/16 0739 05/18/16 0843  BP: (!) 110/53 (!) 100/50    Pulse: 97 98    Resp: 18 18    Temp: 97.6 F (36.4 C) 97.6 F (36.4 C)    TempSrc: Oral Oral    SpO2: 93% 93% 94% (!) 88%  Weight:      Height:        Wt Readings from Last 3 Encounters:  05/14/16 70.9 kg (156 lb 6.4 oz)  02/14/16 78.9 kg (174 lb)  10/03/15 78.9 kg (174 lb)     Intake/Output Summary (Last 24 hours) at 05/18/16 1108 Last data filed at 05/18/16 0230  Gross per 24 hour  Intake              480 ml  Output             1300 ml  Net             -820 ml     Physical Exam  Awake  Alert, Oriented X 3, No new F.N deficits, Normal affect [place].AT,PERRAL Supple Neck,No JVD, No cervical lymphadenopathy appriciated.  Symmetrical Chest wall movement, Good air movement bilaterally, few rales RRR,No Gallops,Rubs or new Murmurs, No Parasternal Heave +ve B.Sounds, Abd Soft, No tenderness, No organomegaly appriciated, No rebound - guarding or rigidity. No Cyanosis, Clubbing, 1+ edema, No new Rash or bruise       Data Review:    CBC  Recent Labs Lab 05/14/16 1720 05/15/16 0457  WBC 7.6 8.0  HGB 13.9 13.6  HCT 39.8 39.7  PLT 161 154  MCV 86.5 87.4  MCH 30.2 30.0  MCHC 34.9 34.3  RDW 16.5* 16.5*  LYMPHSABS 1.1  --   MONOABS 0.3  --   EOSABS 0.1  --   BASOSABS 0.0  --     Chemistries   Recent Labs Lab 05/14/16 1720 05/15/16 0457 05/16/16 0636  05/17/16 0459 05/18/16 0444  NA 127* 128* 125* 128* 130*  K 3.0* 3.8 3.7 3.9 3.8  CL 88* 90* 88* 90* 91*  CO2 19* 21* 26 28 28   GLUCOSE 65 117* 128* 94 84  BUN 67* 66* 48* 42* 40*  CREATININE 2.04* 1.69* 0.90 0.73 0.74  CALCIUM 8.7* 8.5* 8.9 8.9 9.0  MG 1.9  --   --   --   --   AST 200*  --   --   --   --   ALT 119*  --   --   --   --   ALKPHOS 83  --   --   --   --   BILITOT 0.9  --   --   --   --    ------------------------------------------------------------------------------------------------------------------ No results for input(s): CHOL, HDL, LDLCALC, TRIG, CHOLHDL, LDLDIRECT in the last 72 hours.  No results found for: HGBA1C ------------------------------------------------------------------------------------------------------------------ No results for input(s): TSH, T4TOTAL, T3FREE, THYROIDAB in the last 72 hours.  Invalid input(s): FREET3 ------------------------------------------------------------------------------------------------------------------ No results for input(s): VITAMINB12, FOLATE, FERRITIN, TIBC, IRON, RETICCTPCT in the last 72 hours.  Coagulation profile No results for input(s): INR, PROTIME in the last 168 hours.  No results for input(s): DDIMER in the last 72 hours.  Cardiac Enzymes No results for input(s): CKMB, TROPONINI, MYOGLOBIN in the last 168 hours.  Invalid input(s): CK ------------------------------------------------------------------------------------------------------------------    Component Value Date/Time   BNP 286.0 (H) 05/14/2016 1720    Micro Results No results found for this or any previous visit (from the past 240 hour(s)).  Radiology Reports  Dg Chest Port 1 View  Result Date: 05/16/2016 CLINICAL DATA:  Weakness nausea and vomiting for 1 week. Lower extremity swelling. EXAM: PORTABLE CHEST 1 VIEW COMPARISON:  06/09/2016 FINDINGS: Diffuse vascular and interstitial prominence, worsened. Central and basilar airspace  opacities. Small to moderate pleural effusions bilaterally. Moderate cardiomegaly. IMPRESSION: The findings likely represent congestive heart failure with interstitial and alveolar edema. Pleural effusions bilaterally. Electronically Signed   By: Andreas Newport M.D.   On: 05/16/2016 06:53   Dg Chest Port 1 View  Result Date: 05/14/2016 CLINICAL DATA:  Weakness.  Breast cancer.  Hypertension. EXAM: PORTABLE CHEST 1 VIEW COMPARISON:  09/05/2015 FINDINGS: Airspace opacities at both lung bases and in the periphery of both upper lung zones. There is at least moderate enlargement of the cardiopericardial silhouette with atherosclerotic calcification of the aortic arch. Questionable right paratracheal adenopathy. Both lung bases are obscured. Right axillary vascular clips noted. IMPRESSION: 1. Bibasilar airspace opacities with obscuration of the hemidiaphragms. Indistinct peripheral opacities in the upper  lobes. Appearance could be from a variety of conditions including resolving pulmonary edema or multilobar pneumonia. 2. Cardiomegaly. Electronically Signed   By: Van Clines M.D.   On: 05/14/2016 20:27    Time Spent in minutes  30   Lala Lund M.D on 05/18/2016 at 11:08 AM  Between 7am to 7pm - Pager - [phone] ( page via [facility], text pages only, please mention full 10 digit call back number).  After 7pm go to [email or web address] - password [facility]  [facility] -  Office  [phone]

## 2016-05-18 NOTE — Care Management Note (Signed)
Case Management Note  Patient Details  Name: Danielle [name] MRN: [id number] Date of Birth: 27-Aug-1955  Expected Discharge Date:        05/19/2016          Expected Discharge Plan:  [place]  In-House Referral:  Clinical Social Work  Discharge planning Services  CM Consult  Post Acute Care Choice:    Choice offered to:     DME Arranged:    DME Agency:     HH Arranged:    [place] Agency:     Status of Service:  Completed, signed off  If discussed at [employer], dates discussed:    Additional Comments: Patient will discharge to SNF, possibly tomorrow (05/19/2016) if medically ready. No other CM needs. CSW making arrangements.   [name], Chauncey Reading, RN 05/18/2016, 12:41 PM

## 2016-05-18 NOTE — Progress Notes (Signed)
LCSW following for disposition:  SNF: [place].   SNF was able to obtain authorization for SNF.  Patient can transfer to SNF once cleared medically. Patient is able to return on the weekend if medically stable.  LCSW will continue to follow and assist with discharge planning and needs.  Lane Hacker, MSW Clinical Social Work: [employer] Coverage for :  [phone]

## 2016-05-19 LAB — COMPREHENSIVE METABOLIC PANEL
ALBUMIN: 3.1 g/dL — AB (ref 3.5–5.0)
ALK PHOS: 70 U/L (ref 38–126)
ALT: 95 U/L — AB (ref 14–54)
AST: 144 U/L — AB (ref 15–41)
Anion gap: 10 (ref 5–15)
BUN: 41 mg/dL — ABNORMAL HIGH (ref 6–20)
CALCIUM: 9 mg/dL (ref 8.9–10.3)
CHLORIDE: 93 mmol/L — AB (ref 101–111)
CO2: 30 mmol/L (ref 22–32)
CREATININE: 0.78 mg/dL (ref 0.44–1.00)
GFR calc Af Amer: >60 mL/min (ref >60)
GFR calc non Af Amer: >60 mL/min (ref >60)
GLUCOSE: 97 mg/dL (ref 65–99)
Potassium: 3.9 mmol/L (ref 3.5–5.1)
SODIUM: 133 mmol/L — AB (ref 135–145)
Total Bilirubin: 0.7 mg/dL (ref 0.3–1.2)
Total Protein: 6.2 g/dL — ABNORMAL LOW (ref 6.5–8.1)

## 2016-05-19 MED ORDER — METOPROLOL TARTRATE 25 MG PO TABS
25.0000 mg | ORAL_TABLET | Freq: Two times a day (BID) | ORAL | Status: DC
  Administered 2016-05-19: 25 mg via ORAL
  Filled 2016-05-19: qty 1

## 2016-05-19 MED ORDER — HYDROCODONE-ACETAMINOPHEN 5-325 MG PO TABS: 1.0000 | ORAL_TABLET | Freq: Four times a day (QID) | ORAL | 0 refills | Status: DC | PRN

## 2016-05-19 MED ORDER — METOPROLOL TARTRATE 25 MG PO TABS: 25.0000 mg | ORAL_TABLET | Freq: Two times a day (BID) | ORAL | Status: DC

## 2016-05-19 MED ORDER — FUROSEMIDE 40 MG PO TABS: 40.0000 mg | ORAL_TABLET | Freq: Every day | ORAL | Status: DC

## 2016-05-19 MED ORDER — POTASSIUM CHLORIDE ER 20 MEQ PO TBCR: 20.0000 meq | EXTENDED_RELEASE_TABLET | Freq: Every day | ORAL | Status: DC

## 2016-05-19 NOTE — Progress Notes (Signed)
Patient transferring to [facility].  Report called and given to [place], Therapist, sports. IV removed - WNL.  Awaiting on EMS for transport.  Patient in NAD at this time

## 2016-05-19 NOTE — Discharge Instructions (Signed)
Follow with Primary MD Glo Herring, MD in 7 days   Get CBC, CMP, 2 view Chest X ray checked  by Primary MD or SNF MD in 5-7 days ( we routinely change or add medications that can affect your baseline labs and fluid status, therefore we recommend that you get the mentioned basic workup next visit with your PCP, your PCP may decide not to get them or add new tests based on their clinical decision)  Activity: As tolerated with Full fall precautions use walker/cane & assistance as needed  Disposition SNF  Diet:   DIET DYS 3 with feeding assistance and aspiration precautions.  For Heart failure patients - Check your Weight same time everyday, if you gain over 2 pounds, or you develop in leg swelling, experience more shortness of breath or chest pain, call your Primary MD immediately. Follow Cardiac Low Salt Diet and 1.5 lit/day fluid restriction.  On your next visit with your primary care physician please Get Medicines reviewed and adjusted.  Please request your Prim.MD to go over all Hospital Tests and Procedure/Radiological results at the follow up, please get all Hospital records sent to your Prim MD by signing hospital release before you go home.  If you experience worsening of your admission symptoms, develop shortness of breath, life threatening emergency, suicidal or homicidal thoughts you must seek medical attention immediately by calling 911 or calling your MD immediately  if symptoms less severe.  You Must read complete instructions/literature along with all the possible adverse reactions/side effects for all the Medicines you take and that have been prescribed to you. Take any new Medicines after you have completely understood and accpet all the possible adverse reactions/side effects.   Do not drive, operate heavy machinery, perform activities at heights, swimming or participation in water activities or provide baby sitting services if your were admitted for syncope or siezures until  you have seen by Primary MD or a Neurologist and advised to do so again.  Do not drive when taking Pain medications.    Do not take more than prescribed Pain, Sleep and Anxiety Medications  Special Instructions: If you have smoked or chewed Tobacco  in the last 2 yrs please stop smoking, stop any regular Alcohol  and or any Recreational drug use.  Wear Seat belts while driving.   Please note  You were cared for by a hospitalist during your hospital stay. If you have any questions about your discharge medications or the care you received while you were in the hospital after you are discharged, you can call the unit and asked to speak with the hospitalist on call if the hospitalist that took care of you is not available. Once you are discharged, your primary care physician will handle any further medical issues. Please note that NO REFILLS for any discharge medications will be authorized once you are discharged, as it is imperative that you return to your primary care physician (or establish a relationship with a primary care physician if you do not have one) for your aftercare needs so that they can reassess your need for medications and monitor your lab values.

## 2016-05-19 NOTE — Progress Notes (Signed)
Clinical Social Worker facilitated patient discharge including contacting patient family and facility to confirm patient discharge plans.  Clinical information faxed to facility and family agreeable with plan.  Danielle [name] will arranged [facility] transport for patient to [facility] .  RN Meagan to call [phone] (pt will be in room 103) for report prior to discharge.  Clinical Social Worker will sign off for now as social work intervention is no longer needed. Please consult [place] again if new need arises.  Danielle [name], MSW, [place]

## 2016-05-19 NOTE — Discharge Summary (Signed)
Danielle [name]:[id number] DOB: 1955/05/14 DOA: 05/14/2016  PCP: Glo Herring, MD  Admit date: 05/14/2016  Discharge date: 05/19/2016  Admitted From: Home  Disposition:  SNF   Recommendations for Outpatient Follow-up:   Follow up with PCP in 1-2 weeks  PCP Please obtain BMP/CBC, 2 view CXR in 1week,  (see Discharge instructions)   PCP Please follow up on the following pending results: Monitor BMP closely   Home Health: None   Equipment/Devices: None  Consultations: None Discharge Condition: Full   CODE STATUS: Stable   Diet Recommendation: DIET DYS 3     Chief Complaint  Patient presents with  . Nausea  . Weakness     Brief history of present illness from the day of admission and additional interim summary    Danielle [name] an 61 y.o.femalewith hx of myositis and had been on high dose steroid for several months, now off, hx of HLD, PUD, hx of breast CA, s/p Portacath placement, presented to the ER with generalized malaise, nause,a vomiting and some mild abdominal discomfort. Her CXR showed possible pneumonia. She was admitted for further evaluation.                                                                 Hospital Course   1.Nausea vomiting. Upon detailed examination and history patient says that she mostly has dysphagia and regurgitation of food rather than nausea vomiting, says been going on for a few months, and had workup at [facility] suggesting the same according to the patient, barium swallow was done here confirming some laryngeal penetration, she was seen by speech as well. Currently on dysphagia 3 diet. Will require outpatient ENT follow-up within a week. Remains high risk for aspiration. According to the patient this was found at [facility] few weeks ago as well and they were thinking about  outpatient follow-up as well.  2. Acute renal failure. Due to dehydration from reduced oral intake, was also on nephrotoxins which included NSAIDs, ACE inhibitor and diuretic. All have been discontinued. Renal function is back to baseline after hydration. Monitor BMP at SNF.  3. Hyponatremia. Due to fluid overload improved after diuresis.  4. History of steroid use. Her steroids were tapered 4 months ago, random cortisol was 11. Acute issues.  5. GERD. On PPI continue.  6. Acute on chronic diastolic CHF last EF 81%. Do not think she has pneumonia, she has no cough, no infiltrate has been adequately diuresed with IV Lasix to good effect, still requires 2 L nasal Oxygen but otherwise symptom-free, placed on oral Lasix, monitor BMP and Lasix dose closely at SNF.  7. HX of polymyositis. Under the care of Dr. Patty Sermons at [facility] rheumatology. He has been off steroids since December, currently on IVIG infusions. Stable post discharge follow  with rheumatologist.  8. Mildly elevated Liver Enzymes - somewhat chronic, possible nodularity on the right upper quadrant ultrasound, needs outpatient GI follow-up she is symptom-free.  9. History of breast cancer. No acute issues.     Discharge diagnosis     Principal Problem:   AKI (acute kidney injury) ([place]) Active Problems:   Adrenal insufficiency (HCC)   CAP (community acquired pneumonia)   Hypokalemia   Hyponatremia   Protein-calorie malnutrition, severe    Discharge instructions    Discharge Instructions    Discharge instructions    Complete by:  As directed    Follow with Primary MD Glo Herring, MD in 7 days   Get CBC, CMP, 2 view Chest X ray checked  by Primary MD or SNF MD in 5-7 days ( we routinely change or add medications that can affect your baseline labs and fluid status, therefore we recommend that you get the mentioned basic workup next visit with your PCP, your PCP may decide not to get them or add new tests based  on their clinical decision)  Activity: As tolerated with Full fall precautions use walker/cane & assistance as needed  Disposition SNF  Diet:   DIET DYS 3 with feeding assistance and aspiration precautions.  For Heart failure patients - Check your Weight same time everyday, if you gain over 2 pounds, or you develop in leg swelling, experience more shortness of breath or chest pain, call your Primary MD immediately. Follow Cardiac Low Salt Diet and 1.5 lit/day fluid restriction.  On your next visit with your primary care physician please Get Medicines reviewed and adjusted.  Please request your Prim.MD to go over all Hospital Tests and Procedure/Radiological results at the follow up, please get all Hospital records sent to your Prim MD by signing hospital release before you go home.  If you experience worsening of your admission symptoms, develop shortness of breath, life threatening emergency, suicidal or homicidal thoughts you must seek medical attention immediately by calling 911 or calling your MD immediately  if symptoms less severe.  You Must read complete instructions/literature along with all the possible adverse reactions/side effects for all the Medicines you take and that have been prescribed to you. Take any new Medicines after you have completely understood and accpet all the possible adverse reactions/side effects.   Do not drive, operate heavy machinery, perform activities at heights, swimming or participation in water activities or provide baby sitting services if your were admitted for syncope or siezures until you have seen by Primary MD or a Neurologist and advised to do so again.  Do not drive when taking Pain medications.    Do not take more than prescribed Pain, Sleep and Anxiety Medications  Special Instructions: If you have smoked or chewed Tobacco  in the last 2 yrs please stop smoking, stop any regular Alcohol  and or any Recreational drug use.  Wear Seat belts  while driving.   Please note  You were cared for by a hospitalist during your hospital stay. If you have any questions about your discharge medications or the care you received while you were in the hospital after you are discharged, you can call the unit and asked to speak with the hospitalist on call if the hospitalist that took care of you is not available. Once you are discharged, your primary care physician will handle any further medical issues. Please note that NO REFILLS for any discharge medications will be authorized once you are discharged, as it  is imperative that you return to your primary care physician (or establish a relationship with a primary care physician if you do not have one) for your aftercare needs so that they can reassess your need for medications and monitor your lab values.   Increase activity slowly    Complete by:  As directed       Discharge Medications   Allergies as of 05/19/2016      Reactions   Bactrim [sulfamethoxazole-trimethoprim] Nausea Only, Other (See Comments)   Fever and chills along with the nausea.   Cephalexin Palpitations   Reaction:Heart flutter      Medication List    TAKE these medications   acetaminophen 500 MG tablet Commonly known as:  TYLENOL Take 500 mg by mouth every 8 (eight) hours as needed for mild pain or moderate pain.   albuterol 0.63 MG/3ML nebulizer solution Commonly known as:  ACCUNEB Take 1 ampule by nebulization every 6 (six) hours as needed for wheezing or shortness of breath.   furosemide 40 MG tablet Commonly known as:  LASIX Take 1 tablet (40 mg total) by mouth daily.   HYDROcodone-acetaminophen 5-325 MG tablet Commonly known as:  NORCO/VICODIN Take 1 tablet by mouth every 6 (six) hours as needed. What changed:  when to take this   ibuprofen 200 MG tablet Commonly known as:  ADVIL,MOTRIN Take 200 mg by mouth every 6 (six) hours as needed for mild pain or moderate pain.   lisinopril-hydrochlorothiazide  20-25 MG tablet Commonly known as:  PRINZIDE,ZESTORETIC Take 2 tablets by mouth daily.   meloxicam 15 MG tablet Commonly known as:  MOBIC Take 1 tablet (15 mg total) by mouth daily.   methocarbamol 500 MG tablet Commonly known as:  ROBAXIN Take 1 tablet (500 mg total) by mouth 3 (three) times daily.   metoprolol tartrate 25 MG tablet Commonly known as:  LOPRESSOR Take 1 tablet (25 mg total) by mouth 2 (two) times daily.   Potassium Chloride ER 20 MEQ Tbcr Take 20 mEq by mouth daily.            Durable Medical Equipment        Start     Ordered   05/16/16 1020  For home use only DME oxygen  Once    Question Answer Comment  Mode or (Route) Nasal cannula   Liters per Minute 2   Frequency Continuous (stationary and portable oxygen unit needed)   Oxygen conserving device Yes   Oxygen delivery system Gas      05/16/16 1019       Contact information for follow-up providers    Glo Herring, MD. Schedule an appointment as soon as possible for a visit in 1 week(s).   Specialty:  Internal Medicine Why:  and your rheumatologist within a week Contact information: [address] [place] [address] [phone]        DR. SU WOOI TEOH. Schedule an appointment as soon as possible for a visit in 1 week(s).   Specialty:  Otolaryngology Why:  Oropharyngeal dysphagia Contact information: [place] [phone] Additional information: Satellite Office - live on [facility]       Hildred Laser, MD. Schedule an appointment as soon as possible for a visit in 1 week(s).   Specialty:  Gastroenterology Contact information: [address], [address] [place] [address] [phone]            Contact information for after-discharge care  [facility] Follow up.   Specialty:  [place] information: [address]  [place] [phone]                  Major procedures and Radiology Reports - PLEASE review detailed and final reports thoroughly  -        Dg Chest 2 View  Result Date: 05/18/2016 CLINICAL DATA:  Pneumonia. EXAM: CHEST  2 VIEW COMPARISON:  05/16/2016. FINDINGS: BILATERAL pulmonary opacities are improved. Increasing lung volumes. Cardiomegaly. Less edema. IMPRESSION: Improved aeration could represent clearing pneumonia or decreasing CHF. Electronically Signed   By: Staci Righter M.D.   On: 05/18/2016 11:24   Dg Esophagus  Result Date: 05/18/2016 CLINICAL DATA:  Dysphagia EXAM: ESOPHOGRAM/BARIUM SWALLOW TECHNIQUE: Single contrast examination was performed using thin barium. Patient swallowed a 12.5 mm diameter barium tablet. FLUOROSCOPY TIME:  Fluoroscopy Time:  1 minutes 42 seconds Radiation Exposure Index (if provided by the fluoroscopic device): 28.3 mGy Number of Acquired Spot Images: Multiple screen captures during fluoroscopy COMPARISON:  None FINDINGS: Esophageal distention: Normal without obvious mass or stricture Intraluminal filling defects:  None identified 12.5 mm barium tablet: Passed from oral cavity to stomach without delay/obstruction. Motility:  Diffuse age-related esophageal dysmotility Mucosa:  No definite areas of irregularity or ulceration. Hypopharynx/cervical esophagus: Did not witness laryngeal penetration or aspiration directly during swallows. However, patient developed an episode of coughing during the exam and subsequent imaging demonstrated aspiration of a small amount of contrast below the vocal cords in the proximal trachea. Hiatal hernia:  Not identified GE reflux:  Not identified Other:  N/A IMPRESSION: Laryngeal penetration and aspiration. Esophageal dysmotility. Electronically Signed   By: Lavonia Dana M.D.   On: 05/18/2016 13:25   Dg Chest Port 1 View  Result Date: 05/16/2016 CLINICAL DATA:  Weakness nausea and vomiting for 1 week. Lower extremity swelling.  EXAM: PORTABLE CHEST 1 VIEW COMPARISON:  06/09/2016 FINDINGS: Diffuse vascular and interstitial prominence, worsened. Central and basilar airspace opacities. Small to moderate pleural effusions bilaterally. Moderate cardiomegaly. IMPRESSION: The findings likely represent congestive heart failure with interstitial and alveolar edema. Pleural effusions bilaterally. Electronically Signed   By: Andreas Newport M.D.   On: 05/16/2016 06:53   Dg Chest Port 1 View  Result Date: 05/14/2016 CLINICAL DATA:  Weakness.  Breast cancer.  Hypertension. EXAM: PORTABLE CHEST 1 VIEW COMPARISON:  09/05/2015 FINDINGS: Airspace opacities at both lung bases and in the periphery of both upper lung zones. There is at least moderate enlargement of the cardiopericardial silhouette with atherosclerotic calcification of the aortic arch. Questionable right paratracheal adenopathy. Both lung bases are obscured. Right axillary vascular clips noted. IMPRESSION: 1. Bibasilar airspace opacities with obscuration of the hemidiaphragms. Indistinct peripheral opacities in the upper lobes. Appearance could be from a variety of conditions including resolving pulmonary edema or multilobar pneumonia. 2. Cardiomegaly. Electronically Signed   By: Van Clines M.D.   On: 05/14/2016 20:27   US Abdomen Limited Ruq  Result Date: 05/18/2016 CLINICAL DATA:  Abnormal LFTs EXAM: US ABDOMEN LIMITED - RIGHT UPPER QUADRANT COMPARISON:  CT scan abdomen and pelvis 09/05/2015 FINDINGS: Gallbladder: No gallstones or wall thickening visualized. No sonographic Murphy sign noted by sonographer. Common bile duct: Diameter: 2.4 mm in diameter within normal limits. Liver: There is hypoechoic lesion in right hepatic lobe measures 1.3 x 1.3 cm. Second hypoechoic lesion in right hepatic lobe measures 8 x 8 mm. Further correlation with enhanced  CT or MRI is recommended to exclude solids lesions. IMPRESSION: 1. No gallstones are noted within gallbladder.  Normal CBD. 2.  At least 2 hypoechoic lesions are noted within right hepatic lobe. Further correlation with enhanced CT or MRI is recommended to exclude solid lesions. Electronically Signed   By: Lahoma Crocker M.D.   On: 05/18/2016 11:59    Micro Results     No results found for this or any previous visit (from the past 240 hour(s)).  Today   Subjective    Danielle [name] today has no headache,no chest abdominal pain,no new weakness tingling or numbness, feels much better wants to go home today.     Objective   Blood pressure (!) 128/55, pulse 100, temperature 97.5 F (36.4 C), temperature source Oral, resp. rate 16, height 5\' 4"  (1.626 m), weight 70.9 kg (156 lb 6.4 oz), SpO2 91 %.   Intake/Output Summary (Last 24 hours) at 05/19/16 0906 Last data filed at 05/19/16 0730  Gross per 24 hour  Intake              960 ml  Output             1200 ml  Net             -240 ml    Exam Awake Alert, Oriented x 3, No new F.N deficits, Normal affect [place].AT,PERRAL Supple Neck,No JVD, No cervical lymphadenopathy appriciated.  Symmetrical Chest wall movement, Good air movement bilaterally, CTAB, no rales now RRR,No Gallops,Rubs or new Murmurs, No Parasternal Heave +ve B.Sounds, Abd Soft, Non tender, No organomegaly appriciated, No rebound -guarding or rigidity. No Cyanosis, Clubbing or edema, No new Rash or bruise   Data Review   CBC w Diff:  Lab Results  Component Value Date   WBC 8.0 05/15/2016   HGB 13.6 05/15/2016   HCT 39.7 05/15/2016   PLT 154 05/15/2016   LYMPHOPCT 14 05/14/2016   BANDSPCT 0 05/14/2016   MONOPCT 4 05/14/2016   EOSPCT 1 05/14/2016   BASOPCT 0 05/14/2016    CMP:  Lab Results  Component Value Date   NA 133 (L) 05/19/2016   K 3.9 05/19/2016   CL 93 (L) 05/19/2016   CO2 30 05/19/2016   BUN 41 (H) 05/19/2016   CREATININE 0.78 05/19/2016   PROT 6.2 (L) 05/19/2016   ALBUMIN 3.1 (L) 05/19/2016   BILITOT 0.7 05/19/2016   ALKPHOS 70 05/19/2016   AST 144 (H) 05/19/2016   ALT  95 (H) 05/19/2016  .   Total Time in preparing paper work, data evaluation and todays exam - 70 minutes  Lala Lund M.D on 05/19/2016 at 9:06 AM  [facility]   Office  [phone]

## 2016-05-22 DIAGNOSIS — B370 Candidal stomatitis: Secondary | ICD-10-CM | POA: Insufficient documentation

## 2016-05-22 LAB — POCT INR: INR: 1.2 — AB (ref 0.9–1.1)

## 2016-05-22 LAB — BASIC METABOLIC PANEL
BUN: 40 mg/dL — AB (ref 4–21)
CREATININE: 1 mg/dL (ref 0.5–1.1)
Glucose: 96 mg/dL
POTASSIUM: 4.1 mmol/L (ref 3.4–5.3)
Sodium: 132 mmol/L — AB (ref 137–147)

## 2016-05-22 LAB — CBC AND DIFFERENTIAL
HCT: 39 % (ref 36–46)
Hemoglobin: 12.9 g/dL (ref 12.0–16.0)
Platelets: 134 10*3/uL — AB (ref 150–399)
WBC: 8.6 10*3/mL

## 2016-05-22 LAB — HEPATIC FUNCTION PANEL
ALT: 116 U/L — AB (ref 7–35)
AST: 193 U/L — AB (ref 13–35)
Alkaline Phosphatase: 93 U/L (ref 25–125)
Bilirubin, Total: 1.3 mg/dL

## 2016-05-22 LAB — TSH: TSH: 13.93 u[IU]/mL — AB (ref 0.41–5.90)

## 2016-05-27 LAB — HEPATIC FUNCTION PANEL
ALK PHOS: 79 U/L (ref 25–125)
ALT: 102 U/L — AB (ref 7–35)
AST: 105 U/L — AB (ref 13–35)
Bilirubin, Total: 1.1 mg/dL

## 2016-05-27 LAB — BASIC METABOLIC PANEL
BUN: 32 mg/dL — AB (ref 4–21)
CREATININE: 0.6 mg/dL (ref 0.5–1.1)
Glucose: 116 mg/dL
POTASSIUM: 3.6 mmol/L (ref 3.4–5.3)
Sodium: 133 mmol/L — AB (ref 137–147)

## 2016-05-27 LAB — CBC AND DIFFERENTIAL
HCT: 36 % (ref 36–46)
Hemoglobin: 12.1 g/dL (ref 12.0–16.0)
Platelets: 135 10*3/uL — AB (ref 150–399)
WBC: 13 10^3/mL

## 2016-05-28 LAB — CBC AND DIFFERENTIAL
HEMATOCRIT: 35 % — AB (ref 36–46)
Hemoglobin: 11.7 g/dL — AB (ref 12.0–16.0)
Platelets: 135 10*3/uL — AB (ref 150–399)
WBC: 12.9 10^3/mL

## 2016-05-28 LAB — HEPATIC FUNCTION PANEL
ALT: 115 U/L — AB (ref 7–35)
AST: 119 U/L — AB (ref 13–35)
Alkaline Phosphatase: 78 U/L (ref 25–125)
Bilirubin, Total: 1.5 mg/dL

## 2016-05-28 LAB — BASIC METABOLIC PANEL
BUN: 28 mg/dL — AB (ref 4–21)
Creatinine: 0.5 mg/dL (ref 0.5–1.1)
GLUCOSE: 87 mg/dL
Potassium: 3.7 mmol/L (ref 3.4–5.3)
SODIUM: 134 mmol/L — AB (ref 137–147)

## 2016-05-29 LAB — CBC AND DIFFERENTIAL
HCT: 36 % (ref 36–46)
Hemoglobin: 12.3 g/dL (ref 12.0–16.0)
PLATELETS: 120 10*3/uL — AB (ref 150–399)
WBC: 12.7 10*3/mL

## 2016-05-29 LAB — BASIC METABOLIC PANEL
BUN: 29 mg/dL — AB (ref 4–21)
CREATININE: 0.6 mg/dL (ref 0.5–1.1)
GLUCOSE: 86 mg/dL
Potassium: 3.7 mmol/L (ref 3.4–5.3)
Sodium: 134 mmol/L — AB (ref 137–147)

## 2016-05-30 ENCOUNTER — Telehealth (HOSPITAL_COMMUNITY): Payer: Self-pay

## 2016-05-30 NOTE — Telephone Encounter (Signed)
Patients daughter came by and said her mom was at [facility] and had recently been diagnosed recurrent breast cancer. (patient was last seen in 2014 by PA-C). Daughter states her mom told her to come by [facility] and talk with "Hildred Alamin or Gershon Mussel" and get her set up for treatments here. Explained to daughter to have discharge planning at [facility] call and set up her appointment when she is discharged. Daughter states she is going to a "facility" in [place] for rehab. Explained to daughter that if patient is going to a facility in [place] then most likely they would not transport her to [place] for treatments. Daughter states she will call when her mom is discharged. Gave her triage nurses number and instructed her to call it. Daughter verbalized understanding.

## 2016-05-30 NOTE — Telephone Encounter (Signed)
Dr. Alethia Berthold from [facility] called me yesterday to discuss the patient's case.  She is welcome to come back to [place].  It is too bad that she did not follow-up with [place] as directed and as a result, she likely did not complete her anti-estrogen therapy.  Once discharged, we are happy to see her.    Robynn Pane, PA-C 05/30/2016 4:13 PM

## 2016-06-01 NOTE — Telephone Encounter (Signed)
Spoke with patients daughter, Lynelle Smoke [phone]). Patient is being admitted to [facility]. Daughter could not talk as she was in the middle of getting her mom admitted there. Daughter did state that she would call back, possibly Monday to set appointment up for her mom. She also said if facility would not bring her mom to [facility] then she would.

## 2016-06-04 ENCOUNTER — Non-Acute Institutional Stay (SKILLED_NURSING_FACILITY): Payer: BLUE CROSS/BLUE SHIELD | Admitting: Internal Medicine

## 2016-06-04 DIAGNOSIS — E43 Unspecified severe protein-calorie malnutrition: Secondary | ICD-10-CM | POA: Diagnosis not present

## 2016-06-04 DIAGNOSIS — M6089 Other myositis, multiple sites: Secondary | ICD-10-CM

## 2016-06-04 DIAGNOSIS — M3320 Polymyositis, organ involvement unspecified: Secondary | ICD-10-CM

## 2016-06-04 DIAGNOSIS — C50911 Malignant neoplasm of unspecified site of right female breast: Secondary | ICD-10-CM

## 2016-06-04 DIAGNOSIS — J849 Interstitial pulmonary disease, unspecified: Secondary | ICD-10-CM | POA: Diagnosis not present

## 2016-06-04 DIAGNOSIS — E860 Dehydration: Secondary | ICD-10-CM | POA: Diagnosis not present

## 2016-06-04 DIAGNOSIS — T17908D Unspecified foreign body in respiratory tract, part unspecified causing other injury, subsequent encounter: Secondary | ICD-10-CM

## 2016-06-04 DIAGNOSIS — K219 Gastro-esophageal reflux disease without esophagitis: Secondary | ICD-10-CM

## 2016-06-04 DIAGNOSIS — M359 Systemic involvement of connective tissue, unspecified: Secondary | ICD-10-CM

## 2016-06-04 DIAGNOSIS — E871 Hypo-osmolality and hyponatremia: Secondary | ICD-10-CM | POA: Diagnosis not present

## 2016-06-04 DIAGNOSIS — I951 Orthostatic hypotension: Secondary | ICD-10-CM

## 2016-06-04 DIAGNOSIS — C50919 Malignant neoplasm of unspecified site of unspecified female breast: Secondary | ICD-10-CM

## 2016-06-04 DIAGNOSIS — J9621 Acute and chronic respiratory failure with hypoxia: Secondary | ICD-10-CM | POA: Diagnosis not present

## 2016-06-04 NOTE — Telephone Encounter (Signed)
5/2 to see Dr. Junius Finner, PA-C 06/04/2016 10:47 AM

## 2016-06-05 ENCOUNTER — Non-Acute Institutional Stay (SKILLED_NURSING_FACILITY): Payer: BLUE CROSS/BLUE SHIELD | Admitting: Internal Medicine

## 2016-06-05 ENCOUNTER — Encounter: Payer: Self-pay | Admitting: Internal Medicine

## 2016-06-05 DIAGNOSIS — J9611 Chronic respiratory failure with hypoxia: Secondary | ICD-10-CM

## 2016-06-05 DIAGNOSIS — C50919 Malignant neoplasm of unspecified site of unspecified female breast: Secondary | ICD-10-CM | POA: Insufficient documentation

## 2016-06-05 DIAGNOSIS — J849 Interstitial pulmonary disease, unspecified: Secondary | ICD-10-CM | POA: Diagnosis not present

## 2016-06-05 DIAGNOSIS — M609 Myositis, unspecified: Secondary | ICD-10-CM | POA: Insufficient documentation

## 2016-06-05 DIAGNOSIS — K219 Gastro-esophageal reflux disease without esophagitis: Secondary | ICD-10-CM | POA: Insufficient documentation

## 2016-06-05 DIAGNOSIS — T17908A Unspecified foreign body in respiratory tract, part unspecified causing other injury, initial encounter: Secondary | ICD-10-CM | POA: Insufficient documentation

## 2016-06-05 DIAGNOSIS — M542 Cervicalgia: Secondary | ICD-10-CM

## 2016-06-05 DIAGNOSIS — J9601 Acute respiratory failure with hypoxia: Secondary | ICD-10-CM | POA: Insufficient documentation

## 2016-06-05 DIAGNOSIS — I959 Hypotension, unspecified: Secondary | ICD-10-CM | POA: Insufficient documentation

## 2016-06-05 NOTE — Progress Notes (Signed)
Location:  [facility] Room Number: [phone] Place of Service:  SNF [phone])  Danielle [name]. Danielle Coil, MD  Patient Care Team: Redmond School, MD as PCP - General (Internal Medicine)  Extended Emergency Contact Information Primary Emergency Contact: [facility] Address: [address]          [place], [place] [address] Danielle [name] of [place] Phone: [phone] Mobile Phone: [phone] Relation: Spouse Secondary Emergency Contact: Danielle [name], [place] [address] [place] of [place] Phone: [phone] Relation: Other    Allergies: Bactrim [sulfamethoxazole-trimethoprim] and Cephalexin  Chief Complaint  Patient presents with  . Acute Visit    Acute    HPI: Patient is 61 y.o. 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 Pt is being seen today acutely at request of PT.They noted that her O2 sats are dropping into  the 60's with any effort. Pt is on chronic O2. It was noted by nursing that pt improved tremendously if she received a neb tx during an episode. There is also an issue of pain control, primarily for neck pain from arthritis. Pt is having difficulty with all pain meds,- they make her sleepy.  Past Medical History:  Diagnosis Date  . Borderline hyperlipidemia    managed with diet and exercise  . Breast cancer ([place]) 08/17/2011  . Cancer ([place])   . Cough    smokers cough  . Fatty infiltration of liver 05/13/2013  . Gastric ulcer 09/17/2011   not a current issue  . History of seronegative inflammatory arthritis   . Hypertension   . 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 Myositis   . OA (osteoarthritis) of foot   . Port catheter in place  05/13/2012  . Shortness of breath dyspnea   . Stress fracture of foot     Past Surgical History:  Procedure Laterality Date  . AXILLARY LYMPH NODE DISSECTION  06/04/2011   Procedure: AXILLARY LYMPH NODE DISSECTION;  Surgeon: Donato Heinz, MD;  Location: [facility];  Service: General;  Laterality: Right;  . MASTECTOMY, PARTIAL  06/04/2011   Procedure: MASTECTOMY PARTIAL;  Surgeon: Donato Heinz, MD;  Location: [facility];  Service: General;  Laterality: Right;  . MUSCLE BIOPSY Right 09/08/2015   Procedure: RIGHT THIGH MUSCLE BIOPSY;  Surgeon: Georganna Skeans, MD;  Location: [facility];  Service: General;  Laterality: Right;  . PORT-A-CATH REMOVAL Left 03/22/2014   Procedure: MINOR REMOVAL PORT-A-CATH;  Surgeon: Jamesetta So, MD;  Location: [facility];  Service: General;  Laterality: Left;  . PORTACATH PLACEMENT  07/23/2011   Procedure: INSERTION PORT-A-CATH;  Surgeon: Donato Heinz, MD;  Location: [facility];  Service: General;  Laterality: Left;  Left Subclavian  . SKIN BIOPSY  12/19/2011   Procedure: BIOPSY SKIN;  Surgeon: Donato Heinz, MD;  Location: [facility];  Service: General;  Laterality: Right;  In Minor Room  . TUBAL LIGATION  1980    Allergies as of 06/05/2016      Reactions   Bactrim [sulfamethoxazole-trimethoprim] Nausea Only, Other (See Comments)   Fever and chills along with the nausea.   Cephalexin Palpitations   Reaction:Heart flutter  Medication List       Accurate as of 06/05/16  2:02 PM. Always use your most recent med list.          acetaminophen 325 MG tablet Commonly known as:  TYLENOL Take 650 mg by mouth every 6 (six) hours as needed.   albuterol 0.63 MG/3ML nebulizer solution Commonly known as:  ACCUNEB Take 1 ampule by nebulization every 6 (six) hours as needed for wheezing.   Calcium Carbonate-Vitamin D3 600-400 MG-UNIT Tabs Take 1 tablet by mouth 2 (two) times daily with a meal.   HYDROcodone-acetaminophen 5-325 MG tablet Commonly known as:   NORCO/VICODIN Take 1 tablet by mouth at bedtime as needed for moderate pain.   lidocaine 5 % Commonly known as:  LIDODERM Place 1 patch onto the skin daily. Remove & Discard patch within 12 hours or as directed by MD Apply to most painful area for up to 12 hours in a 24 hour period.   lisinopril 20 MG tablet Commonly known as:  PRINIVIL,ZESTRIL Take 20 mg by mouth daily.   methocarbamol 500 MG tablet Commonly known as:  ROBAXIN Take 250 mg by mouth 3 (three) times daily as needed for muscle spasms.   nystatin 100000 UNIT/ML suspension Commonly known as:  MYCOSTATIN Take 5 mLs by mouth 4 (four) times daily. Swish and swallow   pantoprazole 40 MG tablet Commonly known as:  PROTONIX Take 40 mg by mouth daily.   predniSONE 20 MG tablet Commonly known as:  DELTASONE Take by mouth. Take 60 mg daily for total of 2 weeks, followed by 50 mg daily x 2 weeks , followed by 40 mg daily until seen in follow up with Dr. Rozelle Logan.       No orders of the defined types were placed in this encounter.   Immunization History  Administered Date(s) Administered  . PPD Test 06/01/2016  . Pneumococcal-Unspecified 07/14/2015  . Zoster 07/14/2015    Social History  Substance Use Topics  . Smoking status: Current Every Day Smoker    Packs/day: 1.50    Years: 40.00    Types: Cigarettes  . Smokeless tobacco: Never Used     Comment: 0.5ppd 10/03/15  . Alcohol use 4.8 oz/week    1 Glasses of wine, 7 Cans of beer per week     Comment: 1 glass of wine and 7 cans of beer a week. Pt denies any alcohol issues; no alcohol use in over 3 months    Review of Systems  DATA OBTAINED: from patient, nurse GENERAL:  no fevers,+ fatigue, appetite changes SKIN: No itching, rash HEENT: No complaint RESPIRATORY: No cough, wheezing, +SOB, acute on chronic CARDIAC: No chest pain, palpitations, lower extremity edema  GI: No abdominal pain, No N/V/D or constipation, No heartburn or reflux  GU: No dysuria,  frequency or urgency, or incontinence  MUSCULOSKELETAL: No unrelieved bone/joint pain NEUROLOGIC: No headache, dizziness  PSYCHIATRIC: No overt anxiety or sadness  Vitals:   06/05/16 1401  BP: (!) 141/94  Pulse: (!) 101  Resp: (!) 24  Temp: (!) 96.8 F (36 C)   Body mass index is 24.89 kg/m. Physical Exam  GENERAL APPEARANCE: Alert, conversant, No acute distress; tired appearing SKIN: No diaphoresis rash HEENT: Unremarkable RESPIRATORY: Breathing is even, slt labored. Lung sounds are diffusely  decreased; O2 sat on 2L [place]  resting in the 90's  CARDIOVASCULAR: Heart RRR no murmurs, rubs or gallops. No peripheral edema  GASTROINTESTINAL: Abdomen is soft, non-tender, not distended w/ normal bowel sounds.  GENITOURINARY: Bladder non tender, not distended  MUSCULOSKELETAL: No abnormal joints or musculature NEUROLOGIC: Cranial nerves 2-12 grossly intact. Moves all extremities PSYCHIATRIC: Mood and affect appropriate to situation, no behavioral issues  Patient Active Problem List   Diagnosis Date Noted  . Myositis   . Protein-calorie malnutrition, severe 05/15/2016  . Adrenal insufficiency ([place]) 05/14/2016  . CAP (community acquired pneumonia) 05/14/2016  . AKI (acute kidney injury) ([facility]) 05/14/2016  . Hypokalemia 05/14/2016  . Hyponatremia 05/14/2016  . Fatty infiltration of liver 05/13/2013  . Stiffness of joint, not elsewhere classified, other specified site 01/27/2013  . Lymphedema of arm 01/27/2013  . Stress fracture of foot 06/04/2012  . 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 Pain in joint, shoulder region 04/15/2012  . Muscle tightness 04/15/2012  . Gastric ulcer 09/17/2011  . Dehydration 08/17/2011  . Infiltrating ductal carcinoma of right female breast ([facility]) 08/17/2011  . Hypertension 08/17/2011    CMP     Component Value Date/Time   NA 134 (A) 05/29/2016   K 3.7 05/29/2016   CL 93 (L) 05/19/2016 0605   CO2 30 05/19/2016 0605   GLUCOSE 97 05/19/2016 0605    BUN 29 (A) 05/29/2016   CREATININE 0.6 05/29/2016   CREATININE 0.78 05/19/2016 0605   CALCIUM 9.0 05/19/2016 0605   PROT 6.2 (L) 05/19/2016 0605   ALBUMIN 3.1 (L) 05/19/2016 0605   AST 119 (A) 05/28/2016   ALT 115 (A) 05/28/2016   ALKPHOS 78 05/28/2016   BILITOT 0.7 05/19/2016 0605   GFRNONAA >60 05/19/2016 0605   GFRAA >60 05/19/2016 0605    Recent Labs  05/14/16 1720  05/17/16 0459 05/18/16 0444 05/19/16 0605  05/27/16 05/28/16 05/29/16  NA 127*  < > 128* 130* 133*  < > 133* 134* 134*  K 3.0*  < > 3.9 3.8 3.9  < > 3.6 3.7 3.7  CL 88*  < > 90* 91* 93*  --   --   --   --   CO2 19*  < > 28 28 30   --   --   --   --   GLUCOSE 65  < > 94 84 97  --   --   --   --   BUN 67*  < > 42* 40* 41*  < > 32* 28* 29*  CREATININE 2.04*  < > 0.73 0.74 0.78  < > 0.6 0.5 0.6  CALCIUM 8.7*  < > 8.9 9.0 9.0  --   --   --   --   MG 1.9  --   --   --   --   --   --   --   --   < > = values in this interval not displayed.  Recent Labs  05/14/16 1720 05/19/16 0605 05/22/16 05/27/16 05/28/16  AST 200* 144* 193* 105* 119*  ALT 119* 95* 116* 102* 115*  ALKPHOS 83 70 93 79 78  BILITOT 0.9 0.7  --   --   --   PROT 6.7 6.2*  --   --   --   ALBUMIN 3.2* 3.1*  --   --   --     Recent Labs  09/06/15 1440 05/14/16 1720 05/15/16 0457  05/27/16 05/28/16 05/29/16  WBC 9.9 7.6 8.0  < > 13.0 12.9 12.7  NEUTROABS  --  6.1  --   --   --   --   --   HGB 15.3* 13.9 13.6  < > 12.1 11.7*  12.3  HCT 45.7 39.8 39.7  < > 36 35* 36  MCV 97.6 86.5 87.4  --   --   --   --   PLT 255 161 154  < > 135* 135* 120*  < > = values in this interval not displayed. No results for input(s): CHOL, LDLCALC, TRIG in the last 8760 hours.  Invalid input(s): HCL No results found for: [facility] Lab Results  Component Value Date   TSH 13.93 (A) 05/22/2016   No results found for: HGBA1C Lab Results  Component Value Date   CHOL (H) 09/29/2008    206        ATP III CLASSIFICATION:  <200     mg/dL   Desirable  200-239   mg/dL   Borderline High  >=240    mg/dL   High          HDL 62 09/29/2008   LDLCALC (H) 09/29/2008    123        Total Cholesterol/HDL:CHD Risk Coronary Heart Disease Risk Table                     Men   Women  1/2 Average Risk   3.4   3.3  Average Risk       5.0   4.4  2 X Average Risk   9.6   7.1  3 X Average Risk  23.4   11.0        Use the calculated Patient Ratio above and the CHD Risk Table to determine the patient's CHD Risk.        ATP III CLASSIFICATION (LDL):  <100     mg/dL   Optimal  100-129  mg/dL   Near or Above                    Optimal  130-159  mg/dL   Borderline  160-189  mg/dL   High  >190     mg/dL   Very High   TRIG 104 09/29/2008   CHOLHDL 3.3 09/29/2008    Significant Diagnostic Results in last 30 days:  Dg Chest 2 View  Result Date: 05/18/2016 CLINICAL DATA:  Pneumonia. EXAM: CHEST  2 VIEW COMPARISON:  05/16/2016. FINDINGS: BILATERAL pulmonary opacities are improved. Increasing lung volumes. Cardiomegaly. Less edema. IMPRESSION: Improved aeration could represent clearing pneumonia or decreasing CHF. Electronically Signed   By: Staci Righter M.D.   On: 05/18/2016 11:24   Dg Esophagus  Result Date: 05/18/2016 CLINICAL DATA:  Dysphagia EXAM: ESOPHOGRAM/BARIUM SWALLOW TECHNIQUE: Single contrast examination was performed using thin barium. Patient swallowed a 12.5 mm diameter barium tablet. FLUOROSCOPY TIME:  Fluoroscopy Time:  1 minutes 42 seconds Radiation Exposure Index (if provided by the fluoroscopic device): 28.3 mGy Number of Acquired Spot Images: Multiple screen captures during fluoroscopy COMPARISON:  None FINDINGS: Esophageal distention: Normal without obvious mass or stricture Intraluminal filling defects:  None identified 12.5 mm barium tablet: Passed from oral cavity to stomach without delay/obstruction. Motility:  Diffuse age-related esophageal dysmotility Mucosa:  No definite areas of irregularity or ulceration. Hypopharynx/cervical esophagus: Did  not witness laryngeal penetration or aspiration directly during swallows. However, patient developed an episode of coughing during the exam and subsequent imaging demonstrated aspiration of a small amount of contrast below the vocal cords in the proximal trachea. Hiatal hernia:  Not identified GE reflux:  Not identified Other:  N/A IMPRESSION: Laryngeal penetration and aspiration. Esophageal dysmotility. Electronically Signed   By: Elta Gu[place]oupe  Thornton Papas M.D.   On: 05/18/2016 13:25   Dg Chest Port 1 View  Result Date: 05/16/2016 CLINICAL DATA:  Weakness nausea and vomiting for 1 week. Lower extremity swelling. EXAM: PORTABLE CHEST 1 VIEW COMPARISON:  06/09/2016 FINDINGS: Diffuse vascular and interstitial prominence, worsened. Central and basilar airspace opacities. Small to moderate pleural effusions bilaterally. Moderate cardiomegaly. IMPRESSION: The findings likely represent congestive heart failure with interstitial and alveolar edema. Pleural effusions bilaterally. Electronically Signed   By: Andreas Newport M.D.   On: 05/16/2016 06:53   Dg Chest Port 1 View  Result Date: 05/14/2016 CLINICAL DATA:  Weakness.  Breast cancer.  Hypertension. EXAM: PORTABLE CHEST 1 VIEW COMPARISON:  09/05/2015 FINDINGS: Airspace opacities at both lung bases and in the periphery of both upper lung zones. There is at least moderate enlargement of the cardiopericardial silhouette with atherosclerotic calcification of the aortic arch. Questionable right paratracheal adenopathy. Both lung bases are obscured. Right axillary vascular clips noted. IMPRESSION: 1. Bibasilar airspace opacities with obscuration of the hemidiaphragms. Indistinct peripheral opacities in the upper lobes. Appearance could be from a variety of conditions including resolving pulmonary edema or multilobar pneumonia. 2. Cardiomegaly. Electronically Signed   By: Van Clines M.D.   On: 05/14/2016 20:27   US Abdomen Limited Ruq  Result Date: 05/18/2016 CLINICAL  DATA:  Abnormal LFTs EXAM: US ABDOMEN LIMITED - RIGHT UPPER QUADRANT COMPARISON:  CT scan abdomen and pelvis 09/05/2015 FINDINGS: Gallbladder: No gallstones or wall thickening visualized. No sonographic Murphy sign noted by sonographer. Common bile duct: Diameter: 2.4 mm in diameter within normal limits. Liver: There is hypoechoic lesion in right hepatic lobe measures 1.3 x 1.3 cm. Second hypoechoic lesion in right hepatic lobe measures 8 x 8 mm. Further correlation with enhanced CT or MRI is recommended to exclude solids lesions. IMPRESSION: 1. No gallstones are noted within gallbladder.  Normal CBD. 2. At least 2 hypoechoic lesions are noted within right hepatic lobe. Further correlation with enhanced CT or MRI is recommended to exclude solid lesions. Electronically Signed   By: Lahoma Crocker M.D.   On: 05/18/2016 11:59    Assessment and Plan  ILD/CHRONIC RESP FAILURE - will try doing neb before therapy and inc O2 for therapy; pt is teetering on the brink-hopefully she can hold on long enough to get some strength back  NECK PAIN - from arthritis mostly; it is interfering with therapy; pt says tylenol makes her sleepy and norco makes her sleepy; she can use NSAIDS but she is on prednisone now, i'm very reluctant to use them; pt said she would try a soft collar to see if that would help    Time spent > 35 min Anne D. Danielle Coil, MD

## 2016-06-05 NOTE — Progress Notes (Signed)
: Provider:  Noah Delaine. Sheppard Coil, MD Location:  [facility] Room Number: [phone] Place of Service:  SNF ([phone])  PCP: Glo Herring, MD Patient Care Team: Redmond School, MD as PCP - General (Internal Medicine)  Extended Emergency Contact Information Primary Emergency Contact: [facility] Address: [address]          [place], [place] [address] [name] of [place] Phone: [phone] Mobile Phone: [phone] Relation: Spouse Secondary Emergency Contact: Christ Kick, [place] [address] [place] Phone: [phone] Relation: Other     Allergies: Bactrim [sulfamethoxazole-trimethoprim] and Cephalexin  Chief Complaint  Patient presents with  . New Admit To SNF    Admit to Facility    HPI: Patient is 61 y.o. female with History of infiltrating ductal carcinoma of the right breast status post lumpectomy 2015 chemotherapy/radiation and now concern for recurrence with possible bony involvement, inflammatory myositis, I LD, and recent admission to outside hospital for aspiration pneumonia who was sent to the emergency department from the infusion clinic due to hypotension and concern for overall generalized weakness. Per family patient has been declining for the last few months accelerating over the last 2 weeks with profound weakness and decreased by mouth intake. She denied any recent fevers, chills, chest pain, cough, nausea, vomiting, abdominal pain, dysuria, diarrhea, or constipation. 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 Denyse Amass in [facility] rheumatology. She had been planned for IV Ig infusion today although her weakness and concern for an underlying malignancy or polymyositis mated desirable for patient to have a PET/CT scan ordered which was not performed due to patient's generalized weakness patient was [facility] 10 through April 16 where patient was treated for her  hypotension by stopping all of her blood pressure medications and with IV hydration, where she was treated for her progressive weakness and inflammatory myositis with 4 rounds of IV Ig and where she was found to have acute on chronic hypoxic respiratory failure probably secondary to her interstitial lung disease, but she did have new opacities on her chest film which was felt to be possibly pulmonary edema but Lasix was not able to be used and the patient seemed to tolerate this treatment with only supplemental oxygen. 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 Because of patient's overall malnutrition the finding of hyponatremia and oral rash it was decided the patient needed to be discharged to a skilled nursing facility for strengthening before potentially being treated for metastatic breast CA. She was discharged to another skilled nursing facility on the 16th and she transferred to [facility] living and rehabilitation on April 20. While at skilled nursing facility patient will be followed for hypertension treated with lisinopril, interstitial lung disease now treated with high-dose prednisone and swallowing possible reflux issues treated with Protonix.  Past Medical History:  Diagnosis Date  . Borderline hyperlipidemia    managed with diet and exercise  . Breast cancer ([place]) 08/17/2011  . Cancer ([place])   . Cough    smokers cough  . Fatty infiltration of liver 05/13/2013  . Gastric ulcer 09/17/2011   not a current issue  . History of seronegative inflammatory  arthritis   . Hypertension   . 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 Infiltrating ductal carcinoma of right female breast  ([place]) 08/17/2011  . Myositis   . OA (osteoarthritis) of foot   . Port catheter in place 05/13/2012  . Shortness of breath dyspnea   . Stress fracture of foot     Past Surgical History:  Procedure Laterality Date  . AXILLARY LYMPH NODE DISSECTION  06/04/2011   Procedure: AXILLARY LYMPH NODE DISSECTION;  Surgeon: Donato Heinz, MD;  Location: [facility];  Service: General;  Laterality: Right;  . MASTECTOMY, PARTIAL  06/04/2011   Procedure: MASTECTOMY PARTIAL;  Surgeon: Donato Heinz, MD;  Location: [facility];  Service: General;  Laterality: Right;  . MUSCLE BIOPSY Right 09/08/2015   Procedure: RIGHT THIGH MUSCLE BIOPSY;  Surgeon: Georganna Skeans, MD;  Location: [facility];  Service: General;  Laterality: Right;  . PORT-A-CATH REMOVAL Left 03/22/2014   Procedure: MINOR REMOVAL PORT-A-CATH;  Surgeon: Jamesetta So, MD;  Location: [facility];  Service: General;  Laterality: Left;  . PORTACATH PLACEMENT  07/23/2011   Procedure: INSERTION PORT-A-CATH;  Surgeon: Donato Heinz, MD;  Location: [facility];  Service: General;  Laterality: Left;  Left Subclavian  . SKIN BIOPSY  12/19/2011   Procedure: BIOPSY SKIN;  Surgeon: Donato Heinz, MD;  Location: [facility];  Service: General;  Laterality: Right;  In Minor Room  . TUBAL LIGATION  1980    Allergies as of 06/04/2016      Reactions   Bactrim [sulfamethoxazole-trimethoprim] Nausea Only, Other (See Comments)   Fever and chills along with the nausea.   Cephalexin Palpitations   Reaction:Heart flutter      Medication List       Accurate as of 06/04/16 11:59 PM. Always use your most recent med list.          acetaminophen 325 MG tablet Commonly known as:  TYLENOL Take 650 mg by mouth every 6 (six) hours as needed.   albuterol 0.63 MG/3ML nebulizer solution Commonly known as:  ACCUNEB Take 1 ampule by nebulization every 6 (six) hours as needed for wheezing.   Calcium Carbonate-Vitamin D3 600-400 MG-UNIT Tabs Take 1 tablet by mouth 2 (two) times daily with a  meal.   HYDROcodone-acetaminophen 5-325 MG tablet Commonly known as:  NORCO/VICODIN Take 1 tablet by mouth at bedtime as needed for moderate pain.   lidocaine 5 % Commonly known as:  LIDODERM Place 1 patch onto the skin daily. Remove & Discard patch within 12 hours or as directed by MD Apply to most painful area for up to 12 hours in a 24 hour period.   lisinopril 20 MG tablet Commonly known as:  PRINIVIL,ZESTRIL Take 20 mg by mouth daily.   methocarbamol 500 MG tablet Commonly known as:  ROBAXIN Take 250 mg by mouth 3 (three) times daily as needed for muscle spasms.   nystatin 100000 UNIT/ML suspension Commonly known as:  MYCOSTATIN Take 5 mLs by mouth 4 (four) times daily. Swish and swallow   pantoprazole 40 MG tablet Commonly known as:  PROTONIX Take 40 mg by mouth daily.   predniSONE 20 MG tablet Commonly known as:  DELTASONE Take by mouth. Take 60 mg daily for total of 2 weeks, followed by 50 mg daily x 2 weeks , followed by 40 mg daily until seen in follow up with Dr. Rozelle Logan.       Meds ordered this encounter  Medications  .  Calcium Carbonate-Vitamin D3 600-400 MG-UNIT TABS    Sig: Take 1 tablet by mouth 2 (two) times daily with a meal.  . lidocaine (LIDODERM) 5 %    Sig: Place 1 patch onto the skin daily. Remove & Discard patch within 12 hours or as directed by MD Apply to most painful area for up to 12 hours in a 24 hour period.  Marland Kitchen lisinopril (PRINIVIL,ZESTRIL) 20 MG tablet    Sig: Take 20 mg by mouth daily.  Marland Kitchen nystatin (MYCOSTATIN) 100000 UNIT/ML suspension    Sig: Take 5 mLs by mouth 4 (four) times daily. Swish and swallow  . pantoprazole (PROTONIX) 40 MG tablet    Sig: Take 40 mg by mouth daily.  . predniSONE (DELTASONE) 20 MG tablet    Sig: Take by mouth. Take 60 mg daily for total of 2 weeks, followed by 50 mg daily x 2 weeks , followed by 40 mg daily until seen in follow up with Dr. Rozelle Logan.  Marland Kitchen acetaminophen (TYLENOL) 325 MG tablet    Sig: Take 650 mg by  mouth every 6 (six) hours as needed.  Marland Kitchen HYDROcodone-acetaminophen (NORCO/VICODIN) 5-325 MG tablet    Sig: Take 1 tablet by mouth at bedtime as needed for moderate pain.  . methocarbamol (ROBAXIN) 500 MG tablet    Sig: Take 250 mg by mouth 3 (three) times daily as needed for muscle spasms.  Marland Kitchen albuterol (ACCUNEB) 0.63 MG/3ML nebulizer solution    Sig: Take 1 ampule by nebulization every 6 (six) hours as needed for wheezing.    Immunization History  Administered Date(s) Administered  . PPD Test 06/01/2016  . Pneumococcal-Unspecified 07/14/2015  . Zoster 07/14/2015    Social History  Substance Use Topics  . Smoking status: Current Every Day Smoker    Packs/day: 1.50    Years: 40.00    Types: Cigarettes  . Smokeless tobacco: Never Used     Comment: 0.5ppd 10/03/15  . Alcohol use 4.8 oz/week    1 Glasses of wine, 7 Cans of beer per week     Comment: 1 glass of wine and 7 cans of beer a week. Pt denies any alcohol issues; no alcohol use in over 3 months    Family history is   Family History  Problem Relation Age of Onset  . Heart disease Mother   . Diabetes Mother   . Stroke Mother   . Heart disease Father   . Diabetes Father   . Coronary artery disease Father     blocked arteries around heart  . Cancer Sister     breast cancer  . Osteoarthritis Sister   . Anesthesia problems Neg Hx   . Malignant hyperthermia Neg Hx   . Pseudochol deficiency Neg Hx       Review of Systems  DATA OBTAINED: from patient GENERAL:  no fevers, ++fatigue, +appetite changes SKIN: No itching, or rash EYES: No eye pain, redness, discharge EARS: No earache, tinnitus, change in hearing NOSE: No congestion, drainage or bleeding  MOUTH/THROAT: No mouth or tooth pain, No sore throat RESPIRATORY: No cough, wheezing, SOB CARDIAC: No chest pain, palpitations, lower extremity edema  GI: No abdominal pain, No N/V/D or constipation, No heartburn or reflux  GU: No dysuria, frequency or urgency, or  incontinence  MUSCULOSKELETAL: neck pain/chronic arthritis NEUROLOGIC: No headache, dizziness or focal weakness PSYCHIATRIC: No c/o anxiety or sadness   Vitals:   06/04/16 0926  BP: 130/70  Pulse: 78  Resp: 18  Temp: 98.1 F (36.7  C)    SpO2 Readings from Last 1 Encounters:  06/04/16 92%   Body mass index is 25.05 kg/m.     Physical Exam  GENERAL APPEARANCE: Alert, conversant, appears weak.  SKIN: No diaphoresis rash HEAD: Normocephalic, atraumatic  EYES: Conjunctiva/lids clear. Pupils round, reactive. EOMs intact.  EARS: External exam WNL, canals clear. Hearing grossly normal.  NOSE: No deformity or discharge.  MOUTH/THROAT: Lips w/o lesions  RESPIRATORY: Breathing is even, unlabored. Lung sounds are clear with good BS surprisingly CARDIOVASCULAR: Heart RRR no murmurs, rubs or gallops. No peripheral edema.   GASTROINTESTINAL: Abdomen is soft, non-tender, not distended w/ normal bowel sounds. GENITOURINARY: Bladder non tender, not distended  MUSCULOSKELETAL: No abnormal joints; wasting  musculature NEUROLOGIC:  Cranial nerves 2-12 grossly intact. Moves all extremities  PSYCHIATRIC: Mood and affect appropriate to situation, no behavioral issues  Patient Active Problem List   Diagnosis Date Noted  . Myositis   . Protein-calorie malnutrition, severe 05/15/2016  . Adrenal insufficiency ([place]) 05/14/2016  . CAP (community acquired pneumonia) 05/14/2016  . AKI (acute kidney injury) ([place]) 05/14/2016  . Hypokalemia 05/14/2016  . Hyponatremia 05/14/2016  . Fatty infiltration of liver 05/13/2013  . Stiffness of joint, not elsewhere classified, other specified site 01/27/2013  . Lymphedema of arm 01/27/2013  . Stress fracture of foot 06/04/2012  . 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 Pain in joint, shoulder region 04/15/2012  . Muscle tightness 04/15/2012  . Gastric ulcer 09/17/2011  . Dehydration 08/17/2011  . Infiltrating ductal carcinoma of right female breast  ([place]) 08/17/2011  . Hypertension 08/17/2011      Labs reviewed: Basic Metabolic Panel:    Component Value Date/Time   NA 134 (A) 05/29/2016   K 3.7 05/29/2016   CL 93 (L) 05/19/2016 0605   CO2 30 05/19/2016 0605   GLUCOSE 97 05/19/2016 0605   BUN 29 (A) 05/29/2016   CREATININE 0.6 05/29/2016   CREATININE 0.78 05/19/2016 0605   CALCIUM 9.0 05/19/2016 0605   PROT 6.2 (L) 05/19/2016 0605   ALBUMIN 3.1 (L) 05/19/2016 0605   AST 119 (A) 05/28/2016   ALT 115 (A) 05/28/2016   ALKPHOS 78 05/28/2016   BILITOT 0.7 05/19/2016 0605   GFRNONAA >60 05/19/2016 0605   GFRAA >60 05/19/2016 0605     Recent Labs  05/14/16 1720  05/17/16 0459 05/18/16 0444 05/19/16 0605  05/27/16 05/28/16 05/29/16  NA 127*  < > 128* 130* 133*  < > 133* 134* 134*  K 3.0*  < > 3.9 3.8 3.9  < > 3.6 3.7 3.7  CL 88*  < > 90* 91* 93*  --   --   --   --   CO2 19*  < > 28 28 30   --   --   --   --   GLUCOSE 65  < > 94 84 97  --   --   --   --   BUN 67*  < > 42* 40* 41*  < > 32* 28* 29*  CREATININE 2.04*  < > 0.73 0.74 0.78  < > 0.6 0.5 0.6  CALCIUM 8.7*  < > 8.9 9.0 9.0  --   --   --   --   MG 1.9  --   --   --   --   --   --   --   --   < > = values in this interval not displayed. Liver Function Tests:  Recent Labs  05/14/16 1720 05/19/16  2774 05/22/16 05/27/16 05/28/16  AST 200* 144* 193* 105* 119*  ALT 119* 95* 116* 102* 115*  ALKPHOS 83 70 93 79 78  BILITOT 0.9 0.7  --   --   --   PROT 6.7 6.2*  --   --   --   ALBUMIN 3.2* 3.1*  --   --   --    No results for input(s): LIPASE, AMYLASE in the last 8760 hours. No results for input(s): AMMONIA in the last 8760 hours. CBC:  Recent Labs  09/06/15 1440 05/14/16 1720 05/15/16 0457  05/27/16 05/28/16 05/29/16  WBC 9.9 7.6 8.0  < > 13.0 12.9 12.7  NEUTROABS  --  6.1  --   --   --   --   --   HGB 15.3* 13.9 13.6  < > 12.1 11.7* 12.3  HCT 45.7 39.8 39.7  < > 36 35* 36  MCV 97.6 86.5 87.4  --   --   --   --   PLT 255 161 154  < > 135* 135* 120*    < > = values in this interval not displayed. Lipid No results for input(s): CHOL, HDL, LDLCALC, TRIG in the last 8760 hours.  Cardiac Enzymes:  Recent Labs  05/16/16 0636  CKTOTAL 4,021*   BNP:  Recent Labs  05/14/16 1720  BNP 286.0*   No results found for: MICROALBUR No results found for: HGBA1C Lab Results  Component Value Date   TSH 13.93 (A) 05/22/2016   Lab Results  Component Value Date   VITAMINB12 448 05/28/2012   Lab Results  Component Value Date   FOLATE >20.0 05/28/2012   No results found for: IRON, TIBC, FERRITIN  Imaging and Procedures obtained prior to SNF admission: Dg Chest Port 1 View  Result Date: 05/14/2016 CLINICAL DATA:  Weakness.  Breast cancer.  Hypertension. EXAM: PORTABLE CHEST 1 VIEW COMPARISON:  09/05/2015 FINDINGS: Airspace opacities at both lung bases and in the periphery of both upper lung zones. There is at least moderate enlargement of the cardiopericardial silhouette with atherosclerotic calcification of the aortic arch. Questionable right paratracheal adenopathy. Both lung bases are obscured. Right axillary vascular clips noted. IMPRESSION: 1. Bibasilar airspace opacities with obscuration of the hemidiaphragms. Indistinct peripheral opacities in the upper lobes. Appearance could be from a variety of conditions including resolving pulmonary edema or multilobar pneumonia. 2. Cardiomegaly. Electronically Signed   By: Van Clines M.D.   On: 05/14/2016 20:27     Not all labs, radiology exams or other studies done during hospitalization come through on my EPIC note; however they are reviewed by me.    Assessment and Plan  PROGRESSIVE WEAKNESS/INFLAMMATORY MYOSITIS - tx with 4 rounds IVIG and started on high dose prednisone; may have worsened 2/2 to no tx or to a paraneoplastic syndrome SNF - cont prednisone; supportive care/ OT/PT as tolerated  ACUTE ON CHRONIC HYPOXIC RESP FAILURE/ INTERSTIAL LUNG DISEASE - there may be some  pulmonary edema but was not adressed 2/2 low Na+ and hypotension and pt did fine with supplemental O2; mostly from ILD and some underlying COPD-former smoker  HYPOTENSION/HYPONATREMIA - antihypertensive stopped SNF - lisinopril 20 mg daily restarted; will monitor; F/U bmp  RECURRENT BREAST CA, LIKELY METASTATIC - recent CT with diffuse osseous mets, soft tissue mass r axilla and 2 lesions in liver; needs PET scan SNF - supportive care to get stronger for possible tx for CA  PROTEIN CALORIE MALNUTRITION SNF - dietary to see; protein supplements  ASPIRATION POSSIBLE/GERD SNF-  Cont protonix 40 mg daily ; ST to eval   Time spent > 45 min;> 50% of time with patient was spent reviewing records, labs, tests and studies, counseling and developing plan of care  Webb Silversmith D. Sheppard Coil, MD

## 2016-06-06 ENCOUNTER — Emergency Department (HOSPITAL_COMMUNITY): Payer: BLUE CROSS/BLUE SHIELD

## 2016-06-06 ENCOUNTER — Encounter (HOSPITAL_COMMUNITY): Payer: Self-pay

## 2016-06-06 DIAGNOSIS — Z7189 Other specified counseling: Secondary | ICD-10-CM

## 2016-06-06 DIAGNOSIS — Z9981 Dependence on supplemental oxygen: Secondary | ICD-10-CM | POA: Diagnosis not present

## 2016-06-06 DIAGNOSIS — I959 Hypotension, unspecified: Secondary | ICD-10-CM | POA: Diagnosis present

## 2016-06-06 DIAGNOSIS — Z823 Family history of stroke: Secondary | ICD-10-CM

## 2016-06-06 DIAGNOSIS — K219 Gastro-esophageal reflux disease without esophagitis: Secondary | ICD-10-CM | POA: Diagnosis present

## 2016-06-06 DIAGNOSIS — D696 Thrombocytopenia, unspecified: Secondary | ICD-10-CM | POA: Diagnosis not present

## 2016-06-06 DIAGNOSIS — I110 Hypertensive heart disease with heart failure: Secondary | ICD-10-CM | POA: Diagnosis present

## 2016-06-06 DIAGNOSIS — M3320 Polymyositis, organ involvement unspecified: Secondary | ICD-10-CM | POA: Diagnosis present

## 2016-06-06 DIAGNOSIS — Y95 Nosocomial condition: Secondary | ICD-10-CM | POA: Diagnosis present

## 2016-06-06 DIAGNOSIS — Z8249 Family history of ischemic heart disease and other diseases of the circulatory system: Secondary | ICD-10-CM | POA: Diagnosis not present

## 2016-06-06 DIAGNOSIS — I5033 Acute on chronic diastolic (congestive) heart failure: Secondary | ICD-10-CM | POA: Diagnosis present

## 2016-06-06 DIAGNOSIS — Z01818 Encounter for other preprocedural examination: Secondary | ICD-10-CM

## 2016-06-06 DIAGNOSIS — J9690 Respiratory failure, unspecified, unspecified whether with hypoxia or hypercapnia: Secondary | ICD-10-CM | POA: Diagnosis present

## 2016-06-06 DIAGNOSIS — I214 Non-ST elevation (NSTEMI) myocardial infarction: Secondary | ICD-10-CM

## 2016-06-06 DIAGNOSIS — Z853 Personal history of malignant neoplasm of breast: Secondary | ICD-10-CM

## 2016-06-06 DIAGNOSIS — Z8711 Personal history of peptic ulcer disease: Secondary | ICD-10-CM | POA: Diagnosis not present

## 2016-06-06 DIAGNOSIS — Z881 Allergy status to other antibiotic agents status: Secondary | ICD-10-CM | POA: Diagnosis not present

## 2016-06-06 DIAGNOSIS — Z7952 Long term (current) use of systemic steroids: Secondary | ICD-10-CM | POA: Diagnosis not present

## 2016-06-06 DIAGNOSIS — J810 Acute pulmonary edema: Secondary | ICD-10-CM | POA: Diagnosis not present

## 2016-06-06 DIAGNOSIS — Z833 Family history of diabetes mellitus: Secondary | ICD-10-CM

## 2016-06-06 DIAGNOSIS — I5031 Acute diastolic (congestive) heart failure: Secondary | ICD-10-CM | POA: Diagnosis not present

## 2016-06-06 DIAGNOSIS — C7951 Secondary malignant neoplasm of bone: Secondary | ICD-10-CM | POA: Diagnosis present

## 2016-06-06 DIAGNOSIS — G729 Myopathy, unspecified: Secondary | ICD-10-CM

## 2016-06-06 DIAGNOSIS — E785 Hyperlipidemia, unspecified: Secondary | ICD-10-CM | POA: Diagnosis present

## 2016-06-06 DIAGNOSIS — Z803 Family history of malignant neoplasm of breast: Secondary | ICD-10-CM | POA: Diagnosis not present

## 2016-06-06 DIAGNOSIS — J9621 Acute and chronic respiratory failure with hypoxia: Secondary | ICD-10-CM | POA: Diagnosis present

## 2016-06-06 DIAGNOSIS — M19079 Primary osteoarthritis, unspecified ankle and foot: Secondary | ICD-10-CM | POA: Diagnosis present

## 2016-06-06 DIAGNOSIS — R748 Abnormal levels of other serum enzymes: Secondary | ICD-10-CM | POA: Diagnosis not present

## 2016-06-06 DIAGNOSIS — Z9221 Personal history of antineoplastic chemotherapy: Secondary | ICD-10-CM | POA: Diagnosis not present

## 2016-06-06 DIAGNOSIS — J9601 Acute respiratory failure with hypoxia: Secondary | ICD-10-CM | POA: Diagnosis present

## 2016-06-06 DIAGNOSIS — D6959 Other secondary thrombocytopenia: Secondary | ICD-10-CM | POA: Diagnosis present

## 2016-06-06 DIAGNOSIS — J189 Pneumonia, unspecified organism: Secondary | ICD-10-CM

## 2016-06-06 DIAGNOSIS — Z4659 Encounter for fitting and adjustment of other gastrointestinal appliance and device: Secondary | ICD-10-CM

## 2016-06-06 DIAGNOSIS — I469 Cardiac arrest, cause unspecified: Secondary | ICD-10-CM | POA: Diagnosis not present

## 2016-06-06 DIAGNOSIS — M359 Systemic involvement of connective tissue, unspecified: Secondary | ICD-10-CM | POA: Diagnosis not present

## 2016-06-06 DIAGNOSIS — I509 Heart failure, unspecified: Secondary | ICD-10-CM

## 2016-06-06 DIAGNOSIS — E876 Hypokalemia: Secondary | ICD-10-CM | POA: Diagnosis present

## 2016-06-06 DIAGNOSIS — I21A1 Myocardial infarction type 2: Secondary | ICD-10-CM | POA: Diagnosis present

## 2016-06-06 DIAGNOSIS — R0600 Dyspnea, unspecified: Secondary | ICD-10-CM

## 2016-06-06 DIAGNOSIS — Z87891 Personal history of nicotine dependence: Secondary | ICD-10-CM | POA: Diagnosis not present

## 2016-06-06 DIAGNOSIS — I4510 Unspecified right bundle-branch block: Secondary | ICD-10-CM | POA: Diagnosis present

## 2016-06-06 DIAGNOSIS — I4901 Ventricular fibrillation: Secondary | ICD-10-CM | POA: Diagnosis not present

## 2016-06-06 DIAGNOSIS — R0602 Shortness of breath: Secondary | ICD-10-CM | POA: Diagnosis present

## 2016-06-06 LAB — CBC AND DIFFERENTIAL
HEMATOCRIT: 30 % — AB (ref 36–46)
HEMOGLOBIN: 10.5 g/dL — AB (ref 12.0–16.0)
PLATELETS: 106 10*3/uL — AB (ref 150–399)
WBC: 11.3 10*3/mL

## 2016-06-06 LAB — COMPREHENSIVE METABOLIC PANEL
ALK PHOS: 147 U/L — AB (ref 38–126)
ALT: 92 U/L — ABNORMAL HIGH (ref 14–54)
ANION GAP: 13 (ref 5–15)
AST: 101 U/L — ABNORMAL HIGH (ref 15–41)
Albumin: 3.1 g/dL — ABNORMAL LOW (ref 3.5–5.0)
BILIRUBIN TOTAL: 1.2 mg/dL (ref 0.3–1.2)
BUN: 25 mg/dL — ABNORMAL HIGH (ref 6–20)
CALCIUM: 8.8 mg/dL — AB (ref 8.9–10.3)
CO2: 22 mmol/L (ref 22–32)
Chloride: 103 mmol/L (ref 101–111)
Creatinine, Ser: 0.55 mg/dL (ref 0.44–1.00)
GFR calc Af Amer: >60 mL/min (ref >60)
GFR calc non Af Amer: >60 mL/min (ref >60)
GLUCOSE: 83 mg/dL (ref 65–99)
Potassium: 3.4 mmol/L — ABNORMAL LOW (ref 3.5–5.1)
Sodium: 138 mmol/L (ref 135–145)
TOTAL PROTEIN: 6.8 g/dL (ref 6.5–8.1)

## 2016-06-06 LAB — CBC WITH DIFFERENTIAL/PLATELET
BASOS ABS: 0.1 10*3/uL (ref 0.0–0.1)
Basophils Relative: 2 %
EOS ABS: 0 10*3/uL (ref 0.0–0.7)
Eosinophils Relative: 0 %
HCT: 31.9 % — ABNORMAL LOW (ref 36.0–46.0)
HEMOGLOBIN: 10.5 g/dL — AB (ref 12.0–15.0)
Lymphocytes Relative: 21 %
Lymphs Abs: 1.8 10*3/uL (ref 0.7–4.0)
MCH: 29.1 pg (ref 26.0–34.0)
MCHC: 32.9 g/dL (ref 30.0–36.0)
MCV: 88.4 fL (ref 78.0–100.0)
MONO ABS: 0.6 10*3/uL (ref 0.1–1.0)
Monocytes Relative: 7 %
NEUTROS ABS: 6 10*3/uL (ref 1.7–7.7)
Neutrophils Relative %: 70 %
Platelets: 91 10*3/uL — ABNORMAL LOW (ref 150–400)
RBC: 3.61 MIL/uL — ABNORMAL LOW (ref 3.87–5.11)
RDW: 18.3 % — AB (ref 11.5–15.5)
WBC: 8.6 10*3/uL (ref 4.0–10.5)

## 2016-06-06 LAB — MRSA PCR SCREENING: MRSA by PCR: NEGATIVE

## 2016-06-06 LAB — I-STAT CG4 LACTIC ACID, ED
LACTIC ACID, VENOUS: 1.35 mmol/L (ref 0.5–1.9)
Lactic Acid, Venous: 1.65 mmol/L (ref 0.5–1.9)

## 2016-06-06 LAB — TROPONIN I
TROPONIN I: 0.65 ng/mL — AB (ref <0.03)
TROPONIN I: 0.68 ng/mL — AB (ref <0.03)
TROPONIN I: 0.73 ng/mL — AB (ref <0.03)

## 2016-06-06 LAB — BASIC METABOLIC PANEL
BUN: 25 mg/dL — AB (ref 4–21)
CREATININE: 0.4 mg/dL — AB (ref 0.5–1.1)
Glucose: 76 mg/dL
Potassium: 3.9 mmol/L (ref 3.4–5.3)
Sodium: 139 mmol/L (ref 137–147)

## 2016-06-06 LAB — BRAIN NATRIURETIC PEPTIDE: B Natriuretic Peptide: 618 pg/mL — ABNORMAL HIGH (ref 0.0–100.0)

## 2016-06-06 LAB — I-STAT TROPONIN, ED: TROPONIN I, POC: 0.73 ng/mL — AB (ref 0.00–0.08)

## 2016-06-06 MED ORDER — ACETAMINOPHEN 325 MG PO TABS: 650.0000 mg | ORAL_TABLET | Freq: Four times a day (QID) | ORAL | Status: DC | PRN

## 2016-06-06 MED ORDER — IOPAMIDOL (ISOVUE-370) INJECTION 76%
INTRAVENOUS | Status: AC
  Administered 2016-06-06: 82 mL
  Filled 2016-06-06: qty 100

## 2016-06-06 MED ORDER — SODIUM CHLORIDE 0.9% FLUSH: 3.0000 mL | INTRAVENOUS | Status: DC | PRN

## 2016-06-06 MED ORDER — ONDANSETRON HCL 4 MG PO TABS: 4.0000 mg | ORAL_TABLET | Freq: Four times a day (QID) | ORAL | Status: DC | PRN

## 2016-06-06 MED ORDER — METHOCARBAMOL 500 MG PO TABS
250.0000 mg | ORAL_TABLET | Freq: Three times a day (TID) | ORAL | Status: DC | PRN
  Administered 2016-06-06: 250 mg via ORAL
  Filled 2016-06-06: qty 1

## 2016-06-06 MED ORDER — CALCIUM CARBONATE 1500 (600 CA) MG PO TABS
600.0000 mg | ORAL_TABLET | Freq: Two times a day (BID) | ORAL | Status: DC
  Filled 2016-06-06: qty 1

## 2016-06-06 MED ORDER — SODIUM CHLORIDE 0.9 % IV SOLN: 250.0000 mL | INTRAVENOUS | Status: DC | PRN

## 2016-06-06 MED ORDER — SODIUM CHLORIDE 0.9% FLUSH
3.0000 mL | Freq: Two times a day (BID) | INTRAVENOUS | Status: DC
  Administered 2016-06-06: 3 mL via INTRAVENOUS

## 2016-06-06 MED ORDER — SODIUM CHLORIDE 0.9% FLUSH
3.0000 mL | Freq: Two times a day (BID) | INTRAVENOUS | Status: DC
  Administered 2016-06-06 – 2016-06-07 (×2): 3 mL via INTRAVENOUS

## 2016-06-06 MED ORDER — HYDROCODONE-ACETAMINOPHEN 5-325 MG PO TABS
1.0000 | ORAL_TABLET | Freq: Every evening | ORAL | Status: DC | PRN
  Administered 2016-06-06: 1 via ORAL
  Filled 2016-06-06: qty 1

## 2016-06-06 MED ORDER — ONDANSETRON HCL 4 MG/2ML IJ SOLN: 4.0000 mg | Freq: Four times a day (QID) | INTRAMUSCULAR | Status: DC | PRN

## 2016-06-06 MED ORDER — PREDNISONE 20 MG PO TABS
60.0000 mg | ORAL_TABLET | Freq: Every day | ORAL | Status: DC
  Administered 2016-06-07: 60 mg via ORAL
  Filled 2016-06-06: qty 3

## 2016-06-06 MED ORDER — ACETAMINOPHEN 650 MG RE SUPP: 650.0000 mg | Freq: Four times a day (QID) | RECTAL | Status: DC | PRN

## 2016-06-06 MED ORDER — HEPARIN BOLUS VIA INFUSION
3000.0000 [IU] | Freq: Once | INTRAVENOUS | Status: AC
  Administered 2016-06-06: 3000 [IU] via INTRAVENOUS
  Filled 2016-06-06: qty 3000

## 2016-06-06 MED ORDER — CALCIUM CARBONATE 1250 (500 CA) MG PO TABS
1.0000 | ORAL_TABLET | Freq: Two times a day (BID) | ORAL | Status: DC
  Administered 2016-06-07: 500 mg via ORAL
  Filled 2016-06-06: qty 1

## 2016-06-06 MED ORDER — SODIUM CHLORIDE 0.9 % IV SOLN
1000.0000 mL | INTRAVENOUS | Status: DC
  Administered 2016-06-06: 1000 mL via INTRAVENOUS

## 2016-06-06 MED ORDER — PANTOPRAZOLE SODIUM 40 MG PO TBEC
40.0000 mg | DELAYED_RELEASE_TABLET | Freq: Every day | ORAL | Status: DC
  Administered 2016-06-07: 40 mg via ORAL
  Filled 2016-06-06: qty 1

## 2016-06-06 MED ORDER — ASPIRIN 81 MG PO CHEW
324.0000 mg | CHEWABLE_TABLET | Freq: Once | ORAL | Status: AC
  Administered 2016-06-06: 324 mg via ORAL
  Filled 2016-06-06: qty 4

## 2016-06-06 MED ORDER — HEPARIN (PORCINE) IN NACL 100-0.45 UNIT/ML-% IJ SOLN
900.0000 [IU]/h | INTRAMUSCULAR | Status: DC
  Administered 2016-06-06 (×2): 1000 [IU]/h via INTRAVENOUS
  Filled 2016-06-06 (×2): qty 250

## 2016-06-06 MED ORDER — POTASSIUM CHLORIDE CRYS ER 20 MEQ PO TBCR
40.0000 meq | EXTENDED_RELEASE_TABLET | Freq: Once | ORAL | Status: AC
  Administered 2016-06-06: 40 meq via ORAL
  Filled 2016-06-06: qty 2

## 2016-06-06 MED ORDER — FUROSEMIDE 10 MG/ML IJ SOLN
40.0000 mg | Freq: Two times a day (BID) | INTRAMUSCULAR | Status: DC
  Administered 2016-06-07: 40 mg via INTRAVENOUS
  Filled 2016-06-06 (×2): qty 4

## 2016-06-06 NOTE — Progress Notes (Signed)
Rt placed NIV/BIPAP on pt per MD order. Pt could not tolerate. Pt stated it was to much pressure. Rt had BIPAP on lowest pressure. Pt on 15 LPM HFNC. RN at bedside no distress noted but pt is SOB.

## 2016-06-06 NOTE — Consult Note (Signed)
Reason for Consult:   Elevated Troponin  Requesting Physician: Dr Cathlean Sauer Primary Cardiologist new  HPI:   MALENE BLAYDES is a 61 y.o. female who is being seen today for the evaluation of an elevated Troponin level at the request of Dr Cathlean Sauer.  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 She was eventually discharged to SNF on O2   She is admitted now with increasing respiratory difficulty. CXR suggests CHF. Her Troponin is also elevated- 0.73 peak but her CK is 4k. An echo done is 2013 (when she had breast cancer) showed her EF to be normal.    PMHx:  Past Medical History:  Diagnosis Date  . Borderline hyperlipidemia    managed with diet and exercise  . Breast cancer ([place]) 08/17/2011  . Cancer ([place])   . Cough    smokers cough  . Fatty infiltration of liver 05/13/2013  . Gastric ulcer 09/17/2011   not a current issue  . History of seronegative inflammatory arthritis   . Hypertension   . 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 Myositis   . OA (osteoarthritis) of foot   . Port catheter in place 05/13/2012  . Shortness of breath dyspnea   . Stress fracture of foot     Past Surgical History:  Procedure Laterality Date  . AXILLARY LYMPH NODE DISSECTION  06/04/2011   Procedure: AXILLARY LYMPH NODE DISSECTION;  Surgeon: Donato Heinz, MD;  Location: [facility];  Service: General;  Laterality: Right;  . MASTECTOMY, PARTIAL  06/04/2011   Procedure: MASTECTOMY PARTIAL;  Surgeon: Donato Heinz, MD;  Location: [facility];  Service: General;  Laterality: Right;  . MUSCLE BIOPSY Right 09/08/2015   Procedure: RIGHT THIGH MUSCLE BIOPSY;  Surgeon: Georganna Skeans, MD;   Location: [place];  Service: General;  Laterality: Right;  . PORT-A-CATH REMOVAL Left 03/22/2014   Procedure: MINOR REMOVAL PORT-A-CATH;  Surgeon: Jamesetta So, MD;  Location: [facility];  Service: General;  Laterality: Left;  . PORTACATH PLACEMENT  07/23/2011   Procedure: INSERTION PORT-A-CATH;  Surgeon: Donato Heinz, MD;  Location: [facility];  Service: General;  Laterality: Left;  Left Subclavian  . SKIN BIOPSY  12/19/2011   Procedure: BIOPSY SKIN;  Surgeon: Donato Heinz, MD;  Location: [facility];  Service: General;  Laterality: Right;  In Minor Room  . TUBAL LIGATION  1980    SOCHx:  reports that she has quit smoking. Her smoking use included Cigarettes. She has a 60.00 pack-year smoking history. She has never used smokeless tobacco. She reports that she does not drink alcohol or use drugs.  FAMHx: Family History  Problem Relation Age of Onset  . Heart disease Mother   . Diabetes Mother   . Stroke Mother   . Heart disease Father   . Diabetes Father   . Coronary artery disease Father     blocked arteries around heart  . Cancer Sister     breast cancer  . Osteoarthritis Sister   . Anesthesia problems Neg Hx   . Malignant hyperthermia Neg Hx   . Pseudochol deficiency Neg Hx     ALLERGIES: Allergies  Allergen  Reactions  . Bactrim [Sulfamethoxazole-Trimethoprim] Nausea And Vomiting and Other (See Comments)    Reaction:  Fever and chills   . Cephalexin Palpitations    ROS: Review of Systems: General: negative for chills, fever Cardiovascular: negative for chest pain,  edema, orthopnea, palpitations, paroxysmal nocturnal dyspnea  HEENT: negative for any visual disturbances, blindness, glaucoma Dermatological: negative for rash Respiratory: negative for hemoptysis, or wheezing Urologic: negative for hematuria or dysuria Abdominal: negative for nausea, vomiting, diarrhea, bright red blood per rectum, melena, or hematemesis Neurologic: negative for visual changes, syncope, or  dizziness Musculoskeletal: negative for back pain, joint pain, or swelling Psych: cooperative and appropriate All other systems reviewed and are otherwise negative except as noted above.   HOME MEDICATIONS: Prior to Admission medications   Medication Sig Start Date End Date Taking? Authorizing Provider  acetaminophen (TYLENOL) 325 MG tablet Take 650 mg by mouth every 6 (six) hours as needed for mild pain or moderate pain.    Yes Historical Provider, MD  albuterol (ACCUNEB) 0.63 MG/3ML nebulizer solution Take 1 ampule by nebulization every 6 (six) hours as needed for wheezing.   Yes Historical Provider, MD  calcium carbonate (OSCAL) 1500 (600 Ca) MG TABS tablet Take 600 mg of elemental calcium by mouth 2 (two) times daily with a meal.   Yes Historical Provider, MD  HYDROcodone-acetaminophen (NORCO/VICODIN) 5-325 MG tablet Take 1 tablet by mouth at bedtime as needed for moderate pain.   Yes Historical Provider, MD  lidocaine (LIDODERM) 5 % Place 1 patch onto the skin daily. Remove & Discard patch within 12 hours or as directed by MD Apply to most painful area for up to 12 hours in a 24 hour period.   Yes Historical Provider, MD  lisinopril (PRINIVIL,ZESTRIL) 20 MG tablet Take 20 mg by mouth daily.   Yes Historical Provider, MD  methocarbamol (ROBAXIN) 500 MG tablet Take 250 mg by mouth 3 (three) times daily as needed for muscle spasms.   Yes Historical Provider, MD  pantoprazole (PROTONIX) 40 MG tablet Take 40 mg by mouth daily before breakfast.    Yes Historical Provider, MD  predniSONE (DELTASONE) 10 MG tablet Take 10-60 mg by mouth daily with breakfast. Pt is to take as a taper:  Six tablets daily for two weeks, five tablets daily for two weeks, and four tablets daily until follow-up appointment with Dr. Rozelle Logan. 06/01/16  Yes Historical Provider, MD    HOSPITAL MEDICATIONS: I have reviewed the patient's current medications.  VITALS: Blood pressure 122/70, pulse 97, temperature 97.9 F (36.6  C), temperature source Oral, resp. rate (!) 21, height 5\' 4"  (1.626 m), weight 145 lb (65.8 kg), SpO2 99 %.  PHYSICAL EXAM: General appearance: alert, cooperative, no distress and on facemask O2 Neck: no carotid bruit and no JVD Lungs: crackles Rt base Heart: regular rate and rhythm Abdomen: soft, non-tender; bowel sounds normal; no masses,  no organomegaly Extremities: extremities normal, atraumatic, no cyanosis or edema Pulses: 2+ and symmetric Skin: pale, cool, dry Neurologic: Grossly normal  LABS: Results for orders placed or performed during the hospital encounter of 05/18/2016 (from the past 24 hour(s))  Comprehensive metabolic panel     Status: Abnormal   Collection Time: 06/10/2016 11:48 AM  Result Value Ref Range   Sodium 138 135 - 145 mmol/L   Potassium 3.4 (L) 3.5 - 5.1 mmol/L   Chloride 103 101 - 111 mmol/L   CO2 22 22 - 32 mmol/L   Glucose, Bld 83 65 - 99 mg/dL  BUN 25 (H) 6 - 20 mg/dL   Creatinine, Ser 0.55 0.44 - 1.00 mg/dL   Calcium 8.8 (L) 8.9 - 10.3 mg/dL   Total Protein 6.8 6.5 - 8.1 g/dL   Albumin 3.1 (L) 3.5 - 5.0 g/dL   AST 101 (H) 15 - 41 U/L   ALT 92 (H) 14 - 54 U/L   Alkaline Phosphatase 147 (H) 38 - 126 U/L   Total Bilirubin 1.2 0.3 - 1.2 mg/dL   GFR calc non Af Amer >60 >60 mL/min   GFR calc Af Amer >60 >60 mL/min   Anion gap 13 5 - 15  CBC WITH DIFFERENTIAL     Status: Abnormal   Collection Time: 05/22/2016 11:48 AM  Result Value Ref Range   WBC 8.6 4.0 - 10.5 K/uL   RBC 3.61 (L) 3.87 - 5.11 MIL/uL   Hemoglobin 10.5 (L) 12.0 - 15.0 g/dL   HCT 31.9 (L) 36.0 - 46.0 %   MCV 88.4 78.0 - 100.0 fL   MCH 29.1 26.0 - 34.0 pg   MCHC 32.9 30.0 - 36.0 g/dL   RDW 18.3 (H) 11.5 - 15.5 %   Platelets 91 (L) 150 - 400 K/uL   Neutrophils Relative % 70 %   Neutro Abs 6.0 1.7 - 7.7 K/uL   Lymphocytes Relative 21 %   Lymphs Abs 1.8 0.7 - 4.0 K/uL   Monocytes Relative 7 %   Monocytes Absolute 0.6 0.1 - 1.0 K/uL   Eosinophils Relative 0 %   Eosinophils Absolute  0.0 0.0 - 0.7 K/uL   Basophils Relative 2 %   Basophils Absolute 0.1 0.0 - 0.1 K/uL   RBC Morphology RARE NRBCs   Brain natriuretic peptide     Status: Abnormal   Collection Time: 06/03/2016 11:48 AM  Result Value Ref Range   B Natriuretic Peptide 618.0 (H) 0.0 - 100.0 pg/mL  Troponin I     Status: Abnormal   Collection Time: 05/27/2016 11:48 AM  Result Value Ref Range   Troponin I 0.65 (HH) <0.03 ng/mL  I-stat troponin, ED     Status: Abnormal   Collection Time: 05/17/2016 11:57 AM  Result Value Ref Range   Troponin i, poc 0.73 (HH) 0.00 - 0.08 ng/mL   Comment NOTIFIED PHYSICIAN    Comment 3          I-Stat CG4 Lactic Acid, ED  (not at  [facility])     Status: None   Collection Time: 06/04/2016 11:59 AM  Result Value Ref Range   Lactic Acid, Venous 1.65 0.5 - 1.9 mmol/L  I-Stat CG4 Lactic Acid, ED  (not at  [facility])     Status: None   Collection Time: 06/04/2016  2:49 PM  Result Value Ref Range   Lactic Acid, Venous 1.35 0.5 - 1.9 mmol/L  Troponin I     Status: Abnormal   Collection Time: 05/30/2016  2:51 PM  Result Value Ref Range   Troponin I 0.68 (HH) <0.03 ng/mL    EKG: NSR, NSST changes and TWI  IMAGING: Ct Angio Chest Pe W And/or Wo Contrast  Result Date: 06/04/2016 CLINICAL DATA:  Shortness of breath and hypoxia EXAM: CT ANGIOGRAPHY CHEST WITH CONTRAST TECHNIQUE: Multidetector CT imaging of the chest was performed using the standard protocol during bolus administration of intravenous contrast. Multiplanar CT image reconstructions and MIPs were obtained to evaluate the vascular anatomy. CONTRAST:  82 mL Isovue 370. COMPARISON:  06/06/2016, 09/05/2015 FINDINGS: Cardiovascular: Diffuse atherosclerotic changes of the thoracic aorta  and its branches are noted. No evidence of dissection is seen. Mild dilatation of the ascending aorta 4 cm is noted. The pulmonary artery demonstrates a normal branching pattern. No intraluminal filling defect is identified to suggest pulmonary embolism. Mild coronary  calcifications are noted. Mild cardiac enlargement is seen. No pericardial effusion is noted. Mediastinum/Nodes: The thoracic inlet is within normal limits. No significant hilar or mediastinal adenopathy is noted. Scattered small lymph nodes are noted in the prevascular region. Mildly calcified lymph node nodes are seen in the subcarinal region as well as within the hila bilaterally. These would be consistent with prior granulomatous disease. The esophagus as visualize is within normal limits. Lungs/Pleura: Bilateral pleural effusions are noted right greater than left. Diffuse emphysematous changes are seen throughout both lungs. Patchy infiltrative changes are noted in the bases bilaterally slightly greater on the right than the left. Some of these have a somewhat nodular appearance particularly in the right lower lobe on image number 36 of series 11. An 8 mm noncalcified nodule is noted in the left upper lobe along the fissure best seen on image number 17 of series 11. Scattered calcified granulomas are noted as well. Upper Abdomen: The visualized upper abdomen shows scattered calcified splenic granulomas. Musculoskeletal: Postsurgical changes are noted in the right breast consistent with the patient clinical history. Mottled appearance to the bony structures is noted diffusely but most notable within the thoracic spine. A healing fracture of the right fourth rib is noted posterolaterally. In expansile lesion is noted involving the central portion of the first rib adjacent to the thoracic spine. Some scattered lytic lesions are noted within the scapula on the left. Compression deformities of C7, T1, T10 and T11 are noted of uncertain age. Review of the MIP images confirms the above findings. IMPRESSION: Bilateral pleural effusions right greater than left as well as patchy infiltrative changes in the bases with some nodular changes seen. Although these may simply represent pneumonic infiltrates the possibility of  neoplastic involvement deserves consideration. The bony structures demonstrates significant lytic lesions most notable in the thoracic spine with a T11 and T10 compression deformity as well as mild height loss at C7 and T1. These changes are consistent with metastatic disease. Changes consistent with prior granulomatous disease. Electronically Signed   By: Inez Catalina M.D.   On: 05/27/2016 16:02   Dg Chest Port 1 View  Result Date: 06/05/2016 CLINICAL DATA:  61 year old former smoker presenting with acute onset of cough. Hypoxia is present. Personal history of right breast cancer. EXAM: PORTABLE CHEST 1 VIEW COMPARISON:  05/18/2016, 05/16/2016 and earlier. FINDINGS: Cardiac silhouette moderately enlarged. Mild-to-moderate diffuse interstitial pulmonary edema. Chronic persistent bilateral pleural effusions, moderate to large in size, unchanged. Associated dense consolidation in the lower lobes. Surgical clips in the right axilla from prior node dissection. IMPRESSION: 1. Acute mild-to-moderate CHF, with stable cardiomegaly and interstitial pulmonary edema. 2. Chronic persistent moderate to large bilateral pleural effusions, right greater than left, with associated chronic passive atelectasis in the lower lobes (favored over pneumonia). Electronically Signed   By: Evangeline Dakin M.D.   On: 05/20/2016 11:13    IMPRESSION:  1. Acute on chronic respiratory failure- CXR and BNP suggest acute CHF  2. Troponin level elevated- doubt acute coronary syndrome. This could be from Myositis  3. 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 Her CK is 4k  4. H/O breast cancer-surgery and chemotherapy in 2013, LVF normal by echo then  5. Thrombocytopenia- Plts down to 90k, watch closely especially since the  pt was placed on Heparin (not sure she needs this as her Troponin elevation is probably not acute ischemia).    RECOMMENDATION: MD to see  Time Spent Directly with Patient: 50 minutes  Kerin Ransom, [place]  beeper 06/05/2016, 5:09 PM

## 2016-06-06 NOTE — ED Notes (Signed)
Bed: JS43 Expected date:  Expected time:  Means of arrival:  Comments: EMS/short of breath

## 2016-06-06 NOTE — ED Notes (Signed)
Attempted to place patient back on O2 6L/min via [place], but sats decreased to 86%. Patient placed back on NRB  With O2 10L/min.

## 2016-06-06 NOTE — Progress Notes (Signed)
[place] for Heparin Indication: chest pain/ACS  Allergies  Allergen Reactions  . Bactrim [Sulfamethoxazole-Trimethoprim] Nausea And Vomiting and Other (See Comments)    Reaction:  Fever and chills   . Cephalexin Palpitations   Patient Measurements: Height: 5\' 4"  (162.6 cm) Weight: 145 lb (65.8 kg) IBW/kg (Calculated) : 54.7 Heparin Dosing Weight: 65.8 kg  Vital Signs: Temp: 97.9 F (36.6 C) (04/25 0931) Temp Source: Oral (04/25 0931) BP: 120/78 (04/25 1519) Pulse Rate: 96 (04/25 1519)  Labs:  Recent Labs  05/17/2016 05/27/2016 1148 05/15/2016 1451  HGB 10.5* 10.5*  --   HCT 30* 31.9*  --   PLT 106* 91*  --   CREATININE 0.4* 0.55  --   TROPONINI  --  0.65* 0.68*   Estimated Creatinine Clearance: 68.9 mL/min (by C-G formula based on SCr of 0.55 mg/dL).  Medical History: Past Medical History:  Diagnosis Date  . Borderline hyperlipidemia    managed with diet and exercise  . Breast cancer ([place]) 08/17/2011  . Cancer ([place])   . Cough    smokers cough  . Fatty infiltration of liver 05/13/2013  . Gastric ulcer 09/17/2011   not a current issue  . History of seronegative inflammatory arthritis   . Hypertension   . 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 Myositis   . OA (osteoarthritis) of foot   . Port catheter in place 05/13/2012  . Shortness of breath dyspnea   . Stress fracture of foot    Medications:  Scheduled:  . heparin  3,000 Units Intravenous Once   Infusions:  . sodium chloride 1,000 mL (05/31/2016 1154)  . heparin     Assessment: 14 yoF from SNF with SHOB, hypoxia, chest CT neg for PE, elevated Troponins x3. Begin IV Heparin infusion for possible ACS, more likely CHF exacerbation/Myositis per Cards.   Note low platelets (91K)  Goal of Therapy:  Heparin level 0.3-0.7 units/ml Monitor platelets by anticoagulation  protocol: Yes   Plan:   Heparin bolus 3000 units, infusion at 1000 units/hr  Check Hep level in 6 hr (2300)  Cards Note: doubt ACS, and with low platelets, low threshold for Heparin  Minda Ditto PharmD Pager [phone] 05/28/2016, 6:27 PM

## 2016-06-06 NOTE — ED Notes (Signed)
Failed attempt to collect Lactic

## 2016-06-06 NOTE — ED Notes (Signed)
Patient placed on O2 ^ l/min via [place] -Sats decreased to 84%. Patient placed on NRB-10L/min

## 2016-06-06 NOTE — H&P (Signed)
History and Physical    Danielle [name] VEH:[id number] DOB: 1955/05/16 DOA: 06/08/2016  PCP: Glo Herring, MD    Patient coming from: Skilled nursing facility   Chief Complaint: Worsening dyspnea.  HPI: Danielle [name] is a 61 y.o. female with medical history significant of polymyositis associated with autoimmune disease, condition that was recently diagnosed early this month at [facility], she was discharged on April 20, with a long taper steroids to skilled nursing facility. She was treated with IV immunoglobulin and steroids.  She has been developing worsening dyspnea since her discharge from hospital, moderate to severe intensity, worse with exertion, no improving factors, associated with orthopnea and PND. The dyspnea has been worsening to the point where she is dyspneic with minimal efforts. Today the patient was noted to be severely hypoxic in the nursing facility and she was transferred to the hospital for further evaluation.   ED Course: Patient was found to be volume overloaded, severely hypoxic, placed on a nonrebreather. Elevated troponins, patient started on heparin drip. Cardiology was consulted.   Review of Systems:  1. General. Generalized weakness, no fevers or chills 2. ENT no runny nose or sore throat 3. Pulmonary no shortness of breath, cough or hemoptysis 4. Cardiovascular no angina, claudication or syncope, positive for PND, orthopnea and lower extremity edema. 5. Gastrointestinal no nausea vomiting or diarrhea 6. Hematology no easy bruisability or frequent infections 7. Endocrine a tremors, heat or cold intolerance 8. Dermatology no rashes 9. Urology no dysuria or increased urinary frequency 10. Neurology no seizures or paresthesias.  Past Medical History:  Diagnosis Date  . Borderline hyperlipidemia    managed with diet and exercise  . Breast cancer ([place]) 08/17/2011  . Cancer ([place])   . Cough    smokers cough  . Fatty infiltration of liver  05/13/2013  . Gastric ulcer 09/17/2011   not a current issue  . History of seronegative inflammatory arthritis   . Hypertension   . Infiltrating ductal carcinoma of left female breast ([place]) 08/17/2011  . 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 Myositis   . OA (osteoarthritis) of foot   . Port catheter in place 05/13/2012  . Shortness of breath dyspnea   . Stress fracture of foot     Past Surgical History:  Procedure Laterality Date  . AXILLARY LYMPH NODE DISSECTION  06/04/2011   Procedure: AXILLARY LYMPH NODE DISSECTION;  Surgeon: Donato Heinz, MD;  Location: [facility];  Service: General;  Laterality: Right;  . MASTECTOMY, PARTIAL  06/04/2011   Procedure: MASTECTOMY PARTIAL;  Surgeon: Donato Heinz, MD;  Location: [facility];  Service: General;  Laterality: Right;  . MUSCLE BIOPSY Right 09/08/2015   Procedure: RIGHT THIGH MUSCLE BIOPSY;  Surgeon: Georganna Skeans, MD;  Location: [place];  Service: General;  Laterality: Right;  . PORT-A-CATH REMOVAL Left 03/22/2014   Procedure: MINOR REMOVAL PORT-A-CATH;  Surgeon: Jamesetta So, MD;  Location: [facility];  Service: General;  Laterality: Left;  . PORTACATH PLACEMENT  07/23/2011   Procedure: INSERTION PORT-A-CATH;  Surgeon: Donato Heinz, MD;  Location: [facility];  Service: General;  Laterality: Left;  Left Subclavian  . SKIN BIOPSY  12/19/2011   Procedure: BIOPSY SKIN;  Surgeon: Donato Heinz, MD;  Location: [facility];  Service: General;  Laterality: Right;  In Minor Room  . TUBAL LIGATION  1980     reports that she has quit smoking. Her smoking use included Cigarettes. She has  a 60.00 pack-year smoking history. She has never used smokeless tobacco. She reports that she does not drink alcohol or use drugs.  Allergies  Allergen Reactions  . Bactrim [Sulfamethoxazole-Trimethoprim] Nausea And Vomiting and Other (See Comments)    Reaction:  Fever and chills   . Cephalexin Palpitations    Family History  Problem Relation Age  of Onset  . Heart disease Mother   . Diabetes Mother   . Stroke Mother   . Heart disease Father   . Diabetes Father   . Coronary artery disease Father     blocked arteries around heart  . Cancer Sister     breast cancer  . Osteoarthritis Sister   . Anesthesia problems Neg Hx   . Malignant hyperthermia Neg Hx   . Pseudochol deficiency Neg Hx    Unacceptable: Noncontributory, unremarkable, or negative. Acceptable: Family history reviewed and not pertinent (If you reviewed it)  Prior to Admission medications   Medication Sig Start Date End Date Taking? Authorizing Provider  acetaminophen (TYLENOL) 325 MG tablet Take 650 mg by mouth every 6 (six) hours as needed for mild pain or moderate pain.    Yes Historical Provider, MD  albuterol (ACCUNEB) 0.63 MG/3ML nebulizer solution Take 1 ampule by nebulization every 6 (six) hours as needed for wheezing.   Yes Historical Provider, MD  calcium carbonate (OSCAL) 1500 (600 Ca) MG TABS tablet Take 600 mg of elemental calcium by mouth 2 (two) times daily with a meal.   Yes Historical Provider, MD  HYDROcodone-acetaminophen (NORCO/VICODIN) 5-325 MG tablet Take 1 tablet by mouth at bedtime as needed for moderate pain.   Yes Historical Provider, MD  lidocaine (LIDODERM) 5 % Place 1 patch onto the skin daily. Remove & Discard patch within 12 hours or as directed by MD Apply to most painful area for up to 12 hours in a 24 hour period.   Yes Historical Provider, MD  lisinopril (PRINIVIL,ZESTRIL) 20 MG tablet Take 20 mg by mouth daily.   Yes Historical Provider, MD  methocarbamol (ROBAXIN) 500 MG tablet Take 250 mg by mouth 3 (three) times daily as needed for muscle spasms.   Yes Historical Provider, MD  pantoprazole (PROTONIX) 40 MG tablet Take 40 mg by mouth daily before breakfast.    Yes Historical Provider, MD  predniSONE (DELTASONE) 10 MG tablet Take 10-60 mg by mouth daily with breakfast. Pt is to take as a taper:  Six tablets daily for two weeks, five  tablets daily for two weeks, and four tablets daily until follow-up appointment with Dr. Rozelle Logan. 06/01/16  Yes Historical Provider, MD    Physical Exam: Vitals:   05/18/2016 1230 05/13/2016 1300 05/14/2016 1330 05/20/2016 1519  BP: 115/75 112/76 110/80 120/78  Pulse: 92 94 95 96  Resp: (!) 23 20 (!) 27 20  Temp:      TempSrc:      SpO2: 100% 100% 100% 100%  Weight:      Height:        Constitutional: deconditioned and ill looking appearing Vitals:   05/19/2016 1230 05/22/2016 1300 05/31/2016 1330 06/05/2016 1519  BP: 115/75 112/76 110/80 120/78  Pulse: 92 94 95 96  Resp: (!) 23 20 (!) 27 20  Temp:      TempSrc:      SpO2: 100% 100% 100% 100%  Weight:      Height:       Eyes: PERRL, lids and conjunctivae pale with no icterus.  ENMT: Mucous membranes are dry. Posterior  pharynx clear of any exudate or lesions.Normal dentition.  Neck: normal, supple, no masses, no thyromegaly Respiratory: decreased breath sounds bilaterally, with scattered rales and rhonchi. Positive accessory muscle use and increased work of breathing.   Cardiovascular: Positive moderate JVD, Regular rate and rhythm, no murmurs / rubs / gallops. + pitting edema. 2+ pedal pulses. No carotid bruits.  Abdomen: no tenderness, no masses palpated. No hepatosplenomegaly. Bowel sounds positive.  Musculoskeletal: no clubbing / cyanosis. No joint deformity upper and lower extremities. Good ROM, no contractures. Normal muscle tone.  Generalized weakness.  Skin: no rashes, lesions, ulcers. No induration Neurologic: CN 2-12 grossly intact. Sensation intact, DTR normal. Strength 5/5 in all 4.     Labs on Admission: I have personally reviewed following labs and imaging studies  CBC:  Recent Labs Lab 05/13/2016 05/21/2016 1148  WBC 11.3 8.6  NEUTROABS  --  6.0  HGB 10.5* 10.5*  HCT 30* 31.9*  MCV  --  88.4  PLT 106* 91*   Basic Metabolic Panel:  Recent Labs Lab 05/25/2016 05/28/2016 1148  NA 139 138  K 3.9 3.4*  CL  --  103  CO2   --  22  GLUCOSE  --  83  BUN 25* 25*  CREATININE 0.4* 0.55  CALCIUM  --  8.8*   GFR: Estimated Creatinine Clearance: 68.9 mL/min (by C-G formula based on SCr of 0.55 mg/dL). Liver Function Tests:  Recent Labs Lab 05/29/2016 1148  AST 101*  ALT 92*  ALKPHOS 147*  BILITOT 1.2  PROT 6.8  ALBUMIN 3.1*   No results for input(s): LIPASE, AMYLASE in the last 168 hours. No results for input(s): AMMONIA in the last 168 hours. Coagulation Profile: No results for input(s): INR, PROTIME in the last 168 hours. Cardiac Enzymes:  Recent Labs Lab 06/10/2016 1148 05/25/2016 1451  TROPONINI 0.65* 0.68*   BNP (last 3 results) No results for input(s): PROBNP in the last 8760 hours. HbA1C: No results for input(s): HGBA1C in the last 72 hours. CBG: No results for input(s): GLUCAP in the last 168 hours. Lipid Profile: No results for input(s): CHOL, HDL, LDLCALC, TRIG, CHOLHDL, LDLDIRECT in the last 72 hours. Thyroid Function Tests: No results for input(s): TSH, T4TOTAL, FREET4, T3FREE, THYROIDAB in the last 72 hours. Anemia Panel: No results for input(s): VITAMINB12, FOLATE, FERRITIN, TIBC, IRON, RETICCTPCT in the last 72 hours. Urine analysis:    Component Value Date/Time   COLORURINE YELLOW 05/14/2016 1642   APPEARANCEUR HAZY (A) 05/14/2016 1642   LABSPEC 1.014 05/14/2016 1642   PHURINE 5.0 05/14/2016 1642   GLUCOSEU NEGATIVE 05/14/2016 1642   HGBUR LARGE (A) 05/14/2016 1642   BILIRUBINUR NEGATIVE 05/14/2016 1642   KETONESUR 5 (A) 05/14/2016 1642   PROTEINUR NEGATIVE 05/14/2016 1642   UROBILINOGEN 0.2 08/17/2011 1358   NITRITE NEGATIVE 05/14/2016 1642   LEUKOCYTESUR NEGATIVE 05/14/2016 1642    Radiological Exams on Admission: Ct Angio Chest Pe W And/or Wo Contrast  Result Date: 06/03/2016 CLINICAL DATA:  Shortness of breath and hypoxia EXAM: CT ANGIOGRAPHY CHEST WITH CONTRAST TECHNIQUE: Multidetector CT imaging of the chest was performed using the standard protocol during bolus  administration of intravenous contrast. Multiplanar CT image reconstructions and MIPs were obtained to evaluate the vascular anatomy. CONTRAST:  82 mL Isovue 370. COMPARISON:  06/06/2016, 09/05/2015 FINDINGS: Cardiovascular: Diffuse atherosclerotic changes of the thoracic aorta and its branches are noted. No evidence of dissection is seen. Mild dilatation of the ascending aorta 4 cm is noted. The pulmonary artery demonstrates a  normal branching pattern. No intraluminal filling defect is identified to suggest pulmonary embolism. Mild coronary calcifications are noted. Mild cardiac enlargement is seen. No pericardial effusion is noted. Mediastinum/Nodes: The thoracic inlet is within normal limits. No significant hilar or mediastinal adenopathy is noted. Scattered small lymph nodes are noted in the prevascular region. Mildly calcified lymph node nodes are seen in the subcarinal region as well as within the hila bilaterally. These would be consistent with prior granulomatous disease. The esophagus as visualize is within normal limits. Lungs/Pleura: Bilateral pleural effusions are noted right greater than left. Diffuse emphysematous changes are seen throughout both lungs. Patchy infiltrative changes are noted in the bases bilaterally slightly greater on the right than the left. Some of these have a somewhat nodular appearance particularly in the right lower lobe on image number 36 of series 11. An 8 mm noncalcified nodule is noted in the left upper lobe along the fissure best seen on image number 17 of series 11. Scattered calcified granulomas are noted as well. Upper Abdomen: The visualized upper abdomen shows scattered calcified splenic granulomas. Musculoskeletal: Postsurgical changes are noted in the right breast consistent with the patient clinical history. Mottled appearance to the bony structures is noted diffusely but most notable within the thoracic spine. A healing fracture of the right fourth rib is noted  posterolaterally. In expansile lesion is noted involving the central portion of the first rib adjacent to the thoracic spine. Some scattered lytic lesions are noted within the scapula on the left. Compression deformities of C7, T1, T10 and T11 are noted of uncertain age. Review of the MIP images confirms the above findings. IMPRESSION: Bilateral pleural effusions right greater than left as well as patchy infiltrative changes in the bases with some nodular changes seen. Although these may simply represent pneumonic infiltrates the possibility of neoplastic involvement deserves consideration. The bony structures demonstrates significant lytic lesions most notable in the thoracic spine with a T11 and T10 compression deformity as well as mild height loss at C7 and T1. These changes are consistent with metastatic disease. Changes consistent with prior granulomatous disease. Electronically Signed   By: Inez Catalina M.D.   On: 05/28/2016 16:02   Dg Chest Port 1 View  Result Date: 05/24/2016 CLINICAL DATA:  61 year old former smoker presenting with acute onset of cough. Hypoxia is present. Personal history of right breast cancer. EXAM: PORTABLE CHEST 1 VIEW COMPARISON:  05/18/2016, 05/16/2016 and earlier. FINDINGS: Cardiac silhouette moderately enlarged. Mild-to-moderate diffuse interstitial pulmonary edema. Chronic persistent bilateral pleural effusions, moderate to large in size, unchanged. Associated dense consolidation in the lower lobes. Surgical clips in the right axilla from prior node dissection. IMPRESSION: 1. Acute mild-to-moderate CHF, with stable cardiomegaly and interstitial pulmonary edema. 2. Chronic persistent moderate to large bilateral pleural effusions, right greater than left, with associated chronic passive atelectasis in the lower lobes (favored over pneumonia). Electronically Signed   By: Evangeline Dakin M.D.   On: 05/30/2016 11:13    EKG: Independently reviewed. Normal sinus, rhythm.    Assessment/Plan Active Problems:   Heart failure [facility])   This is a 61 year old female who presented with worsening dyspnea and hypoxemia, recently discharged from [facility] after being diagnosed with polymyositis. For last 5 days her symptoms have been worsening to the point where she is dyspneic with minimal efforts, it has been associated with PND, orthopnea and lower extremity edema. Her blood pressure 130/72, heart rate 93, respiratory rate 22, oxygen saturation 91% of 100% FiO2. Her mucosa is dry, positive  JVD, diffuse scattered rales and  rhonchi, positive lower extremity edema. Sodium 138, potassium 3.4, chloride 103, bicarbonate 22, glucose 83, BUN 25, creatinine 0.55, AST 101, ALT 92, BNP 1618, troponin 0.65, white count 8.6, hemoglobin 10.5, hematocrit 31.9, platelets 91. Chest x-ray personally reviewed, bilateral increase interstitial infiltrates, bilateral pleural effusions. CT chest, lung windows with bilateral pleural effusions, more right and left.   The patient will be admitted to the hospital with the working diagnosis of acute hypoxic respiratory failure due to cardiogenic pulmonary edema due to suspected diastolic heart failure decompensation complicated myocardial infarction type II.  1. Acute hypoxic respiratory failure. Patient will be admitted to the stepdown unit, she will be placed on noninvasive mechanical ventilation, BiPAP 12/5. Will start diuresis with furosemide 40 mg intravenously twice daily, to target negative fluid balance. Continue oximetry monitoring.  2. Diastolic heart failure decompensation with MI  Type 2. Will continue diuresis with furosemide, further workup with echocardiography. Suspected demand ischemia, patient has been placed on heparin drip per Cardiology consultation. Will continue to follow up on troponins.  3.  Hypertension. Will hold on antihypertensive agents for about avoid hypotension.  4. Polymyositis. Patient will continue on systemic steroids,  prednisone 60 g daily. Will need to rule out polymyositis affecting the myocardium, and producing nonischemic cardiomyopathy.   5. Hypokalemia. Patient received potassium chloride, target potassium of 4  6. Thrombocytopenia. Suspected to be reactive follow call count in am.    DVT prophylaxis: heparin IV  Code Status: Full  Family Communication: No family at the bedside  Disposition Plan: SNF Consults called: Cardiology Admission status: Inpatient   Samiksha Pellicano Gerome Apley MD [facility] Pager [phone]  If 7PM-7AM, please contact night-coverage [email or web address] Password [email or web address]  05/26/2016, 4:45 PM

## 2016-06-06 NOTE — ED Notes (Signed)
Elevated troponin reported to RN Adela Lank and MD Kathrynn Humble

## 2016-06-06 NOTE — ED Notes (Signed)
ICU called for 20 minute timer.

## 2016-06-06 NOTE — ED Notes (Signed)
Danielle [name]/daughter [phone]

## 2016-06-06 NOTE — ED Notes (Addendum)
CT notified that the patient had a #20 IV.

## 2016-06-06 NOTE — Progress Notes (Signed)
Patient refuses Bipap. Patient educated. Patient is currently on nasal cannula high flow maintaining saturations. Will continue to monitor patient.

## 2016-06-06 NOTE — ED Provider Notes (Signed)
[facility] DEPT Provider Note   CSN: [id number] Arrival date & time: 05/22/2016  0919     History   Chief Complaint Chief Complaint  Patient presents with  . Shortness of Breath    HPI Danielle [name] is a 61 y.o. female.  HPI 61 y.o.femalewith hx of myositis (been on high dose steroids and getting infusion at [facility]) and of HLD, PUD, CHF, breast CA who comes in with cc of DIB. Pt was admitted recently to [facility] with multiple issues - but she had AKI and CHF exacerbation and was discharged with O2. Pt sent here from SNF for hypoxia. Per SNF - at room air pt's saturations were 62%, and with 2 Liters 82%. EMS placed pt on 6 L and that increased the sats to 92%.  Pt reports that she has had some cough the last few days, but no fevers, chest pain, wheezing. Pt has had some shortness of breath over the past few days. Pt has no hx of PE. Although she has been hardly moving around since her admission at [facility].  Pt has breast CA, not getting chemo at the moment.    Past Medical History:  Diagnosis Date  . Borderline hyperlipidemia    managed with diet and exercise  . Breast cancer ([place]) 08/17/2011  . Cancer ([place])   . Cough    smokers cough  . Fatty infiltration of liver 05/13/2013  . Gastric ulcer 09/17/2011   not a current issue  . History of seronegative inflammatory arthritis   . Hypertension   . Infiltrating ductal carcinoma of left female breast ([place]) 08/17/2011  . Infiltrating ductal carcinoma of right female breast ([place]) 08/17/2011  . Myositis   . OA (osteoarthritis) of foot   . Port catheter in place 05/13/2012  . Shortness of breath dyspnea   . Stress fracture of foot     Patient Active Problem List   Diagnosis Date Noted  . Heart failure ([place]) 06/10/2016  . Acute on chronic respiratory failure with hypoxia ([place]) 06/05/2016  . Hypotension 06/05/2016  . Metastatic breast cancer ([place]) 06/05/2016  . GERD (gastroesophageal reflux disease) 06/05/2016  . Aspiration into airway  06/05/2016  . Myositis   . Oral thrush 05/22/2016  . Protein-calorie malnutrition, severe 05/15/2016  . Adrenal insufficiency ([place]) 05/14/2016  . CAP (community acquired pneumonia) 05/14/2016  . AKI (acute kidney injury) ([place]) 05/14/2016  . Hypokalemia 05/14/2016  . Hyponatremia 05/14/2016  . 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 ILD (interstitial lung disease) ([place]) 11/16/2015  . Inflammatory arthritis 11/16/2015  . Fatty infiltration of liver 05/13/2013  . Stiffness of joint, not elsewhere classified, other specified site 01/27/2013  . Lymphedema of arm 01/27/2013  . Stress fracture of foot 06/04/2012  . 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 Pain in joint, shoulder region 04/15/2012  . Muscle tightness 04/15/2012  . Gastric ulcer 09/17/2011  . Dehydration 08/17/2011  . Infiltrating ductal carcinoma of right female breast ([place]) 08/17/2011  . Hypertension 08/17/2011    Past Surgical History:  Procedure Laterality Date  . AXILLARY LYMPH NODE DISSECTION  06/04/2011   Procedure: AXILLARY LYMPH NODE DISSECTION;  Surgeon: Donato Heinz, MD;  Location: [facility]S;  Service: General;  Laterality: Right;  . MASTECTOMY, PARTIAL  06/04/2011   Procedure: MASTECTOMY PARTIAL;  Surgeon: Donato Heinz, MD;  Location: [facility];  Service: General;  Laterality: Right;  . MUSCLE BIOPSY Right 09/08/2015   Procedure: RIGHT THIGH MUSCLE BIOPSY;  Surgeon: Georganna Skeans,  MD;  Location: [facility];  Service: General;  Laterality: Right;  . PORT-A-CATH REMOVAL Left 03/22/2014   Procedure: MINOR REMOVAL PORT-A-CATH;  Surgeon: Jamesetta So, MD;  Location: [facility];  Service: General;  Laterality: Left;  . PORTACATH PLACEMENT  07/23/2011   Procedure: INSERTION PORT-A-CATH;  Surgeon: Donato Heinz, MD;  Location: [facility];  Service: General;  Laterality: Left;  Left Subclavian  . SKIN BIOPSY  12/19/2011   Procedure: BIOPSY SKIN;  Surgeon: Donato Heinz, MD;  Location: [facility];  Service:  General;  Laterality: Right;  In Minor Room  . TUBAL LIGATION  1980    OB History    No data available       Home Medications    Prior to Admission medications   Medication Sig Start Date End Date Taking? Authorizing Provider  acetaminophen (TYLENOL) 325 MG tablet Take 650 mg by mouth every 6 (six) hours as needed for mild pain or moderate pain.    Yes Historical Provider, MD  albuterol (ACCUNEB) 0.63 MG/3ML nebulizer solution Take 1 ampule by nebulization every 6 (six) hours as needed for wheezing.   Yes Historical Provider, MD  calcium carbonate (OSCAL) 1500 (600 Ca) MG TABS tablet Take 600 mg of elemental calcium by mouth 2 (two) times daily with a meal.   Yes Historical Provider, MD  HYDROcodone-acetaminophen (NORCO/VICODIN) 5-325 MG tablet Take 1 tablet by mouth at bedtime as needed for moderate pain.   Yes Historical Provider, MD  lidocaine (LIDODERM) 5 % Place 1 patch onto the skin daily. Remove & Discard patch within 12 hours or as directed by MD Apply to most painful area for up to 12 hours in a 24 hour period.   Yes Historical Provider, MD  lisinopril (PRINIVIL,ZESTRIL) 20 MG tablet Take 20 mg by mouth daily.   Yes Historical Provider, MD  methocarbamol (ROBAXIN) 500 MG tablet Take 250 mg by mouth 3 (three) times daily as needed for muscle spasms.   Yes Historical Provider, MD  pantoprazole (PROTONIX) 40 MG tablet Take 40 mg by mouth daily before breakfast.    Yes Historical Provider, MD  predniSONE (DELTASONE) 10 MG tablet Take 10-60 mg by mouth daily with breakfast. Pt is to take as a taper:  Six tablets daily for two weeks, five tablets daily for two weeks, and four tablets daily until follow-up appointment with Dr. Rozelle Logan. 06/01/16  Yes Historical Provider, MD    Family History Family History  Problem Relation Age of Onset  . Heart disease Mother   . Diabetes Mother   . Stroke Mother   . Heart disease Father   . Diabetes Father   . Coronary artery disease Father      blocked arteries around heart  . Cancer Sister     breast cancer  . Osteoarthritis Sister   . Anesthesia problems Neg Hx   . Malignant hyperthermia Neg Hx   . Pseudochol deficiency Neg Hx     Social History Social History  Substance Use Topics  . Smoking status: Former Smoker    Packs/day: 1.50    Years: 40.00    Types: Cigarettes  . Smokeless tobacco: Never Used     Comment: 0.5ppd 10/03/15  . Alcohol use No     Comment: 1 glass of wine and 7 cans of beer a week. Pt denies any alcohol issues; no alcohol use in over 3 months     Allergies   Bactrim [sulfamethoxazole-trimethoprim] and Cephalexin   Review of Systems  Review of Systems  All other systems reviewed and are negative.    Physical Exam Updated Vital Signs BP 120/78 (BP Location: Left Leg)   Pulse 96   Temp 97.9 F (36.6 C) (Oral)   Resp 20   Ht 5\' 4"  (1.626 m)   Wt 145 lb (65.8 kg)   SpO2 100%   BMI 24.89 kg/m   Physical Exam  Constitutional: She is oriented to person, place, and time. She appears well-developed and well-nourished.  HENT:  Head: Normocephalic and atraumatic.  Eyes: EOM are normal. Pupils are equal, round, and reactive to light.  Neck: Neck supple. No JVD present.  Cardiovascular: Normal rate, regular rhythm and normal heart sounds.   No murmur heard. Pulmonary/Chest: Effort normal. No respiratory distress. She has rales.  Bibasilar rales, worse on the L side  Abdominal: Soft. She exhibits no distension. There is no tenderness. There is no rebound and no guarding.  Musculoskeletal: She exhibits no edema or tenderness.  Neurological: She is alert and oriented to person, place, and time.  Skin: Skin is warm and dry.  Nursing note and vitals reviewed.    ED Treatments / Results  Labs (all labs ordered are listed, but only abnormal results are displayed) Labs Reviewed  COMPREHENSIVE METABOLIC PANEL - Abnormal; Notable for the following:       Result Value   Potassium 3.4 (*)     BUN 25 (*)    Calcium 8.8 (*)    Albumin 3.1 (*)    AST 101 (*)    ALT 92 (*)    Alkaline Phosphatase 147 (*)    All other components within normal limits  CBC WITH DIFFERENTIAL/PLATELET - Abnormal; Notable for the following:    RBC 3.61 (*)    Hemoglobin 10.5 (*)    HCT 31.9 (*)    RDW 18.3 (*)    Platelets 91 (*)    All other components within normal limits  BRAIN NATRIURETIC PEPTIDE - Abnormal; Notable for the following:    B Natriuretic Peptide 618.0 (*)    All other components within normal limits  TROPONIN I - Abnormal; Notable for the following:    Troponin I 0.65 (*)    All other components within normal limits  TROPONIN I - Abnormal; Notable for the following:    Troponin I 0.68 (*)    All other components within normal limits  I-STAT TROPOININ, ED - Abnormal; Notable for the following:    Troponin i, poc 0.73 (*)    All other components within normal limits  URINALYSIS, ROUTINE W REFLEX MICROSCOPIC  I-STAT CG4 LACTIC ACID, ED  I-STAT CG4 LACTIC ACID, ED    EKG  EKG Interpretation  Date/Time:  Wednesday June 06 2016 09:34:35 EDT Ventricular Rate:  97 PR Interval:    QRS Duration: 91 QT Interval:  416 QTC Calculation: 529 R Axis:   -63 Text Interpretation:  Sinus rhythm Left anterior fascicular block Low voltage, extremity leads Abnormal R-wave progression, late transition Nonspecific T abnormalities, anterior leads Prolonged QT interval No acute changes No significant change since last tracing Confirmed by Kathrynn Humble, MD, Thelma Comp [phone]) on 05/25/2016 9:40:05 AM Also confirmed by Kathrynn Humble, MD, Thelma Comp [phone]), editor [place] CT, Leda Gauze [phone])  on 06/03/2016 11:25:31 AM       Radiology Ct Angio Chest Pe W And/or Wo Contrast  Result Date: 05/21/2016 CLINICAL DATA:  Shortness of breath and hypoxia EXAM: CT ANGIOGRAPHY CHEST WITH CONTRAST TECHNIQUE: Multidetector CT imaging of the chest was performed  using the standard protocol during bolus administration of intravenous  contrast. Multiplanar CT image reconstructions and MIPs were obtained to evaluate the vascular anatomy. CONTRAST:  82 mL Isovue 370. COMPARISON:  05/31/2016, 09/05/2015 FINDINGS: Cardiovascular: Diffuse atherosclerotic changes of the thoracic aorta and its branches are noted. No evidence of dissection is seen. Mild dilatation of the ascending aorta 4 cm is noted. The pulmonary artery demonstrates a normal branching pattern. No intraluminal filling defect is identified to suggest pulmonary embolism. Mild coronary calcifications are noted. Mild cardiac enlargement is seen. No pericardial effusion is noted. Mediastinum/Nodes: The thoracic inlet is within normal limits. No significant hilar or mediastinal adenopathy is noted. Scattered small lymph nodes are noted in the prevascular region. Mildly calcified lymph node nodes are seen in the subcarinal region as well as within the hila bilaterally. These would be consistent with prior granulomatous disease. The esophagus as visualize is within normal limits. Lungs/Pleura: Bilateral pleural effusions are noted right greater than left. Diffuse emphysematous changes are seen throughout both lungs. Patchy infiltrative changes are noted in the bases bilaterally slightly greater on the right than the left. Some of these have a somewhat nodular appearance particularly in the right lower lobe on image number 36 of series 11. An 8 mm noncalcified nodule is noted in the left upper lobe along the fissure best seen on image number 17 of series 11. Scattered calcified granulomas are noted as well. Upper Abdomen: The visualized upper abdomen shows scattered calcified splenic granulomas. Musculoskeletal: Postsurgical changes are noted in the right breast consistent with the patient clinical history. Mottled appearance to the bony structures is noted diffusely but most notable within the thoracic spine. A healing fracture of the right fourth rib is noted posterolaterally. In expansile  lesion is noted involving the central portion of the first rib adjacent to the thoracic spine. Some scattered lytic lesions are noted within the scapula on the left. Compression deformities of C7, T1, T10 and T11 are noted of uncertain age. Review of the MIP images confirms the above findings. IMPRESSION: Bilateral pleural effusions right greater than left as well as patchy infiltrative changes in the bases with some nodular changes seen. Although these may simply represent pneumonic infiltrates the possibility of neoplastic involvement deserves consideration. The bony structures demonstrates significant lytic lesions most notable in the thoracic spine with a T11 and T10 compression deformity as well as mild height loss at C7 and T1. These changes are consistent with metastatic disease. Changes consistent with prior granulomatous disease. Electronically Signed   By: Inez Catalina M.D.   On: 05/19/2016 16:02   Dg Chest Port 1 View  Result Date: 06/08/2016 CLINICAL DATA:  61 year old former smoker presenting with acute onset of cough. Hypoxia is present. Personal history of right breast cancer. EXAM: PORTABLE CHEST 1 VIEW COMPARISON:  05/18/2016, 05/16/2016 and earlier. FINDINGS: Cardiac silhouette moderately enlarged. Mild-to-moderate diffuse interstitial pulmonary edema. Chronic persistent bilateral pleural effusions, moderate to large in size, unchanged. Associated dense consolidation in the lower lobes. Surgical clips in the right axilla from prior node dissection. IMPRESSION: 1. Acute mild-to-moderate CHF, with stable cardiomegaly and interstitial pulmonary edema. 2. Chronic persistent moderate to large bilateral pleural effusions, right greater than left, with associated chronic passive atelectasis in the lower lobes (favored over pneumonia). Electronically Signed   By: Evangeline Dakin M.D.   On: 06/05/2016 11:13    Procedures Procedures (including critical care time)  CRITICAL CARE Performed by:  Varney Biles   Total critical care time: 50  minutes  Critical care time was exclusive of separately billable procedures and treating other patients.  Critical care was necessary to treat or prevent imminent or life-threatening deterioration.  Critical care was time spent personally by me on the following activities: development of treatment plan with patient and/or surrogate as well as nursing, discussions with consultants, evaluation of patient's response to treatment, examination of patient, obtaining history from patient or surrogate, ordering and performing treatments and interventions, ordering and review of laboratory studies, ordering and review of radiographic studies, pulse oximetry and re-evaluation of patient's condition.   Medications Ordered in ED Medications  0.9 %  sodium chloride infusion (1,000 mLs Intravenous New Bag/Given 05/14/2016 1154)  aspirin chewable tablet 324 mg (324 mg Oral Given 06/10/2016 1220)  iopamidol (ISOVUE-370) 76 % injection (82 mLs  Contrast Given 05/29/2016 1528)     Initial Impression / Assessment and Plan / ED Course  I have reviewed the triage vital signs and the nursing notes.  Pertinent labs & imaging results that were available during my care of the patient were reviewed by me and considered in my medical decision making (see chart for details).     Pt comes in with cc of DIB. Pt has CHF hx, she is having rales - but the the hypoxia is out of proportion to the exam findings. Pneumonia seems less likely clinically.  We will get basic labs. Trops ordered, BNP ordered.  If CXR is not overtly concerning, we will have to consider getting a CT PE.  4:50 PM CT PE is neg. Trops are elevated x 2. ACS is still low in our differential. CT PE ordered, and is neg for PNA or PE. Cancer possible. Results from the ER workup discussed with the patient face to face and all questions answered to the best of my ability.  Hospitalist consulted. Cards  consulted.  WE tried to wean pt off of O2 - and her O2 sats dropped to 89% on 6L.    Final Clinical Impressions(s) / ED Diagnoses   Final diagnoses:  Acute hypoxemic respiratory failure ([place])  NSTEMI (non-ST elevated myocardial infarction) ([place])  Acute on chronic diastolic heart failure [facility])    New Prescriptions New Prescriptions   No medications on file     Varney Biles, MD 05/15/2016 1652

## 2016-06-06 NOTE — ED Notes (Signed)
Pt said that she was not up to getting a urine sample.

## 2016-06-06 NOTE — ED Notes (Signed)
Only one bag of heparin hung. Scanned twice.

## 2016-06-06 NOTE — ED Triage Notes (Signed)
Per EMS- patient resides at [facility]. Patient had room airs sats at 62%. Patient was given O2 2L/min via [place] and an Albuterol inhaler. Sasts increased to 82% prior to EMS arrival. EMS increased O2 to 6L/min via [place] and sasts increased to 90-92%. Lungs clear per EMS.

## 2016-06-07 ENCOUNTER — Inpatient Hospital Stay (HOSPITAL_COMMUNITY): Payer: BLUE CROSS/BLUE SHIELD

## 2016-06-07 DIAGNOSIS — M3320 Polymyositis, organ involvement unspecified: Secondary | ICD-10-CM

## 2016-06-07 DIAGNOSIS — J189 Pneumonia, unspecified organism: Secondary | ICD-10-CM

## 2016-06-07 DIAGNOSIS — I469 Cardiac arrest, cause unspecified: Secondary | ICD-10-CM

## 2016-06-07 DIAGNOSIS — J9690 Respiratory failure, unspecified, unspecified whether with hypoxia or hypercapnia: Secondary | ICD-10-CM | POA: Diagnosis present

## 2016-06-07 DIAGNOSIS — J9601 Acute respiratory failure with hypoxia: Secondary | ICD-10-CM

## 2016-06-07 DIAGNOSIS — M359 Systemic involvement of connective tissue, unspecified: Secondary | ICD-10-CM

## 2016-06-07 DIAGNOSIS — I214 Non-ST elevation (NSTEMI) myocardial infarction: Secondary | ICD-10-CM

## 2016-06-07 DIAGNOSIS — Z7189 Other specified counseling: Secondary | ICD-10-CM

## 2016-06-07 LAB — HEPARIN LEVEL (UNFRACTIONATED): HEPARIN UNFRACTIONATED: 0.74 [IU]/mL — AB (ref 0.30–0.70)

## 2016-06-07 LAB — CBC
HCT: 30.8 % — ABNORMAL LOW (ref 36.0–46.0)
Hemoglobin: 10.5 g/dL — ABNORMAL LOW (ref 12.0–15.0)
MCH: 30.3 pg (ref 26.0–34.0)
MCHC: 34.1 g/dL (ref 30.0–36.0)
MCV: 88.8 fL (ref 78.0–100.0)
PLATELETS: 85 10*3/uL — AB (ref 150–400)
RBC: 3.47 MIL/uL — AB (ref 3.87–5.11)
RDW: 18.8 % — ABNORMAL HIGH (ref 11.5–15.5)
WBC: 9.3 10*3/uL (ref 4.0–10.5)

## 2016-06-07 LAB — URINALYSIS, ROUTINE W REFLEX MICROSCOPIC
BACTERIA UA: NONE SEEN
Bilirubin Urine: NEGATIVE
Glucose, UA: NEGATIVE mg/dL
KETONES UR: 5 mg/dL — AB
Leukocytes, UA: NEGATIVE
NITRITE: NEGATIVE
PH: 5 (ref 5.0–8.0)
PROTEIN: NEGATIVE mg/dL
Specific Gravity, Urine: 1.013 (ref 1.005–1.030)

## 2016-06-07 LAB — BASIC METABOLIC PANEL
Anion gap: 12 (ref 5–15)
BUN: 23 mg/dL — ABNORMAL HIGH (ref 6–20)
CALCIUM: 9 mg/dL (ref 8.9–10.3)
CHLORIDE: 106 mmol/L (ref 101–111)
CO2: 20 mmol/L — ABNORMAL LOW (ref 22–32)
CREATININE: 0.45 mg/dL (ref 0.44–1.00)
GFR calc non Af Amer: >60 mL/min (ref >60)
Glucose, Bld: 78 mg/dL (ref 65–99)
Potassium: 4.3 mmol/L (ref 3.5–5.1)
SODIUM: 138 mmol/L (ref 135–145)

## 2016-06-07 LAB — BLOOD GAS, ARTERIAL
Acid-base deficit: 3.9 mmol/L — ABNORMAL HIGH (ref 0.0–2.0)
Bicarbonate: 18.7 mmol/L — ABNORMAL LOW (ref 20.0–28.0)
Drawn by: 331471
O2 CONTENT: 15 L/min
O2 Saturation: 90.1 %
PATIENT TEMPERATURE: 98.6
PO2 ART: 59.6 mmHg — AB (ref 83.0–108.0)
pCO2 arterial: 26.3 mmHg — ABNORMAL LOW (ref 32.0–48.0)
pH, Arterial: 7.466 — ABNORMAL HIGH (ref 7.350–7.450)

## 2016-06-07 MED ORDER — FUROSEMIDE 10 MG/ML IJ SOLN: 80.0000 mg | Freq: Three times a day (TID) | INTRAMUSCULAR | Status: DC

## 2016-06-07 MED ORDER — ASPIRIN 81 MG PO CHEW: 324.0000 mg | CHEWABLE_TABLET | ORAL | Status: DC

## 2016-06-07 MED ORDER — ENOXAPARIN SODIUM 40 MG/0.4ML ~~LOC~~ SOLN
40.0000 mg | SUBCUTANEOUS | Status: DC
Start: 2016-06-07 — End: 2016-06-08

## 2016-06-07 MED ORDER — LORAZEPAM 1 MG PO TABS
1.0000 mg | ORAL_TABLET | ORAL | Status: DC | PRN
Start: 2016-06-07 — End: 2016-06-07
  Administered 2016-06-07: 1 mg via ORAL
  Filled 2016-06-07: qty 2

## 2016-06-07 MED ORDER — FENTANYL CITRATE (PF) 100 MCG/2ML IJ SOLN
INTRAMUSCULAR | Status: AC
Start: 2016-06-07 — End: 2016-06-07
  Administered 2016-06-07: 100 ug
  Filled 2016-06-07: qty 2

## 2016-06-07 MED ORDER — FUROSEMIDE 10 MG/ML IJ SOLN
80.0000 mg | INTRAMUSCULAR | Status: AC
  Administered 2016-06-07: 80 mg via INTRAVENOUS
  Filled 2016-06-07: qty 8

## 2016-06-07 MED ORDER — ETOMIDATE 2 MG/ML IV SOLN
20.0000 mg | Freq: Once | INTRAVENOUS | Status: AC
  Administered 2016-06-07: 20 mg via INTRAVENOUS

## 2016-06-07 MED ORDER — EPINEPHRINE PF 1 MG/10ML IJ SOSY
PREFILLED_SYRINGE | INTRAMUSCULAR | Status: AC
  Filled 2016-06-07: qty 10

## 2016-06-07 MED ORDER — SODIUM CHLORIDE 0.9 % IV SOLN: 250.0000 mL | INTRAVENOUS | Status: DC | PRN

## 2016-06-07 MED ORDER — ROCURONIUM BROMIDE 50 MG/5ML IV SOLN: 70.0000 mg | Freq: Once | INTRAVENOUS | Status: DC

## 2016-06-07 MED ORDER — VANCOMYCIN HCL 500 MG IV SOLR
500.0000 mg | Freq: Two times a day (BID) | INTRAVENOUS | Status: DC
  Filled 2016-06-07 (×2): qty 500

## 2016-06-07 MED ORDER — METHYLPREDNISOLONE SODIUM SUCC 40 MG IJ SOLR: 30.0000 mg | Freq: Two times a day (BID) | INTRAMUSCULAR | Status: DC

## 2016-06-07 MED ORDER — FUROSEMIDE 10 MG/ML IJ SOLN
40.0000 mg | Freq: Three times a day (TID) | INTRAMUSCULAR | Status: DC
  Administered 2016-06-07: 40 mg via INTRAVENOUS
  Filled 2016-06-07: qty 4

## 2016-06-07 MED ORDER — BOOST / RESOURCE BREEZE PO LIQD: 1.0000 | Freq: Three times a day (TID) | ORAL | Status: DC

## 2016-06-07 MED ORDER — LORAZEPAM 0.5 MG PO TABS
0.5000 mg | ORAL_TABLET | Freq: Once | ORAL | Status: AC
Start: 2016-06-07 — End: 2016-06-07
  Administered 2016-06-07: 0.5 mg via ORAL
  Filled 2016-06-07: qty 1

## 2016-06-07 MED ORDER — PIPERACILLIN-TAZOBACTAM 3.375 G IVPB
3.3750 g | Freq: Three times a day (TID) | INTRAVENOUS | Status: DC
  Administered 2016-06-07: 3.375 g via INTRAVENOUS
  Filled 2016-06-07: qty 50

## 2016-06-07 MED ORDER — ADULT MULTIVITAMIN W/MINERALS CH: 1.0000 | ORAL_TABLET | Freq: Every day | ORAL | Status: DC

## 2016-06-07 MED ORDER — FAMOTIDINE IN NACL 20-0.9 MG/50ML-% IV SOLN: 20.0000 mg | INTRAVENOUS | Status: DC

## 2016-06-07 MED ORDER — ROCURONIUM BROMIDE 100 MG/10ML IV SOLN
60.0000 mg | Freq: Once | INTRAVENOUS | Status: AC
  Administered 2016-06-07: 60 mg via INTRAVENOUS

## 2016-06-07 MED ORDER — METHYLPREDNISOLONE SODIUM SUCC 40 MG IJ SOLR: 40.0000 mg | Freq: Four times a day (QID) | INTRAMUSCULAR | Status: DC

## 2016-06-07 MED ORDER — MIDAZOLAM HCL 2 MG/2ML IJ SOLN
INTRAMUSCULAR | Status: AC
  Administered 2016-06-07: 2 mg
  Filled 2016-06-07: qty 2

## 2016-06-07 MED ORDER — ASPIRIN 300 MG RE SUPP: 300.0000 mg | RECTAL | Status: DC

## 2016-06-07 MED FILL — Medication: Qty: 2 | Status: AC

## 2016-06-12 NOTE — Care Management Note (Signed)
Case Management Note  Patient Details  Name: Danielle [name] MRN: [id number] Date of Birth: 1955/03/31  Subjective/Objective:          Shortness of breath and increased wob/hfnc requiried          Action/Plan:  From SNF  Date:  Jul 04, 2016 Chart reviewed for concurrent status and case management needs. Will continue to follow patient progress. Discharge Planning: following for needs Expected discharge date: [id number] Velva Harman, BSN, [place], [place]   Expected Discharge Date:   (unknown)               Expected Discharge Plan:  [place]  In-House Referral:  Clinical Social Work  Discharge planning Services  CM Consult  Post Acute Care Choice:    Choice offered to:     DME Arranged:    DME Agency:     HH Arranged:    [place] Agency:     Status of Service:  In process, will continue to follow  If discussed at Long Length of Stay Meetings, dates discussed:    Additional Comments:  Leeroy Cha, RN 07/04/16, 10:09 AM

## 2016-06-12 NOTE — Progress Notes (Signed)
Pharmacy Antibiotic Note  Danielle [name] is a 61 y.o. female admitted on 05/20/2016 with recently diagnosed polymyositis at [facility] and was treated with IVIG and steroids (just discharged 06/01/16 to SNF with long steroid taper) PT SOB with very minimal exertion.   Pharmacy has been consulted for vancomycin and zosyn dosing for pna.  Plan: Vancomycin 500mg  IV q12h Zosyn 3.375g IV Q8H infused over 4hrs. Follow renal function, cultures and clinical course Vancomycin trough as needed.  Height: 5\' 4"  (162.6 cm) Weight: 141 lb 8.6 oz (64.2 kg) IBW/kg (Calculated) : 54.7  Temp (24hrs), Avg:97.5 F (36.4 C), Min:97.1 F (36.2 C), Max:98 F (36.7 C)   Recent Labs Lab 05/28/2016 05/23/2016 1148 05/25/2016 1159 05/23/2016 1449 Jun 16, 2016 0221  WBC 11.3 8.6  --   --  9.3  CREATININE 0.4* 0.55  --   --  0.45  LATICACIDVEN  --   --  1.65 1.35  --     Estimated Creatinine Clearance: 63.8 mL/min (by C-G formula based on SCr of 0.45 mg/dL).    Allergies  Allergen Reactions  . Bactrim [Sulfamethoxazole-Trimethoprim] Nausea And Vomiting and Other (See Comments)    Reaction:  Fever and chills   . Cephalexin Palpitations    Antimicrobials this admission: 4/26 vancomycin >> 4/26 zosyn >>   Microbiology results: 4/25 MRSA PCR: negative  Thank you for allowing pharmacy to be a part of this patient's care.  Dolly Rias RPh Jun 16, 2016, 7:17 PM Pager [phone]

## 2016-06-12 NOTE — Progress Notes (Signed)
At bedside to get consent for PICC and place.  Patient difficult to arouse and unable to lay with head of bed back due to respiratory instability.  Arby Barrette, RN and Dr. Cathlean Sauer at bedside.  Discussed that PICC will take 1.5 hours, patient will be draped completely and unable to assess respiratory status, patient unable to lay with head back to have access to upper arm area.  Planning to have CCM place central line due above.  Carolee Rota, RN VAST

## 2016-06-12 NOTE — Assessment & Plan Note (Signed)
ddx is ILD flare, Pulmonarye dema from NSTEMI v lymphangitis carcinomatosis or some combo of these. She needs BiPAP at the minimum but she refuses. She is even declining intubation but daughter wants full code  Plan Face mask o2 Increase lasix Check PCT and cover for HCAP with vanc and zosyn Family given time to think - reassess in 1h to next few hours - intubate if worse

## 2016-06-12 NOTE — Progress Notes (Signed)
Intubation started 2048-06-01 with Corrie Dandy, MD present in patient's room. During intubation, patient coded at 06/02/51 with return of pulse. Patient codes again at 02-Jun-2118. CPR continues until 06/01/37. Time of death called by Corrie Dandy, MD at 01-Jun-2137. Patient's pupils were non reactive, no breaths sounds were present, pulses not present, and heart rhythm asystole. Family notified by MD. Family currently at bedside. CDS notified. Cardiac strip printed and in patient's chart.

## 2016-06-12 NOTE — Progress Notes (Signed)
ANTICOAGULATION CONSULT NOTE - Follow Up Consult  Pharmacy Consult for Heparin Indication: chest pain/ACS  Allergies  Allergen Reactions  . Bactrim [Sulfamethoxazole-Trimethoprim] Nausea And Vomiting and Other (See Comments)    Reaction:  Fever and chills   . Cephalexin Palpitations    Patient Measurements: Height: 5\' 4"  (162.6 cm) Weight: 139 lb 1.8 oz (63.1 kg) IBW/kg (Calculated) : 54.7 Heparin Dosing Weight:   Vital Signs: Temp: 97.1 F (36.2 C) (04/26 0005) Temp Source: Axillary (04/26 0005) BP: 144/86 (04/26 0000) Pulse Rate: 100 (04/26 0100)  Labs:  Recent Labs  06/02/2016 06/11/2016 1148 05/25/2016 1451 05/21/2016 1824 Jun 15, 2016 0221 06/15/2016 0241  HGB 10.5* 10.5*  --   --  10.5*  --   HCT 30* 31.9*  --   --  30.8*  --   PLT 106* 91*  --   --  85*  --   HEPARINUNFRC  --   --   --   --   --  0.74*  CREATININE 0.4* 0.55  --   --   --   --   TROPONINI  --  0.65* 0.68* 0.73*  --   --     Estimated Creatinine Clearance: 63.8 mL/min (by C-G formula based on SCr of 0.55 mg/dL).   Medications:  Infusions:  . sodium chloride    . heparin 1,000 Units/hr (2016/06/15 0100)    Assessment: Patient with heparin level just above goal.  No heparin issues per RN.  Goal of Therapy:  Heparin level 0.3-0.7 units/ml Monitor platelets by anticoagulation protocol: Yes   Plan:  Decrease heparin to 900 units/hr Recheck level at [place], [place] 2016/06/15,3:14 AM

## 2016-06-12 NOTE — Progress Notes (Signed)
[place] arrived to unit prior to family and after pt passed. Pt's daughter, two sons and companion were very tearful, but appropriately so. She provided funeral home information [facility] [place] same to pt nurse.  [place] stayed with family  Until they left the unit. Chaplain Ernest Haber, M.Div.   06/28/2016 2200  Clinical Encounter Type  Visited With Family

## 2016-06-12 NOTE — Progress Notes (Signed)
Transport and Bed placement called.

## 2016-06-12 NOTE — Procedures (Addendum)
Intubation Procedure Note [name] [id number] [birth year]  Pt with worsening respiratory status throughout the day and afternoon with o2 desatn to 88-90% on NRBM. Pt's RR in the 40s and with respiratory distress.  Refused bipap. She remained a full code.     Procedure: Intubation Indications: Respiratory insufficiency  Procedure Details Consent: Risks of procedure as well as the alternatives and risks of each were explained to the (patient/caregiver).  Consent for procedure obtained. Time Out: Verified patient identification, verified procedure, site/side was marked, verified correct patient position, special equipment/implants available, medications/allergies/relevent history reviewed, required imaging and test results available.  Performed  Maximum sterile technique was used including gloves, hand hygiene and mask.  MAC and 3  With gleidoscope use, VC were visualized.  ETT size 7.5 inserted w/o difficulty. O2 sats remained > 90% during intubation.  BP dropped in the 60s with intubation.  Saline bolus started then went into sinus bradycardia/arrest.  Pls see separate note.   Pt received 2 mg versed, 50 mcg Fentanyl, 20 mg etomidate, 60 mg Rocuronium >> all IV and divided doses  Evaluation Hemodynamic Status: Transient hypotension treated with pressors; O2 sats:  Patient's Current Condition: stable after a brief arrest.  Complications: Complications of sinus bradycardia/hypotension and going into arrest. pls see separate note Patient did not tolerate procedure well. Chest X-ray ordered to verify placement.  CXR: pending. >>. CXR with ETT tip acceptable.  ETT at level 20 by the lip.    Danielle [name] Jun 16, 2016

## 2016-06-12 NOTE — Consult Note (Signed)
Name: Danielle [name] MRN: [id number] DOB: 03-Oct-1955    ADMISSION DATE:  06/04/2016 CONSULTATION DATE:  Jun 13, 2016  REFERRING MD :  Cathlean Sauer  CHIEF COMPLAINT:  SOB   HISTORY OF PRESENT ILLNESS:  Danielle [name] is a 61 y.o. F with PMH as outlined below including recently diagnosed polymyositis at [facility] and was treated with IVIG and steroids (just discharged 06/01/16 to SNF with long steroid taper) and infiltrating ductal carcinoma of the right breast s/p lumpectomy (2015), chemo/radiation. She had CT chest/abd/pelvis 05/09/16 that demonstrated soft tissue mass in right axilla and on 4/12, she had biopsy of this at [facility] that returned positive for metastatic carcinoma (biomarkers unfortunately indeterminate as not enough tissue for full analysis).  She informed team at [facility] that she preferred to follow up with [facility] where she was seen in the past.  Since discharge, she had been experiencing worsening dyspnea that was worse with exertion and also worse while lying flat.  She also had PND and LE edema.  Symptoms progressed to the point where she was SOB with very minimal exertion.  While at [facility] on 4/25, she was noted to severely hypoxic; therefore, was transported to [facility] ED for further evaluation.  In ED, she was noted to hypoxic and she was felt to be volume overloaded.  She also had elevated troponin for which she was started on heparin gtt with cardiology consult.  CT of the chest demonstrated bilateral pleural effusions R > L as well as significant bony lytic lesions in thoracic spine with T10-11 compression deformities and height loss at C7 and T1.  She was seen by cardiology who did not feel that troponin bump was due to ACS.  They agreed that she likely had acute CHF and recommended repeating echo and continuing diuresis.  After transfer to SDU, she required HFNC and still had borderline O2 saturations. PCCM was subsequently asked to see.  Despite her increased WOB and appearance as if  she would soon require intubation, pt declined biPAP admantly despite multiple conversatinos. She is also wanting intubation postponed "for now". 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  has a past surgical history that includes Tubal ligation (1980); Mastectomy, partial (06/04/2011); Axillary lymph node dissection (06/04/2011); Portacath placement (07/23/2011); Skin biopsy (12/19/2011); Port-a-cath removal (Left, 03/22/2014); and Muscle biopsy (Right, 09/08/2015). Prior to Admission medications   Medication Sig Start Date End Date Taking? Authorizing Provider  acetaminophen (TYLENOL) 325 MG tablet Take 650 mg by mouth every 6 (six) hours as needed for mild pain or moderate pain.    Yes Historical Provider, MD  albuterol (ACCUNEB) 0.63 MG/3ML nebulizer solution Take 1 ampule by nebulization every 6 (six) hours as needed for wheezing.   Yes Historical Provider, MD  calcium carbonate (OSCAL) 1500 (600 Ca) MG TABS tablet Take 600 mg of elemental calcium by mouth 2 (two) times daily with a meal.   Yes Historical Provider, MD  HYDROcodone-acetaminophen (NORCO/VICODIN) 5-325 MG tablet Take 1 tablet by mouth at bedtime as needed for moderate pain.   Yes  Historical Provider, MD  lidocaine (LIDODERM) 5 % Place 1 patch onto the skin daily. Remove & Discard patch within 12 hours or as directed by MD Apply to most painful area for up to 12 hours in a 24 hour period.   Yes  Historical Provider, MD  lisinopril (PRINIVIL,ZESTRIL) 20 MG tablet Take 20 mg by mouth daily.   Yes Historical Provider, MD  methocarbamol (ROBAXIN) 500 MG tablet Take 250 mg by mouth 3 (three) times daily as needed for muscle spasms.   Yes Historical Provider, MD  pantoprazole (PROTONIX) 40 MG tablet Take 40 mg by mouth daily before breakfast.    Yes Historical Provider, MD  predniSONE (DELTASONE) 10 MG tablet Take 10-60 mg by mouth daily with breakfast. Pt is to take as a taper:  Six tablets daily for two weeks, five tablets daily for two weeks, and four tablets daily until follow-up appointment with Dr. Rozelle Logan. 06/01/16  Yes Historical Provider, MD   Allergies  Allergen Reactions  . Bactrim [Sulfamethoxazole-Trimethoprim] Nausea And Vomiting and Other (See Comments)    Reaction:  Fever and chills   . Cephalexin Palpitations    FAMILY HISTORY:  family history includes Cancer in her sister; Coronary artery disease in her father; Diabetes in her father and mother; Heart disease in her father and mother; Osteoarthritis in her sister; Stroke in her mother. SOCIAL HISTORY:  reports that she has quit smoking. Her smoking use included Cigarettes. She has a 60.00 pack-year smoking history. She has never used smokeless tobacco. She reports that she does not drink alcohol or use drugs. .  SUBJECTIVE:   VITAL SIGNS: Temp:  [97.1 F (36.2 C)-98 F (36.7 C)] 97.1 F (36.2 C) (04/26 1600) Pulse Rate:  [95-119] 119 (04/26 1700) Resp:  [13-32] 28 (04/26 1700) BP: (109-156)/(63-95) 127/77 (04/26 1600) SpO2:  [88 %-100 %] 97 % (04/26 1700) Weight:  [64.2 kg (141 lb 8.6 oz)] 64.2 kg (141 lb 8.6 oz) (04/26 0526)   EXAM  General Appearance:    Looks criticall ill, deconditined, weak,   Head:    Normocephalic, without obvious abnormality, atraumatic  Eyes:    PERRL - yes, conjunctiva/corneas - clear      Ears:    Normal external ear canals, both ears  Nose:   NG tube - no  Throat:  ETT TUBE - no  , OG tube - no but has FACE MASK 02  Neck:   Supple,  No enlargement/tenderness/nodules     Lungs:     Tachypneic, Mild paradoxical, face mask on, crackles +  Chest wall:    No deformity  Heart:    S1 and S2 normal, no murmur, CVP - na.  Pressors - na  Abdomen:     Soft, no masses, no organomegaly  Genitalia:    Not done  Rectal:   not done  Extremities:   Extremities- mild edema     Skin:   Intact in exposed areas . Sacral area - unknown     Neurologic:   Sedation - none -> RASS - -1 equivalent . Moves all 4s - yes. CAM-ICU - neg for deliruim but she is exhausted . Orientation - yes       LABS  PULMONARY  Recent Labs Lab 07-05-16 1610  PHART 7.466*  PCO2ART 26.3*  PO2ART 59.6*  HCO3 18.7*  O2SAT 90.1    CBC  Recent Labs Lab 05/27/2016 05/20/2016 1148 07/05/16 0221  HGB 10.5* 10.5* 10.5*  HCT 30* 31.9* 30.8*  WBC 11.3 8.6 9.3  PLT 106* 91* 85*    COAGULATION No results for input(s): INR in the last 168 hours.  CARDIAC   Recent Labs Lab 05/14/2016 1148 05/30/2016 1451 05/13/2016 1824  TROPONINI 0.65* 0.68* 0.73*   No results for input(s): PROBNP in the last 168 hours.   CHEMISTRY  Recent Labs Lab 05/21/2016 05/14/2016 1148 06/16/16 0221  NA 139 138 138  K 3.9 3.4* 4.3  CL  --  103 106  CO2  --  22 20*  GLUCOSE  --  83 78  BUN 25* 25* 23*  CREATININE 0.4* 0.55 0.45  CALCIUM  --  8.8* 9.0   Estimated Creatinine Clearance: 63.8 mL/min (by C-G formula based on SCr of 0.45 mg/dL).   LIVER  Recent Labs Lab 05/15/2016 1148  AST 101*  ALT 92*  ALKPHOS 147*  BILITOT 1.2  PROT 6.8  ALBUMIN 3.1*     INFECTIOUS  Recent Labs Lab 05/22/2016 1159 06/05/2016 1449  LATICACIDVEN 1.65 1.35     ENDOCRINE CBG (last 3)  No results for input(s): GLUCAP in the last 72 hours.       IMAGING x48h  - image(s) personally visualized  -   highlighted in bold Ct Angio Chest Pe W And/or Wo Contrast  Result Date: 05/19/2016 CLINICAL DATA:  Shortness of  breath and hypoxia EXAM: CT ANGIOGRAPHY CHEST WITH CONTRAST TECHNIQUE: Multidetector CT imaging of the chest was performed using the standard protocol during bolus administration of intravenous contrast. Multiplanar CT image reconstructions and MIPs were obtained to evaluate the vascular anatomy. CONTRAST:  82 mL Isovue 370. COMPARISON:  06/05/2016, 09/05/2015 FINDINGS: Cardiovascular: Diffuse atherosclerotic changes of the thoracic aorta and its branches are noted. No evidence of dissection is seen. Mild dilatation of the ascending aorta 4 cm is noted. The pulmonary artery demonstrates a normal branching pattern. No intraluminal filling defect is identified to suggest pulmonary embolism. Mild coronary calcifications are noted. Mild cardiac enlargement is seen. No pericardial effusion is noted. Mediastinum/Nodes: The thoracic inlet is within normal limits. No significant hilar or mediastinal adenopathy is noted. Scattered small lymph nodes are noted in the prevascular region. Mildly calcified lymph node nodes are seen in the subcarinal region as well as within the hila bilaterally. These would be consistent with prior granulomatous disease. The esophagus as visualize is within normal limits. Lungs/Pleura: Bilateral pleural effusions are noted right greater than left. Diffuse emphysematous changes are seen throughout both lungs. Patchy infiltrative changes are noted in the bases bilaterally slightly greater on the right than the left. Some of these have a somewhat nodular appearance particularly in the right lower lobe on image number 36 of series 11. An 8 mm noncalcified nodule is noted in the left upper lobe along the fissure best seen on image number 17 of series 11. Scattered calcified granulomas are noted as well. Upper Abdomen: The visualized upper abdomen shows scattered calcified splenic granulomas. Musculoskeletal: Postsurgical changes are noted in the right breast consistent with the patient clinical  history. Mottled appearance to the bony structures is noted diffusely but most notable within the thoracic spine. A healing fracture of the right fourth rib is noted posterolaterally. In expansile lesion is noted involving the central portion of the first rib adjacent to the thoracic spine. Some scattered lytic lesions are noted within the scapula on the left. Compression deformities of C7, T1, T10 and T11 are noted of uncertain age. Review of the MIP images confirms the above findings. IMPRESSION: Bilateral pleural  effusions right greater than left as well as patchy infiltrative changes in the bases with some nodular changes seen. Although these may simply represent pneumonic infiltrates the possibility of neoplastic involvement deserves consideration. The bony structures demonstrates significant lytic lesions most notable in the thoracic spine with a T11 and T10 compression deformity as well as mild height loss at C7 and T1. These changes are consistent with metastatic disease. Changes consistent with prior granulomatous disease. Electronically Signed   By: Inez Catalina M.D.   On: 05/17/2016 16:02   Dg Chest Port 1 View  Result Date: Jul 03, 2016 CLINICAL DATA:  Shortness of breath. EXAM: PORTABLE CHEST 1 VIEW COMPARISON:  CT 05/23/2016.  Chest x-ray 05/23/2016, 05/18/2016. FINDINGS: Mediastinum and heart size stable. Progressive bilateral from interstitial infiltrates noted. Pneumonitis and/or interstitial edema could present in this fashion. These changes may be neoplastic. Persistent bilateral pleural effusions. Reference is made to prior CT report for discussion of bony lesions suggesting metastatic disease. No pneumothorax. IMPRESSION: 1. Progressive bilateral pulmonary interstitial infiltrates. Pneumonitis and/or interstitial edema could present in this fashion. These changes could also be neoplastic. Persistent bilateral pleural effusions. 2. Reference is made to CT report for discussion of bony lytic  lesions suggesting metastasis. Electronically Signed   By: Marcello Moores  Register   On: 2016-07-03 08:39   Dg Chest Port 1 View  Result Date: 05/23/2016 CLINICAL DATA:  61 year old former smoker presenting with acute onset of cough. Hypoxia is present. Personal history of right breast cancer. EXAM: PORTABLE CHEST 1 VIEW COMPARISON:  05/18/2016, 05/16/2016 and earlier. FINDINGS: Cardiac silhouette moderately enlarged. Mild-to-moderate diffuse interstitial pulmonary edema. Chronic persistent bilateral pleural effusions, moderate to large in size, unchanged. Associated dense consolidation in the lower lobes. Surgical clips in the right axilla from prior node dissection. IMPRESSION: 1. Acute mild-to-moderate CHF, with stable cardiomegaly and interstitial pulmonary edema. 2. Chronic persistent moderate to large bilateral pleural effusions, right greater than left, with associated chronic passive atelectasis in the lower lobes (favored over pneumonia). Electronically Signed   By: Evangeline Dakin M.D.   On: 05/24/2016 11:13       ASSESSMENT and PLAN  Acute respiratory failure with hypoxia (HCC) ddx is ILD flare, Pulmonarye dema from NSTEMI v lymphangitis carcinomatosis or some combo of these. She needs BiPAP at the minimum but she refuses. She is even declining intubation but daughter wants full code  Plan Face mask o2 Increase lasix Check PCT and cover for HCAP with vanc and zosyn Family given time to think - reassess in 1h to next few hours - intubate if worse        FAMILY  - Updates: 07-03-16  - done - Inter-disciplinary family meet or Palliative Care meeting due by:  DAy 7. Current LOS is LOS 1 days  CODE STATUS    Code Status Orders        Start     Ordered   05/22/2016 1808  Full code  Continuous     05/24/2016 1807    Code Status History    Date Active Date Inactive Code Status Order ID Comments User Context   05/15/2016 12:09 AM 05/19/2016  4:27 PM Full Code [id number]  Orvan Falconer, MD  Inpatient   08/17/2011  5:28 PM 08/20/2011  4:43 PM Full Code [id number]  Kathie Dike, MD Inpatient        DISPO Keep in ICU       The patient is critically ill with multiple organ systems failure and requires high complexity decision making  for assessment and support, frequent evaluation and titration of therapies, application of advanced monitoring technologies and extensive interpretation of multiple databases.   Critical Care Time devoted to patient care services described in this note is  30  Minutes. This time reflects time of care of this signee Dr Brand Males. This critical care time does not reflect procedure time, or teaching time or supervisory time of PA/NP/Med student/Med Resident etc but could involve care discussion time    Dr. Brand Males, M.D., [facility].C.P Pulmonary and Critical Care Medicine Staff Physician [facility] Pager: [phone], If no answer or between  15:00h - 7:00h: call [phone]  06-21-16 6:54 PM

## 2016-06-12 NOTE — Progress Notes (Signed)
   [facility] PCCM  >  Pt admitted with acute hypoxemic resp failure 2/2 HCAP + ACS/Demend ischemia + Pulmonary edema + possible ILD flare up in a background of Metastatic Breast CA, likely with mets to the lungs.  She was hypoxemic and stayed in O2 sats 85-90% on NRBM all day. Pt was seen, chronically ill, very pale, moribund, 120/70, HR 120, RR 40-50 on NRBM.  (+) accessory muscle use. (+) crackles at bases.   >  Pt  refused Bipap.  She did not want to be intubated but she deferred decision making to her daughter.  I called up daughter and discussed with her over all poor prognosis and increased risk of Cx with intubation.  She knew her mother did not want to be intubated but she said she wanted to proceed with intubation and see if it will help her. Pt deferred decision making to her daughter and she was already hypoxic and confused when I saw her.   >  Pt was intubated w/o difficulty.  Her o2 sats remained > 90% with intubation.  Her BP dropped in the 60s minutes after intubation and we started saline bolus.  She quickly went into junctional rhythm with HR in 40s  then went into arrest.  We quickly gave 1 mg of epi + 1 mg of atropine + 1 amp bicarbonate. Levophed was ordered when her BP dropped initially.  She went into arrest for 3 minutes.   > She went into another arrest after  5-10 minutes of being revived.   She went into junctional rhythm then asystole.  CPR was initiated.  She received 6 mg epi, 2 amps NaHCO3, 1 amp Ca Cl, 1 amp D50  during this code.  She was mostly PEA but had 3 episodes of Vfib for which we did defibrillation.  We also gave amiodarone push at 300 mg IV for Vfib/arrhythmia. She had pink  frothy secretions per ET during CPR.  Daughter was updated during the code.  We pronounced pt at 21:39.  Total arrest time was 22 minutes.  Daughter was updated of pt's demise.   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   Critical Care time spent on this pt today : 70 minutes.   Monica Becton, MD 06-22-16, 9:59 PM [facility] Pager [phone] After 3 pm or if no answer, call [phone]

## 2016-06-12 NOTE — Discharge Summary (Signed)
Name: Danielle [name] MRN: [id number] DOB: 13-Mar-1955    ADMISSION DATE:  06/06/2016 CONSULTATION DATE:  06/14/16  REFERRING MD :  Cathlean Sauer  CHIEF COMPLAINT:  SOB   HISTORY OF PRESENT ILLNESS:  Danielle [name] is a 61 y.o. 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 She had CT chest/abd/pelvis 05/09/16 that demonstrated soft tissue mass in right axilla and on 4/12, she had biopsy of this at [facility] that returned positive for metastatic carcinoma (biomarkers unfortunately indeterminate as not enough tissue for full analysis).  She informed team at [facility] that she preferred to follow up with [facility] where she was seen in the past.  Since discharge, she had been experiencing worsening dyspnea that was worse with exertion and also worse while lying flat.  She also had PND and LE edema.  Symptoms progressed to the point where she was SOB with very minimal exertion.  While at [facility] on 4/25, she was noted to severely hypoxic; therefore, was transported to [facility] ED for further evaluation.  In ED, she was noted to hypoxic and she was felt to be volume overloaded.  She also had elevated troponin for which she was started on heparin gtt with cardiology consult.  CT of the chest demonstrated bilateral pleural effusions R > L as well as significant bony lytic lesions in thoracic spine with T10-11 compression deformities and height loss at C7 and T1.  She was seen by cardiology who did not feel that troponin bump was due to ACS.  They agreed that she likely had acute CHF and recommended repeating echo and continuing diuresis.  After transfer to SDU, she required HFNC and still had borderline O2 saturations. PCCM was subsequently asked to see.  Despite her increased WOB and appearance as if  she would soon require intubation, pt declined biPAP admantly despite multiple conversatinos. She is also wanting intubation postponed "for now". 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  has a past surgical history that includes Tubal ligation (1980); Mastectomy, partial (06/04/2011); Axillary lymph node dissection (06/04/2011); Portacath placement (07/23/2011); Skin biopsy (12/19/2011); Port-a-cath removal (Left, 03/22/2014); and Muscle biopsy (Right, 09/08/2015). Prior to Admission medications   Medication Sig Start Date End Date Taking? Authorizing Provider  acetaminophen (TYLENOL) 325 MG tablet Take 650 mg by mouth every 6 (six) hours as needed for mild pain or moderate pain.    Yes Historical Provider, MD  albuterol (ACCUNEB) 0.63 MG/3ML nebulizer solution Take 1 ampule by nebulization every 6 (six) hours as needed for wheezing.   Yes Historical Provider, MD  calcium carbonate (OSCAL) 1500 (600 Ca) MG TABS tablet Take 600 mg of elemental calcium by mouth 2 (two) times daily with a meal.   Yes Historical Provider, MD  HYDROcodone-acetaminophen (NORCO/VICODIN) 5-325 MG tablet Take 1 tablet by mouth at bedtime as needed for moderate pain.   Yes  Historical Provider, MD  lidocaine (LIDODERM) 5 % Place 1 patch onto the skin daily. Remove & Discard patch within 12 hours or as directed by MD Apply to most painful area for up to 12 hours in a 24 hour period.   Yes  Historical Provider, MD  lisinopril (PRINIVIL,ZESTRIL) 20 MG tablet Take 20 mg by mouth daily.   Yes Historical Provider, MD  methocarbamol (ROBAXIN) 500 MG tablet Take 250 mg by mouth 3 (three) times daily as needed for muscle spasms.   Yes Historical Provider, MD  pantoprazole (PROTONIX) 40 MG tablet Take 40 mg by mouth daily before breakfast.    Yes Historical Provider, MD  predniSONE (DELTASONE) 10 MG tablet Take 10-60 mg by mouth daily with breakfast. Pt is to take as a taper:  Six tablets daily for two weeks, five tablets daily for two weeks, and four tablets daily until follow-up appointment with Dr. Rozelle Logan. 06/01/16  Yes Historical Provider, MD   Allergies  Allergen Reactions  . Bactrim [Sulfamethoxazole-Trimethoprim] Nausea And Vomiting and Other (See Comments)    Reaction:  Fever and chills   . Cephalexin Palpitations    FAMILY HISTORY:  family history includes Cancer in her sister; Coronary artery disease in her father; Diabetes in her father and mother; Heart disease in her father and mother; Osteoarthritis in her sister; Stroke in her mother. SOCIAL HISTORY:  reports that she has quit smoking. Her smoking use included Cigarettes. She has a 60.00 pack-year smoking history. She has never used smokeless tobacco. She reports that she does not drink alcohol or use drugs. .  SUBJECTIVE:   VITAL SIGNS: Temp:  [97.1 F (36.2 C)-98 F (36.7 C)] 97.1 F (36.2 C) (04/26 1600) Pulse Rate:  [55-119] 55 (04/26 2116) Resp:  [17-32] 20 (04/26 2116) BP: (109-156)/(63-95) 127/77 (04/26 1600) SpO2:  [91 %-99 %] 99 % (04/26 2116) FiO2 (%):  [100 %] 100 % (04/26 2116) Weight:  [64.2 kg (141 lb 8.6 oz)] 64.2 kg (141 lb 8.6 oz) (04/26 0526)   EXAM  General Appearance:    Looks criticall ill, deconditined, weak,   Head:    Normocephalic, without obvious abnormality, atraumatic  Eyes:    PERRL - yes, conjunctiva/corneas - clear      Ears:    Normal external ear canals, both ears  Nose:    NG tube - no  Throat:  ETT TUBE - no , OG tube - no but has FACE MASK 02  Neck:   Supple,  No enlargement/tenderness/nodules     Lungs:     Tachypneic, Mild paradoxical, face mask on, crackles +  Chest wall:    No deformity  Heart:    S1 and S2 normal, no murmur, CVP - na.  Pressors - na  Abdomen:     Soft, no masses, no organomegaly  Genitalia:    Not done  Rectal:   not done  Extremities:   Extremities- mild edema     Skin:   Intact in exposed areas . Sacral area - unknown     Neurologic:   Sedation - none -> RASS - -1 equivalent . Moves all 4s - yes. CAM-ICU - neg for deliruim but she is exhausted . Orientation - yes       LABS  PULMONARY  Recent Labs Lab Jun 22, 2016 1610  PHART 7.466*  PCO2ART 26.3*  PO2ART 59.6*  HCO3 18.7*  O2SAT 90.1    CBC  Recent Labs Lab 05/15/2016 05/19/2016 1148 06-22-2016 0221  HGB  10.5* 10.5* 10.5*  HCT 30* 31.9* 30.8*  WBC 11.3 8.6 9.3  PLT 106* 91* 85*    COAGULATION No results for input(s): INR in the last 168 hours.  CARDIAC    Recent Labs Lab 05/13/2016 1148 05/29/2016 1451 05/15/2016 1824  TROPONINI 0.65* 0.68* 0.73*   No results for input(s): PROBNP in the last 168 hours.   CHEMISTRY  Recent Labs Lab 06/08/2016 06/02/2016 1148 06/17/2016 0221  NA 139 138 138  K 3.9 3.4* 4.3  CL  --  103 106  CO2  --  22 20*  GLUCOSE  --  83 78  BUN 25* 25* 23*  CREATININE 0.4* 0.55 0.45  CALCIUM  --  8.8* 9.0   Estimated Creatinine Clearance: 63.8 mL/min (by C-G formula based on SCr of 0.45 mg/dL).   LIVER  Recent Labs Lab 06/03/2016 1148  AST 101*  ALT 92*  ALKPHOS 147*  BILITOT 1.2  PROT 6.8  ALBUMIN 3.1*     INFECTIOUS  Recent Labs Lab 05/26/2016 1159 05/13/2016 1449  LATICACIDVEN 1.65 1.35     ENDOCRINE CBG (last 3)  No results for input(s): GLUCAP in the last 72 hours.       IMAGING x48h  - image(s) personally visualized  -   highlighted in bold Ct Angio Chest Pe W And/or Wo Contrast  Result Date:  06/04/2016 CLINICAL DATA:  Shortness of breath and hypoxia EXAM: CT ANGIOGRAPHY CHEST WITH CONTRAST TECHNIQUE: Multidetector CT imaging of the chest was performed using the standard protocol during bolus administration of intravenous contrast. Multiplanar CT image reconstructions and MIPs were obtained to evaluate the vascular anatomy. CONTRAST:  82 mL Isovue 370. COMPARISON:  06/06/2016, 09/05/2015 FINDINGS: Cardiovascular: Diffuse atherosclerotic changes of the thoracic aorta and its branches are noted. No evidence of dissection is seen. Mild dilatation of the ascending aorta 4 cm is noted. The pulmonary artery demonstrates a normal branching pattern. No intraluminal filling defect is identified to suggest pulmonary embolism. Mild coronary calcifications are noted. Mild cardiac enlargement is seen. No pericardial effusion is noted. Mediastinum/Nodes: The thoracic inlet is within normal limits. No significant hilar or mediastinal adenopathy is noted. Scattered small lymph nodes are noted in the prevascular region. Mildly calcified lymph node nodes are seen in the subcarinal region as well as within the hila bilaterally. These would be consistent with prior granulomatous disease. The esophagus as visualize is within normal limits. Lungs/Pleura: Bilateral pleural effusions are noted right greater than left. Diffuse emphysematous changes are seen throughout both lungs. Patchy infiltrative changes are noted in the bases bilaterally slightly greater on the right than the left. Some of these have a somewhat nodular appearance particularly in the right lower lobe on image number 36 of series 11. An 8 mm noncalcified nodule is noted in the left upper lobe along the fissure best seen on image number 17 of series 11. Scattered calcified granulomas are noted as well. Upper Abdomen: The visualized upper abdomen shows scattered calcified splenic granulomas. Musculoskeletal: Postsurgical changes are noted in the right breast  consistent with the patient clinical history. Mottled appearance to the bony structures is noted diffusely but most notable within the thoracic spine. A healing fracture of the right fourth rib is noted posterolaterally. In expansile lesion is noted involving the central portion of the first rib adjacent to the thoracic spine. Some scattered lytic lesions are noted within the scapula on the left. Compression deformities of C7, T1, T10 and T11 are noted of uncertain age. Review of  the MIP images confirms the above findings. IMPRESSION: Bilateral pleural effusions right greater than left as well as patchy infiltrative changes in the bases with some nodular changes seen. Although these may simply represent pneumonic infiltrates the possibility of neoplastic involvement deserves consideration. The bony structures demonstrates significant lytic lesions most notable in the thoracic spine with a T11 and T10 compression deformity as well as mild height loss at C7 and T1. These changes are consistent with metastatic disease. Changes consistent with prior granulomatous disease. Electronically Signed   By: Inez Catalina M.D.   On: 05/25/2016 16:02   Dg Chest Port 1 View  Result Date: Jun 28, 2016 CLINICAL DATA:  Shortness of breath. EXAM: PORTABLE CHEST 1 VIEW COMPARISON:  CT 05/29/2016.  Chest x-ray 05/20/2016, 05/18/2016. FINDINGS: Mediastinum and heart size stable. Progressive bilateral from interstitial infiltrates noted. Pneumonitis and/or interstitial edema could present in this fashion. These changes may be neoplastic. Persistent bilateral pleural effusions. Reference is made to prior CT report for discussion of bony lesions suggesting metastatic disease. No pneumothorax. IMPRESSION: 1. Progressive bilateral pulmonary interstitial infiltrates. Pneumonitis and/or interstitial edema could present in this fashion. These changes could also be neoplastic. Persistent bilateral pleural effusions. 2. Reference is made to CT  report for discussion of bony lytic lesions suggesting metastasis. Electronically Signed   By: Marcello Moores  Register   On: 06/28/16 08:39   Dg Chest Port 1 View  Result Date: 05/17/2016 CLINICAL DATA:  61 year old former smoker presenting with acute onset of cough. Hypoxia is present. Personal history of right breast cancer. EXAM: PORTABLE CHEST 1 VIEW COMPARISON:  05/18/2016, 05/16/2016 and earlier. FINDINGS: Cardiac silhouette moderately enlarged. Mild-to-moderate diffuse interstitial pulmonary edema. Chronic persistent bilateral pleural effusions, moderate to large in size, unchanged. Associated dense consolidation in the lower lobes. Surgical clips in the right axilla from prior node dissection. IMPRESSION: 1. Acute mild-to-moderate CHF, with stable cardiomegaly and interstitial pulmonary edema. 2. Chronic persistent moderate to large bilateral pleural effusions, right greater than left, with associated chronic passive atelectasis in the lower lobes (favored over pneumonia). Electronically Signed   By: Evangeline Dakin M.D.   On: 06/05/2016 11:13       ASSESSMENT and PLAN  Acute hypoxemic respiratory failure (HCC) ddx is ILD flare, Pulmonarye dema from NSTEMI v lymphangitis carcinomatosis or some combo of these. She needs BiPAP at the minimum but she refuses. She is even declining intubation but daughter wants full code  Plan Face mask o2 Increase lasix Check PCT and cover for HCAP with vanc and zosyn Family given time to think - reassess in 1h to next few hours - intubate if worse    Goals of care, counseling/discussion  Interdisciplinary Goals of Care Family Meeting   Date carried out:: 06/28/16  Location of the meeting: Conference room  [place] involved: Physician, Bedside Registered Nurse, Social Worker and Family Member or next of kin  [place] or acting medical decision maker: daughter Omah Dewalt    Discussion: We discussed goals of care for [employer]  .  Daughter aware of poor prognosis. [place] to make mom comfortable so asking for full code  Code status: Full Code  Disposition: Continue current acute care  Time spent for the meeting: 15 min  RAMASWAMY,MURALI 06-28-2016, 7:00 PM        FAMILY  - Updates: 06-28-16  - done - Inter-disciplinary family meet or Palliative Care meeting due by:  DAy 7. Current LOS is LOS 1 days  CODE STATUS  Code Status Orders        Start     Ordered   05/23/2016 1808  Full code  Continuous     05/31/2016 1807    Code Status History    Date Active Date Inactive Code Status Order ID Comments User Context   05/15/2016 12:09 AM 05/19/2016  4:27 PM Full Code [id number]  Orvan Falconer, MD Inpatient   08/17/2011  5:28 PM 08/20/2011  4:43 PM Full Code [id number]  Kathie Dike, MD Inpatient        DISPO Keep in ICU       The patient is critically ill with multiple organ systems failure and requires high complexity decision making for assessment and support, frequent evaluation and titration of therapies, application of advanced monitoring technologies and extensive interpretation of multiple databases.   Critical Care Time devoted to patient care services described in this note is  30  Minutes. This time reflects time of care of this signee Dr Brand Males. This critical care time does not reflect procedure time, or teaching time or supervisory time of PA/NP/Med student/Med Resident etc but could involve care discussion time    Dr. Brand Males, M.D., [facility].C.P Pulmonary and Critical Care Medicine Staff Physician [place] [facility] Pager: [phone], If no answer or between  15:00h - 7:00h: call [phone]  07-01-2016 10:01 PM     Addendum :   [facility] PCCM  >  Pt admitted with acute hypoxemic resp failure 2/2 HCAP + ACS/Demend ischemia + Pulmonary edema + possible ILD flare up in a background of Metastatic Breast CA, likely with mets to the  lungs.  She was hypoxemic and stayed in O2 sats 85-90% on NRBM all day. Pt was seen, chronically ill, very pale, moribund, 120/70, HR 120, RR 40-50 on NRBM.  (+) accessory muscle use. (+) crackles at bases.   >  Pt  refused Bipap.  She did not want to be intubated but she deferred decision making to her daughter.  I called up daughter and discussed with her over all poor prognosis and increased risk of Cx with intubation.  She knew her mother did not want to be intubated but she said she wanted to proceed with intubation and see if it will help her. Pt deferred decision making to her daughter and she was already hypoxic and confused when I saw her.   >  Pt was intubated w/o difficulty.  Her o2 sats remained > 90% with intubation.  Her BP dropped in the 60s minutes after intubation and we started saline bolus.  She quickly went into junctional rhythm with HR in 40s  then went into arrest.  We quickly gave 1 mg of epi + 1 mg of atropine + 1 amp bicarbonate. Levophed was ordered when her BP dropped initially.  She went into arrest for 3 minutes.   > She went into another arrest after  5-10 minutes of being revived.   She went into junctional rhythm then asystole.  CPR was initiated.  She received 6 mg epi, 2 amps NaHCO3, 1 amp Ca Cl, 1 amp D50  during this code.  She was mostly PEA but had 3 episodes of Vfib for which we did defibrillation.  We also gave amiodarone push at 300 mg IV for Vfib/arrhythmia. She had pink  frothy secretions per ET during CPR.  Daughter was updated during the code.  We pronounced pt at 21:39.  Total  arrest time was 22 minutes.  Daughter was updated of pt's demise.   > Final diagnosis : Acute hypoxemic resp failure 2/2 likely fatal Acute Coronary Syndrome + HCAP + Pulmonary edema + metastatic Breast CA  > pls provide post mortem care.   Critical Care time spent on this pt today : 70 minutes.   Monica Becton, MD 07-01-2016, 10:01 PM [place] [facility] Pager [phone] After 3 pm or if no answer, call [phone]

## 2016-06-12 NOTE — Progress Notes (Signed)
Initial Nutrition Assessment  DOCUMENTATION CODES:   Severe malnutrition in context of acute illness/injury  INTERVENTION:  - Will order Boost Breeze TID, each supplement provides 250 kcal and 9 grams of protein - Will order daily multivitamin with minerals.  - Diet advancement as medically feasible. - RD will continue to monitor for additional nutrition-related needs.  NUTRITION DIAGNOSIS:   Malnutrition (severe) related to acute illness (polymyositis associated with autoimmune disease) as evidenced by moderate depletion of body fat, moderate depletions of muscle mass, percent weight loss  GOAL:   Patient will meet greater than or equal to 90% of their needs  MONITOR:   PO intake, Supplement acceptance, Diet advancement, Weight trends, Labs  REASON FOR ASSESSMENT:   Malnutrition Screening Tool  ASSESSMENT:   61 y.o. female with medical history significant of polymyositis associated with autoimmune disease, condition that was recently diagnosed early this month at [facility], she was discharged on April 20, with a long taper steroids to skilled nursing facility. She was treated with IV immunoglobulin and steroids.  Pt seen for MST. BMI indicates normal weight. Pt has been on CLD with no documented intakes. No family/visitors present at this time. Pt with eyes closed upon entering the room and states that she is feeling generally unwell and not up to a conversation at this time. Pt was seen by another RD on 05/15/16. Per that note: - Pt consumes alcohol daily. - Intermittent N/V x2 week. - Usually feels hungry but then unable to eat.  Physical assessment not performed at this time with respect to pt's comfort. Assessment done by other RD on 4/3 showed mild/moderate fat and moderate muscle wasting. Per chart review, pt has lost 14 lbs (9% body weight) since 05/14/16 which is significant for time frame.  Medications reviewed; 1 tablet Oscal BID, 40 mg IV Lasix TID, 30 mg  IV Solu-medrol BID, 40 mg oral Protonix/day, 40 mEq oral KCl x1 dose yesterday.  Labs reviewed.    Diet Order:  Diet clear liquid Room service appropriate? Yes; Fluid consistency: Thin  Skin:  Reviewed, no issues  Last BM:  4/22 (PTA)  Height:   Ht Readings from Last 1 Encounters:  06/05/2016 5\' 4"  (1.626 m)    Weight:   Wt Readings from Last 1 Encounters:  Jul 05, 2016 141 lb 8.6 oz (64.2 kg)    Ideal Body Weight:  54.54 kg  BMI:  Body mass index is 24.29 kg/m.  Estimated Nutritional Needs:   Kcal:  1800-2055 (28-32 kcal/kg)  Protein:  77-90 grams (1.2-1.4 grams/kg)  Fluid:  >/= 1.8 L/day  EDUCATION NEEDS:   No education needs identified at this time    Jarome Matin, MS, RD, LDN, CNSC Inpatient Clinical Dietitian Pager # [phone] After hours/weekend pager # [phone]

## 2016-06-12 NOTE — Progress Notes (Addendum)
PROGRESS NOTE    ITALI MCKENDRY  YIR:[id number] DOB: 31-Oct-1955 DOA: 05/24/2016 PCP: Glo Herring, MD     Brief Narrative:  This is a 61 year old female who presented with worsening dyspnea and hypoxemia, recently discharged from [facility] after being diagnosed with polymyositis. For last 5 days her symptoms have been worsening to the point where she is dyspneic with minimal efforts, it has been associated with PND, orthopnea and lower extremity edema. Her blood pressure 130/72, heart rate 93, respiratory rate 22, oxygen saturation 91% of 100% FiO2. Her mucosa is dry, positive JVD, diffuse scattered rales and  rhonchi, positive lower extremity edema. Sodium 138, potassium 3.4, chloride 103, bicarbonate 22, glucose 83, BUN 25, creatinine 0.55, AST 101, ALT 92, BNP 1618, troponin 0.65, white count 8.6, hemoglobin 10.5, hematocrit 31.9, platelets 91. Chest x-ray personally reviewed, bilateral increase interstitial infiltrates, bilateral pleural effusions. CT chest, lung windows with bilateral pleural effusions, more right and left.   The patient will be admitted to the hospital with the working diagnosis of acute hypoxic respiratory failure due to cardiogenic pulmonary edema due to suspected diastolic heart failure decompensation complicated myocardial infarction type II.   Assessment & Plan:   Active Problems:   Heart failure ([place])  1. Acute decompensated diastolic heart failure. Clinically patient still hypervolemic, will continue diuresis with IV furosemide, urine output not recorded. Will follow on chest film. Follow on echocardiogram. Possible cardiac involvement of polymyositis. Will continue to follow on troponin. Doubt acute coronary syndrome, will consider discontinue heparin drip if no further troponin elevation. EKG personally reviewed, noted low complex qrs with incomplete RBB morphology, lateral t wave inversions, unchanged over last 24 hours.   2. Cardiogenic pulmonary edema/ ILD with  acute hypoxic respiratory failure. Patient did not tolerate well full face mask bipap, transitioned to high flow nasal cannula with better toleration. Will target 02 saturation greater then 92%. Will continue diuresis and follow on chest film. Significant deconditioning and decreased respiratory reserve due to polymyositis.   3. Polymyositis. Will continue systemic steroids, 60 mg daily. Close respiratory monitoring, bipap as tolerated. Will check a NIF, patient at risk for diaphragmatic and respiratory muscle weakness as well as rapid progressive ILD. If now improvement patient may need to be transferred to tertiary care at [facility]   4. Hypokalemia. Serum K has been corrected, will continue to follow on electrolytes, continue diuresis for now with furosemide.  5. HTN. Systolic blood pressure at 100's will continue to follow antihypertensive agents to prevent hypotension.   6. Thrombocytopenia. Worsening thrombocytopenia, from 91 to 85. No signs of bleeding, will continue close monitoring.   7. Metastatic breast carcinoma. Poor prognosis, will discuss with patient's family advance directives.     DVT prophylaxis: Heparin  Code Status: Full  Family Communication: No family at the bedside  Disposition Plan: Snf  Consultants:   Cardiology   Procedures:    Antimicrobials:   Subjective: Patient did not tolerate bipap well, placed on high flow nasal cannula. Positive dyspnea, no chest pain, no nausea or vomiting. Dyspnea is moderate in intensity worse with exertion. Unable to get labs, due to poor access.   Objective: Vitals:   07/04/2016 0400 Jul 04, 2016 0500 07-04-2016 0526 2016/07/04 0600  BP: (!) 153/92   (!) 151/88  Pulse: (!) 103 (!) 110  (!) 113  Resp: 20 19  (!) 22  Temp:   97.1 F (36.2 C)   TempSrc:   Axillary   SpO2: 95% 95%    Weight:  64.2 kg (141 lb 8.6 oz)   Height:        Intake/Output Summary (Last 24 hours) at 06/13/2016 0744 Last data filed at 06-13-2016 0600  Gross per  24 hour  Intake           108.39 ml  Output                0 ml  Net           108.39 ml   Filed Weights   06/11/2016 0932 06/11/2016 1751 06/13/16 0526  Weight: 65.8 kg (145 lb) 63.1 kg (139 lb 1.8 oz) 64.2 kg (141 lb 8.6 oz)    Examination:  General exam: deconditioned and dyspneic E ENT: positive pallor, oral mucosa dry. No icterus.  Respiratory system: scattered rales more predominant at bases, no wheezing or rhonchi. Increased inspiratory effort.  Cardiovascular system: S1 & S2 heard, RRR. moderate JVD, murmurs, rubs, gallops or clicks.Trace pedal edema. Gastrointestinal system: Abdomen is nondistended, soft and nontender. No organomegaly or masses felt. Normal bowel sounds heard. Central nervous system: Alert and oriented, somnolent. No focal neurological deficits. Extremities: Symmetric 5 x 5 power. Skin: No rashes, lesions or ulcers  Data Reviewed: I have personally reviewed following labs and imaging studies  CBC:  Recent Labs Lab 05/26/2016 05/26/2016 1148 06/13/2016 0221  WBC 11.3 8.6 9.3  NEUTROABS  --  6.0  --   HGB 10.5* 10.5* 10.5*  HCT 30* 31.9* 30.8*  MCV  --  88.4 88.8  PLT 106* 91* 85*   Basic Metabolic Panel:  Recent Labs Lab 06/11/2016 05/16/2016 1148 06/13/2016 0221  NA 139 138 138  K 3.9 3.4* 4.3  CL  --  103 106  CO2  --  22 20*  GLUCOSE  --  83 78  BUN 25* 25* 23*  CREATININE 0.4* 0.55 0.45  CALCIUM  --  8.8* 9.0   GFR: Estimated Creatinine Clearance: 63.8 mL/min (by C-G formula based on SCr of 0.45 mg/dL). Liver Function Tests:  Recent Labs Lab 05/16/2016 1148  AST 101*  ALT 92*  ALKPHOS 147*  BILITOT 1.2  PROT 6.8  ALBUMIN 3.1*   No results for input(s): LIPASE, AMYLASE in the last 168 hours. No results for input(s): AMMONIA in the last 168 hours. Coagulation Profile: No results for input(s): INR, PROTIME in the last 168 hours. Cardiac Enzymes:  Recent Labs Lab 05/29/2016 1148 06/05/2016 1451 05/25/2016 1824  TROPONINI 0.65* 0.68* 0.73*     BNP (last 3 results) No results for input(s): PROBNP in the last 8760 hours. HbA1C: No results for input(s): HGBA1C in the last 72 hours. CBG: No results for input(s): GLUCAP in the last 168 hours. Lipid Profile: No results for input(s): CHOL, HDL, LDLCALC, TRIG, CHOLHDL, LDLDIRECT in the last 72 hours. Thyroid Function Tests: No results for input(s): TSH, T4TOTAL, FREET4, T3FREE, THYROIDAB in the last 72 hours. Anemia Panel: No results for input(s): VITAMINB12, FOLATE, FERRITIN, TIBC, IRON, RETICCTPCT in the last 72 hours. Sepsis Labs:  Recent Labs Lab 05/28/2016 1159 05/18/2016 1449  LATICACIDVEN 1.65 1.35    Recent Results (from the past 240 hour(s))  MRSA PCR Screening     Status: None   Collection Time: 05/17/2016  5:56 PM  Result Value Ref Range Status   MRSA by PCR NEGATIVE NEGATIVE Final    Comment:        The GeneXpert MRSA Assay (FDA approved for NASAL specimens only), is one component of a comprehensive MRSA colonization surveillance  program. It is not intended to diagnose MRSA infection nor to guide or monitor treatment for MRSA infections.          Radiology Studies: Ct Angio Chest Pe W And/or Wo Contrast  Result Date: 06/08/2016 CLINICAL DATA:  Shortness of breath and hypoxia EXAM: CT ANGIOGRAPHY CHEST WITH CONTRAST TECHNIQUE: Multidetector CT imaging of the chest was performed using the standard protocol during bolus administration of intravenous contrast. Multiplanar CT image reconstructions and MIPs were obtained to evaluate the vascular anatomy. CONTRAST:  82 mL Isovue 370. COMPARISON:  05/14/2016, 09/05/2015 FINDINGS: Cardiovascular: Diffuse atherosclerotic changes of the thoracic aorta and its branches are noted. No evidence of dissection is seen. Mild dilatation of the ascending aorta 4 cm is noted. The pulmonary artery demonstrates a normal branching pattern. No intraluminal filling defect is identified to suggest pulmonary embolism. Mild coronary  calcifications are noted. Mild cardiac enlargement is seen. No pericardial effusion is noted. Mediastinum/Nodes: The thoracic inlet is within normal limits. No significant hilar or mediastinal adenopathy is noted. Scattered small lymph nodes are noted in the prevascular region. Mildly calcified lymph node nodes are seen in the subcarinal region as well as within the hila bilaterally. These would be consistent with prior granulomatous disease. The esophagus as visualize is within normal limits. Lungs/Pleura: Bilateral pleural effusions are noted right greater than left. Diffuse emphysematous changes are seen throughout both lungs. Patchy infiltrative changes are noted in the bases bilaterally slightly greater on the right than the left. Some of these have a somewhat nodular appearance particularly in the right lower lobe on image number 36 of series 11. An 8 mm noncalcified nodule is noted in the left upper lobe along the fissure best seen on image number 17 of series 11. Scattered calcified granulomas are noted as well. Upper Abdomen: The visualized upper abdomen shows scattered calcified splenic granulomas. Musculoskeletal: Postsurgical changes are noted in the right breast consistent with the patient clinical history. Mottled appearance to the bony structures is noted diffusely but most notable within the thoracic spine. A healing fracture of the right fourth rib is noted posterolaterally. In expansile lesion is noted involving the central portion of the first rib adjacent to the thoracic spine. Some scattered lytic lesions are noted within the scapula on the left. Compression deformities of C7, T1, T10 and T11 are noted of uncertain age. Review of the MIP images confirms the above findings. IMPRESSION: Bilateral pleural effusions right greater than left as well as patchy infiltrative changes in the bases with some nodular changes seen. Although these may simply represent pneumonic infiltrates the possibility of  neoplastic involvement deserves consideration. The bony structures demonstrates significant lytic lesions most notable in the thoracic spine with a T11 and T10 compression deformity as well as mild height loss at C7 and T1. These changes are consistent with metastatic disease. Changes consistent with prior granulomatous disease. Electronically Signed   By: Inez Catalina M.D.   On: 05/24/2016 16:02   Dg Chest Port 1 View  Result Date: 05/28/2016 CLINICAL DATA:  61 year old former smoker presenting with acute onset of cough. Hypoxia is present. Personal history of right breast cancer. EXAM: PORTABLE CHEST 1 VIEW COMPARISON:  05/18/2016, 05/16/2016 and earlier. FINDINGS: Cardiac silhouette moderately enlarged. Mild-to-moderate diffuse interstitial pulmonary edema. Chronic persistent bilateral pleural effusions, moderate to large in size, unchanged. Associated dense consolidation in the lower lobes. Surgical clips in the right axilla from prior node dissection. IMPRESSION: 1. Acute mild-to-moderate CHF, with stable cardiomegaly and interstitial pulmonary edema.  2. Chronic persistent moderate to large bilateral pleural effusions, right greater than left, with associated chronic passive atelectasis in the lower lobes (favored over pneumonia). Electronically Signed   By: Evangeline Dakin M.D.   On: 06/05/2016 11:13        Scheduled Meds: . calcium carbonate  1 tablet Oral BID WC  . furosemide  40 mg Intravenous BID  . pantoprazole  40 mg Oral QAC breakfast  . predniSONE  60 mg Oral Q breakfast  . sodium chloride flush  3 mL Intravenous Q12H  . sodium chloride flush  3 mL Intravenous Q12H   Continuous Infusions: . sodium chloride    . heparin 900 Units/hr (Jun 18, 2016 0600)     LOS: 1 day        Charistopher Rumble Gerome Apley, MD [facility] Pager [phone]  If 7PM-7AM, please contact night-coverage [email or web address] Password [facility] 18-Jun-2016, 7:44 AM

## 2016-06-12 NOTE — Progress Notes (Signed)
Pt off BIPAP and on 15 LPM HFNC sats 92%.

## 2016-06-12 NOTE — Assessment & Plan Note (Signed)
  Interdisciplinary Goals of Care Family Meeting   Date carried out:: 06/26/2016  Location of the meeting: Conference room  Member's involved: Physician, Bedside Registered Nurse, Social Worker and Family Member or next of kin  [place] of Attorney or acting medical decision maker: daughter Tazaria Dlugosz    Discussion: We discussed goals of care for [employer] .  Daughter aware of poor prognosis. STrugglin to make mom comfortable so asking for full code  Code status: Full Code  Disposition: Continue current acute care  Time spent for the meeting: 15 min  Psalm Arman 2016-06-26, 7:00 PM

## 2016-06-12 NOTE — Progress Notes (Signed)
Progress Note  Patient Name: Danielle [name] Date of Encounter: 06/23/16  Primary Cardiologist: Dr Debara Pickett (new)  Subjective   SOB at rest this am  Inpatient Medications    Scheduled Meds: . calcium carbonate  1 tablet Oral BID WC  . furosemide  40 mg Intravenous BID  . pantoprazole  40 mg Oral QAC breakfast  . predniSONE  60 mg Oral Q breakfast  . sodium chloride flush  3 mL Intravenous Q12H  . sodium chloride flush  3 mL Intravenous Q12H   Continuous Infusions: . sodium chloride    . heparin 900 Units/hr (06/23/2016 0600)   PRN Meds: sodium chloride, acetaminophen **OR** acetaminophen, HYDROcodone-acetaminophen, LORazepam, methocarbamol, ondansetron **OR** ondansetron (ZOFRAN) IV, sodium chloride flush   Vital Signs    Vitals:   23-Jun-2016 0526 06-23-16 0600 06-23-2016 0730 06/23/16 0800  BP:  (!) 151/88  127/79  Pulse:  (!) 113  (!) 113  Resp:  (!) 22  (!) 26  Temp: 97.1 F (36.2 C)  97.9 F (36.6 C)   TempSrc: Axillary  Axillary   SpO2:    96%  Weight: 141 lb 8.6 oz (64.2 kg)     Height:        Intake/Output Summary (Last 24 hours) at 06-23-2016 0856 Last data filed at 06-23-16 0600  Gross per 24 hour  Intake           108.39 ml  Output                0 ml  Net           108.39 ml   Filed Weights   06/11/2016 0932 05/16/2016 1751 Jun 23, 2016 0526  Weight: 145 lb (65.8 kg) 139 lb 1.8 oz (63.1 kg) 141 lb 8.6 oz (64.2 kg)    Telemetry    NSR, ST - Personally Reviewed  ECG    NSR, ST, incomplete RBBB - Personally Reviewed  Physical Exam   GEN: She is struggling this am- SOB sitting up in bed Neck: No JVD Cardiac: RRR, no murmurs, rubs, or gallops.  Respiratory: balateral rales GI: Soft, nontender, non-distended  MS: No edema; No deformity. Neuro:  Nonfocal  Psych: Normal affect   Labs    Chemistry Recent Labs Lab 06/03/2016 06/05/2016 1148 Jun 23, 2016 0221  NA 139 138 138  K 3.9 3.4* 4.3  CL  --  103 106  CO2  --  22 20*  GLUCOSE  --  83 78  BUN 25*  25* 23*  CREATININE 0.4* 0.55 0.45  CALCIUM  --  8.8* 9.0  PROT  --  6.8  --   ALBUMIN  --  3.1*  --   AST  --  101*  --   ALT  --  92*  --   ALKPHOS  --  147*  --   BILITOT  --  1.2  --   GFRNONAA  --  >60 >60  GFRAA  --  >60 >60  ANIONGAP  --  13 12     Hematology Recent Labs Lab 05/17/2016 05/22/2016 1148 06/23/16 0221  WBC 11.3 8.6 9.3  RBC  --  3.61* 3.47*  HGB 10.5* 10.5* 10.5*  HCT 30* 31.9* 30.8*  MCV  --  88.4 88.8  MCH  --  29.1 30.3  MCHC  --  32.9 34.1  RDW  --  18.3* 18.8*  PLT 106* 91* 85*    Cardiac Enzymes Recent Labs Lab 05/13/2016 1148 05/31/2016 1451 06/04/2016 1824  TROPONINI 0.65* 0.68* 0.73*    Recent Labs Lab 05/16/2016 1157  TROPIPOC 0.73*     BNP Recent Labs Lab 06/01/2016 1148  BNP 618.0*     DDimer No results for input(s): DDIMER in the last 168 hours.   Radiology    Ct Angio Chest Pe W And/or Wo Contrast  Result Date: 06/08/2016 CLINICAL DATA:  Shortness of breath and hypoxia EXAM: CT ANGIOGRAPHY CHEST WITH CONTRAST TECHNIQUE: Multidetector CT imaging of the chest was performed using the standard protocol during bolus administration of intravenous contrast. Multiplanar CT image reconstructions and MIPs were obtained to evaluate the vascular anatomy. CONTRAST:  82 mL Isovue 370. COMPARISON:  05/25/2016, 09/05/2015 FINDINGS: Cardiovascular: Diffuse atherosclerotic changes of the thoracic aorta and its branches are noted. No evidence of dissection is seen. Mild dilatation of the ascending aorta 4 cm is noted. The pulmonary artery demonstrates a normal branching pattern. No intraluminal filling defect is identified to suggest pulmonary embolism. Mild coronary calcifications are noted. Mild cardiac enlargement is seen. No pericardial effusion is noted. Mediastinum/Nodes: The thoracic inlet is within normal limits. No significant hilar or mediastinal adenopathy is noted. Scattered small lymph nodes are noted in the prevascular region. Mildly calcified  lymph node nodes are seen in the subcarinal region as well as within the hila bilaterally. These would be consistent with prior granulomatous disease. The esophagus as visualize is within normal limits. Lungs/Pleura: Bilateral pleural effusions are noted right greater than left. Diffuse emphysematous changes are seen throughout both lungs. Patchy infiltrative changes are noted in the bases bilaterally slightly greater on the right than the left. Some of these have a somewhat nodular appearance particularly in the right lower lobe on image number 36 of series 11. An 8 mm noncalcified nodule is noted in the left upper lobe along the fissure best seen on image number 17 of series 11. Scattered calcified granulomas are noted as well. Upper Abdomen: The visualized upper abdomen shows scattered calcified splenic granulomas. Musculoskeletal: Postsurgical changes are noted in the right breast consistent with the patient clinical history. Mottled appearance to the bony structures is noted diffusely but most notable within the thoracic spine. A healing fracture of the right fourth rib is noted posterolaterally. In expansile lesion is noted involving the central portion of the first rib adjacent to the thoracic spine. Some scattered lytic lesions are noted within the scapula on the left. Compression deformities of C7, T1, T10 and T11 are noted of uncertain age. Review of the MIP images confirms the above findings. IMPRESSION: Bilateral pleural effusions right greater than left as well as patchy infiltrative changes in the bases with some nodular changes seen. Although these may simply represent pneumonic infiltrates the possibility of neoplastic involvement deserves consideration. The bony structures demonstrates significant lytic lesions most notable in the thoracic spine with a T11 and T10 compression deformity as well as mild height loss at C7 and T1. These changes are consistent with metastatic disease. Changes consistent  with prior granulomatous disease. Electronically Signed   By: Inez Catalina M.D.   On: 06/09/2016 16:02   Dg Chest Port 1 View  Result Date: Jun 29, 2016 CLINICAL DATA:  Shortness of breath. EXAM: PORTABLE CHEST 1 VIEW COMPARISON:  CT 05/26/2016.  Chest x-ray 05/17/2016, 05/18/2016. FINDINGS: Mediastinum and heart size stable. Progressive bilateral from interstitial infiltrates noted. Pneumonitis and/or interstitial edema could present in this fashion. These changes may be neoplastic. Persistent bilateral pleural effusions. Reference is made to prior CT report for discussion of bony lesions  suggesting metastatic disease. No pneumothorax. IMPRESSION: 1. Progressive bilateral pulmonary interstitial infiltrates. Pneumonitis and/or interstitial edema could present in this fashion. These changes could also be neoplastic. Persistent bilateral pleural effusions. 2. Reference is made to CT report for discussion of bony lytic lesions suggesting metastasis. Electronically Signed   By: Marcello Moores  Register   On: 06/13/2016 08:39   Dg Chest Port 1 View  Result Date: 05/24/2016 CLINICAL DATA:  61 year old former smoker presenting with acute onset of cough. Hypoxia is present. Personal history of right breast cancer. EXAM: PORTABLE CHEST 1 VIEW COMPARISON:  05/18/2016, 05/16/2016 and earlier. FINDINGS: Cardiac silhouette moderately enlarged. Mild-to-moderate diffuse interstitial pulmonary edema. Chronic persistent bilateral pleural effusions, moderate to large in size, unchanged. Associated dense consolidation in the lower lobes. Surgical clips in the right axilla from prior node dissection. IMPRESSION: 1. Acute mild-to-moderate CHF, with stable cardiomegaly and interstitial pulmonary edema. 2. Chronic persistent moderate to large bilateral pleural effusions, right greater than left, with associated chronic passive atelectasis in the lower lobes (favored over pneumonia). Electronically Signed   By: Evangeline Dakin M.D.   On:  05/13/2016 11:13    Cardiac Studies   Echo pending  Patient Profile     61 y.o. female who was admitted 05/16/2016 with dyspnea. She has a history of myositis and has been followed at [facility] on long-term steroids as well as a history of breast CA in 2013. She was recently admitted with CAP 3 weeks ago and had associated renal insufficiency. She was discharged on oxygen to SNF. She returns with worsening dyspnea and CXR findings of CHF. She reports polymyalgias as well as chest pressure. Her troponin was elevated but in the setting of a very high CK of 4000. BNP elevated at 618. Suspect this is not ACS. She is now in CHF  Assessment & Plan    1. Acute on chronic respiratory failure- CXR and BNP suggest acute CHF  2. Troponin level elevated- doubt acute coronary syndrome. This could be from Myositis  3. 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 Her CK is 4k  4. H/O breast cancer-surgery and chemotherapy in 2013, LVF normal by echo then. CT scan this admission suggests metastasis to spine.   5. Thrombocytopenia- Plts down to 85k, watch closely especially since the pt was placed on Heparin (not sure she needs this as her Troponin elevation is probably not acute ischemia).   Plan: She is in CHF this am. She has received IV Lasix this morning, she declines Bi pap. Per RN she does not know about CT findings. Not sure she needs Heparin since we don't think she has ACS and her Plts are dropping.   Angelena Form, PA-C  2016/06/13, 8:56 AM

## 2016-06-12 DEATH — deceased

## 2016-06-13 ENCOUNTER — Ambulatory Visit (HOSPITAL_COMMUNITY): Payer: BLUE CROSS/BLUE SHIELD

## 2016-06-13 ENCOUNTER — Telehealth (HOSPITAL_COMMUNITY): Payer: Self-pay | Admitting: Adult Health

## 2016-06-13 ENCOUNTER — Other Ambulatory Visit (HOSPITAL_COMMUNITY): Payer: Self-pay | Admitting: *Deleted

## 2016-06-13 NOTE — Telephone Encounter (Signed)
(  This is a late entry telephone encounter from 06/01/16 around 5:30pm)   I spoke with Dr. Alethia Berthold at [facility] re: the update we received from the patient's daughter. She was being admitted/getting settled at [name] in [place].  [name] is willing to bring her to [facility] for evaluation/treatment, if the nursing home is unable to provide transportation to [place].  I shared this update with Dr. Alethia Berthold. He expressed appreciation for the return call. I let him know that we are working diligently to get Danielle [name] in for evaluation and treatment as soon as possible.  Encouraged him to call us with any further questions or concerns.    Mike Craze, NP [place] [phone]

## 2018-06-16 IMAGING — CR DG CHEST 2V
2 series · 2 of 2 positions shown · non-contrast
Comparison: 05/16/2016.

CLINICAL DATA: Pneumonia.

EXAM:
CHEST  2 VIEW

[w chest pa]
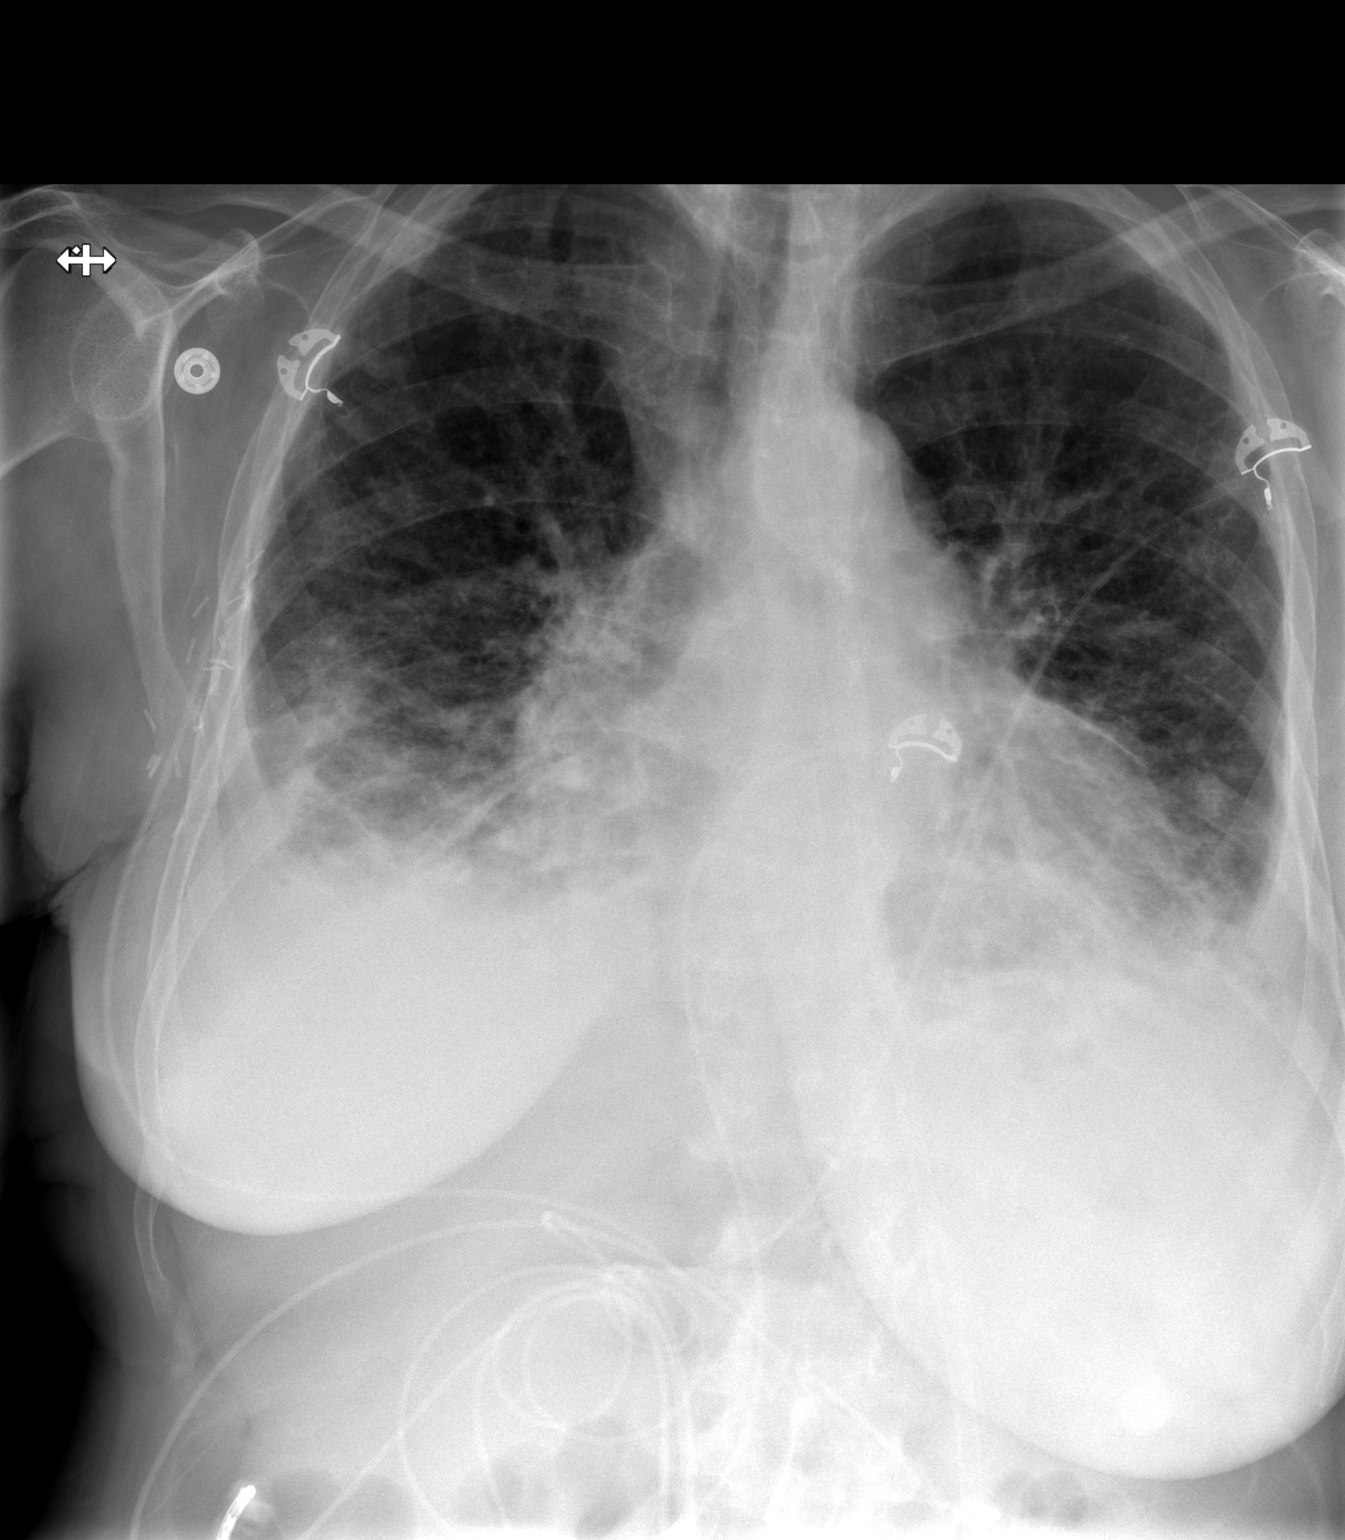

[w chest lat]
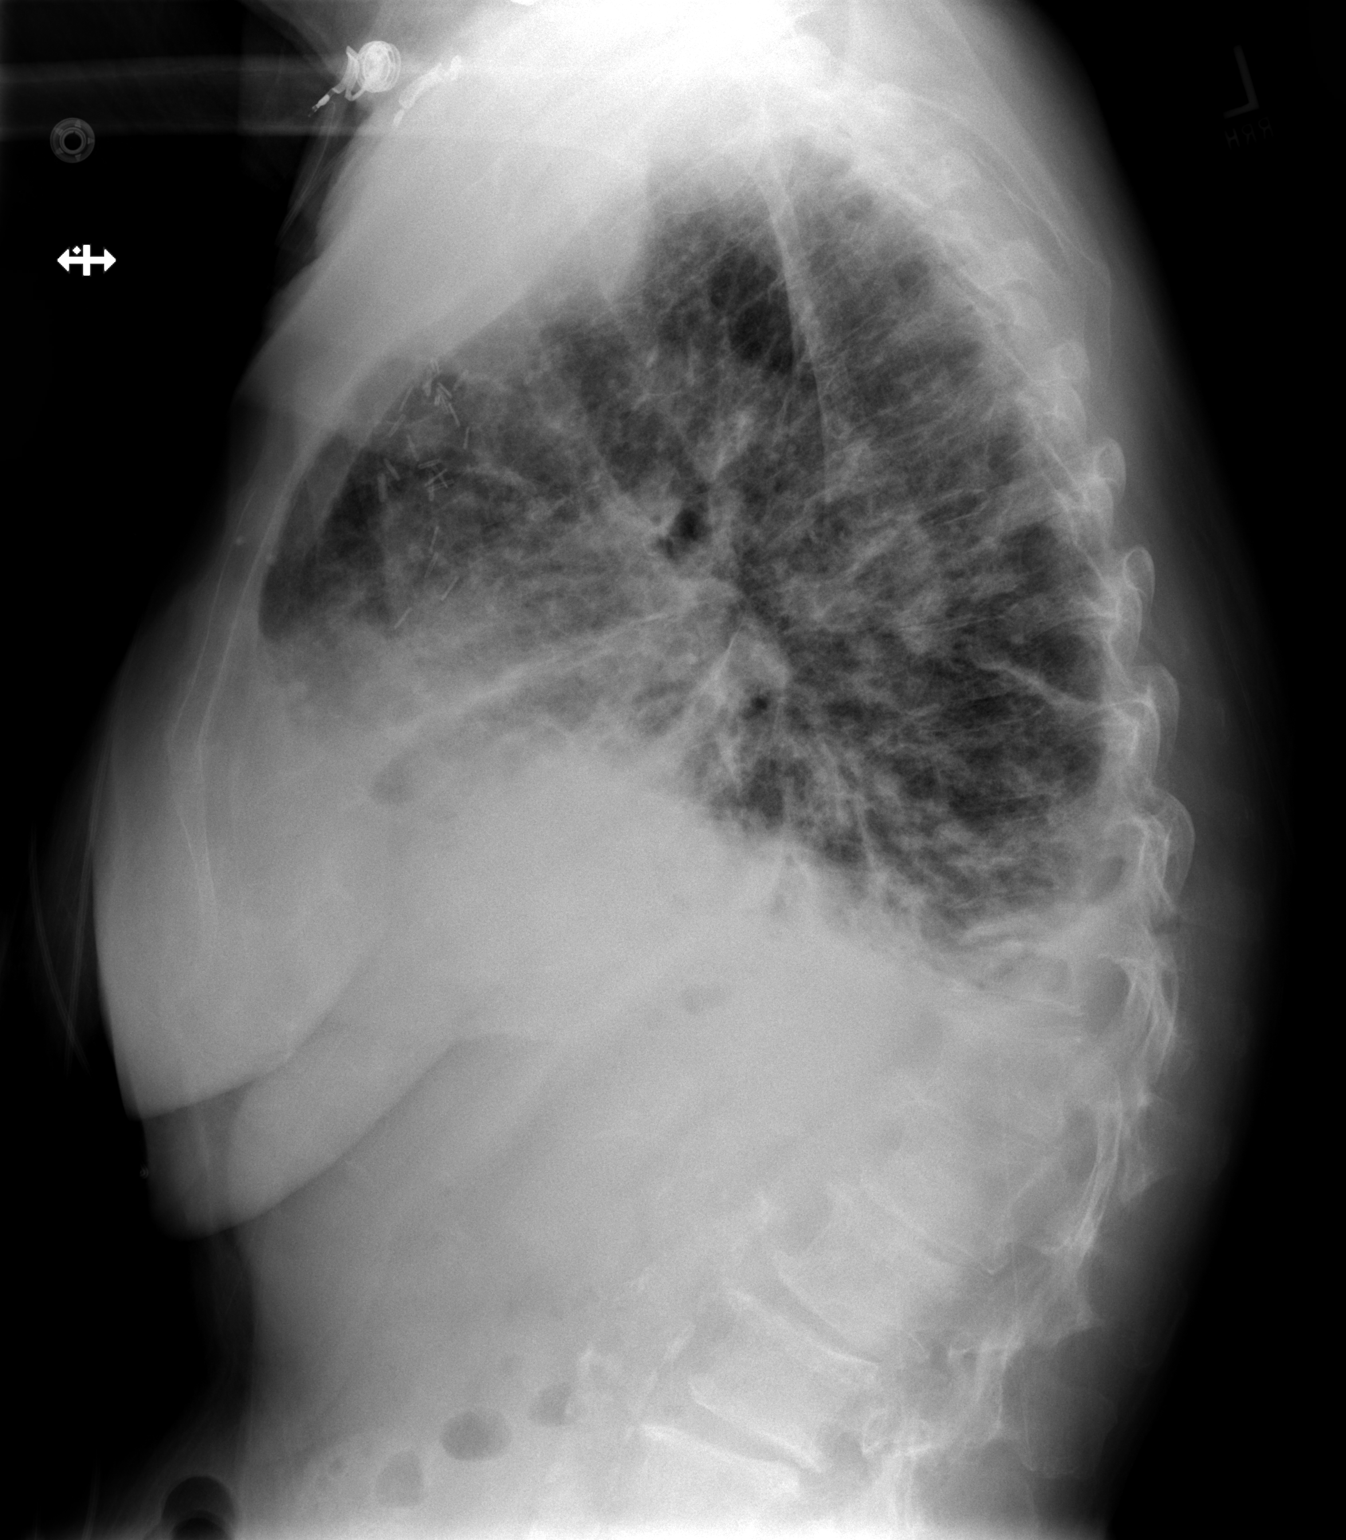

[2 of 2 positions shown; findings below may reference images not displayed]

FINDINGS: BILATERAL pulmonary opacities are improved. Increasing lung volumes.
Cardiomegaly. Less edema.
IMPRESSION: Improved aeration could represent clearing pneumonia or decreasing
CHF.

## 2018-09-27 IMAGING — US US ABDOMEN LIMITED
1 series · 14 of 25 positions shown · non-contrast
Comparison: CT scan abdomen and pelvis 09/05/2015

CLINICAL DATA: Abnormal LFTs

EXAM:
US ABDOMEN LIMITED - RIGHT UPPER QUADRANT

[Series 1: us abdomen limited · 0.18mm/px · 14 of 64 slices shown]
[im 1/64]
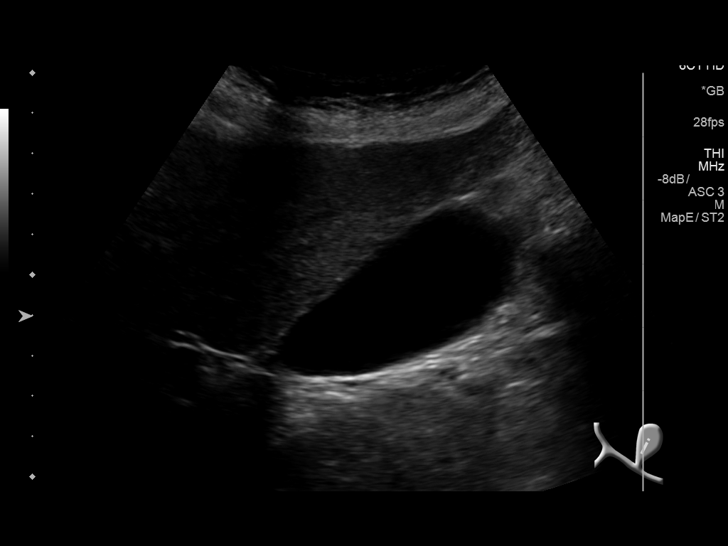
[im 6/64]
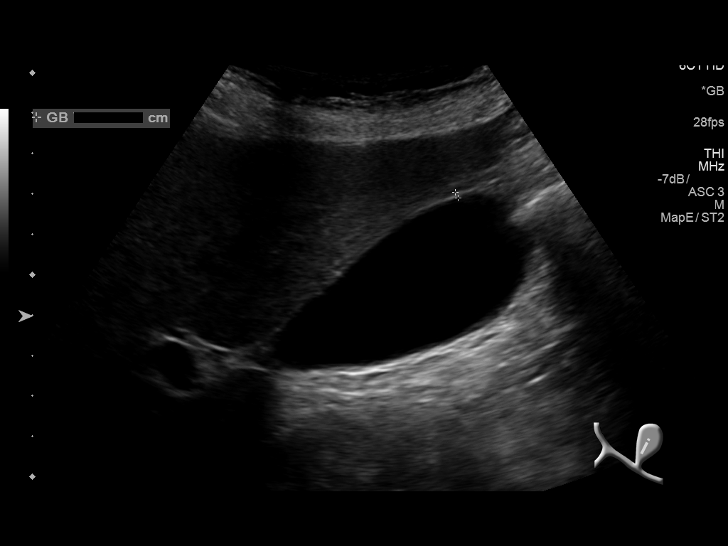
[im 11/64]
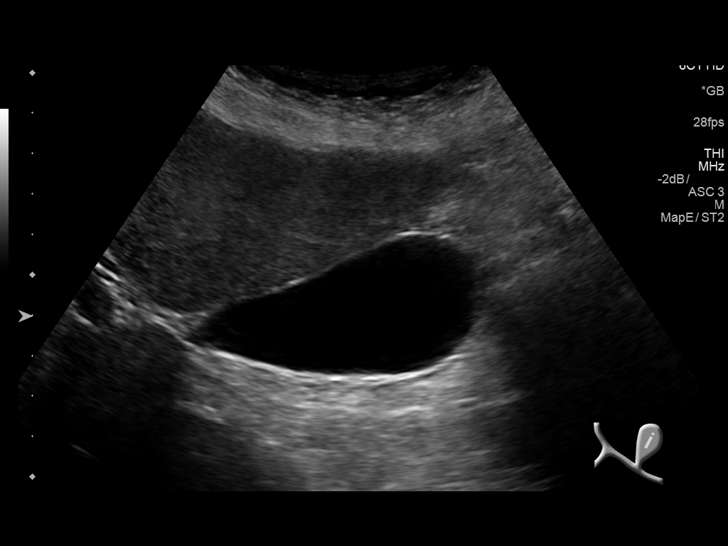
[im 16/64]
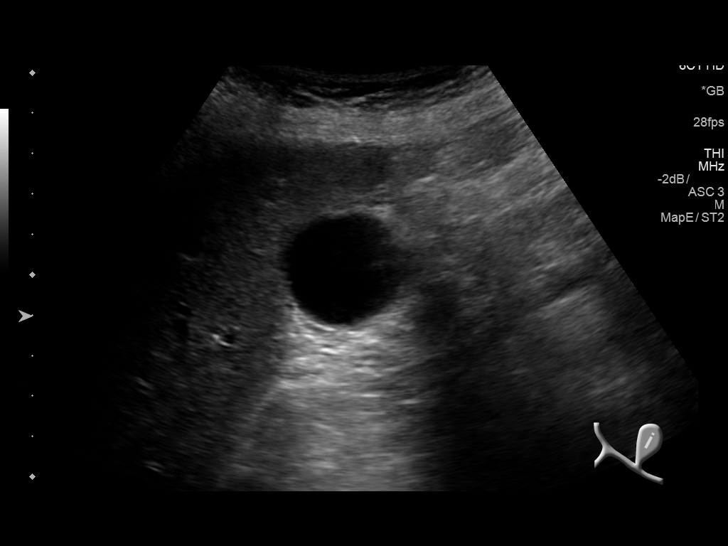
[im 22/64]
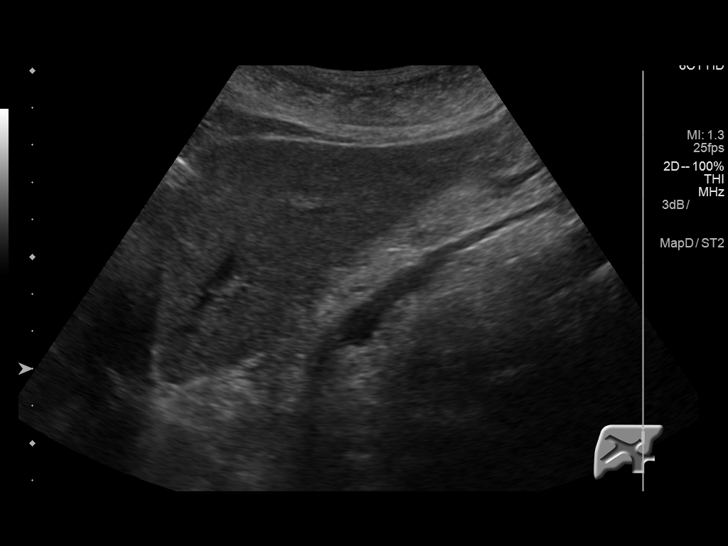
[im 24/64]
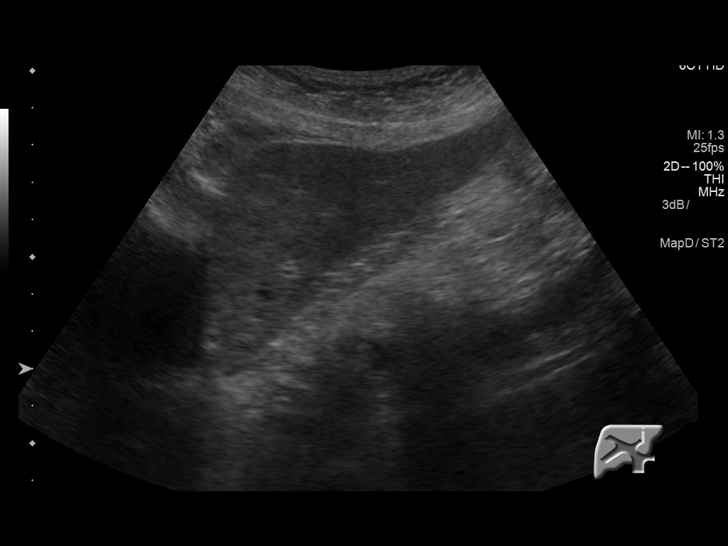
[im 29/64]
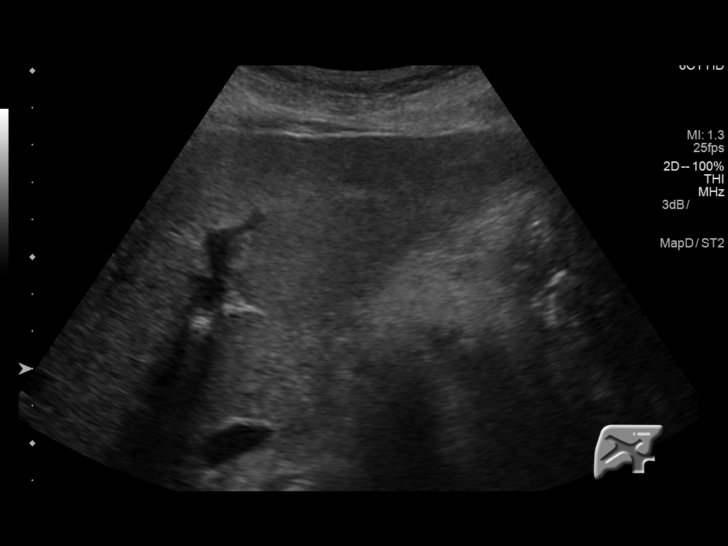
[im 35/64]
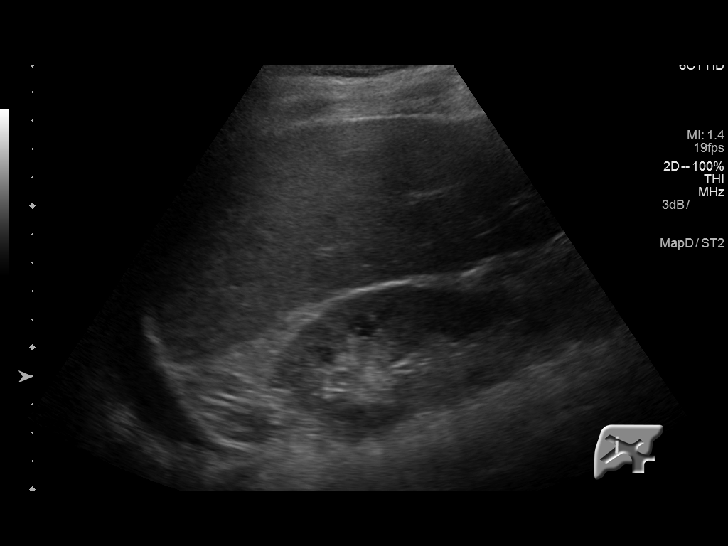
[im 40/64]
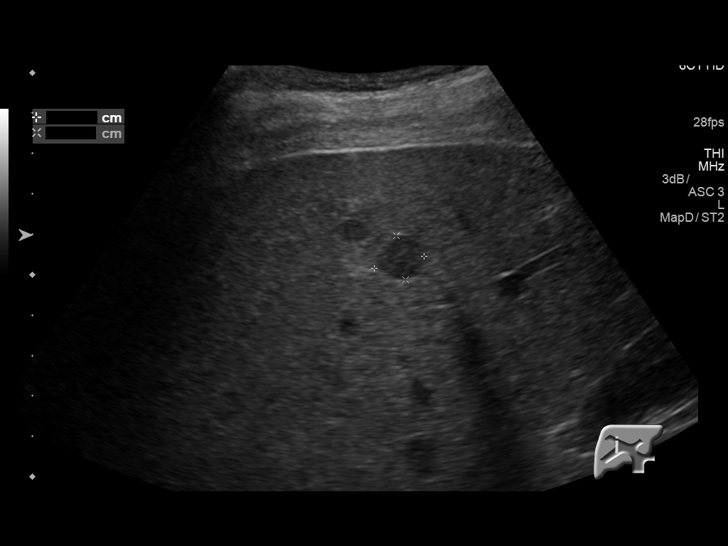
[im 43/64]
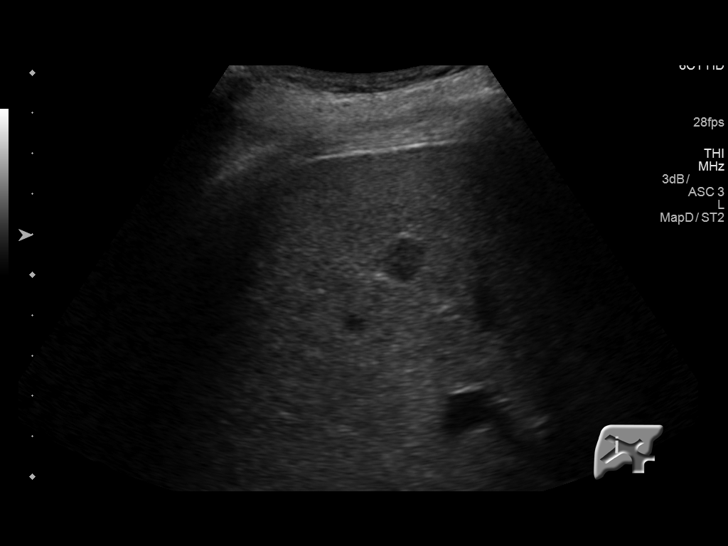
[im 48/64]
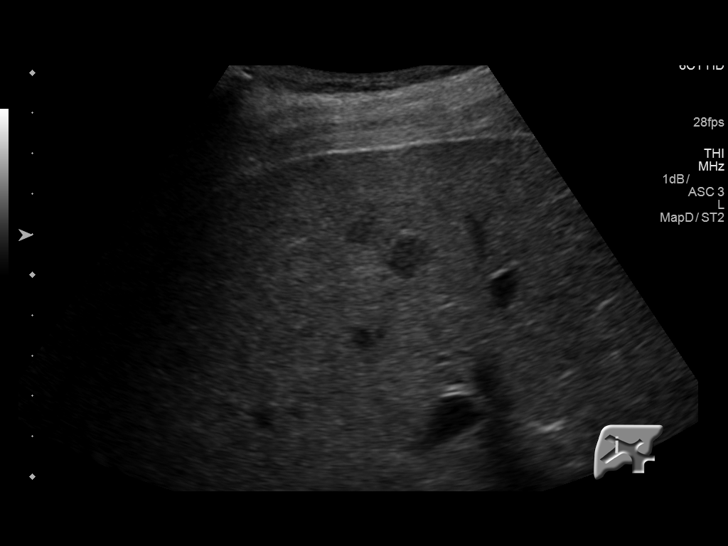
[im 53/64]
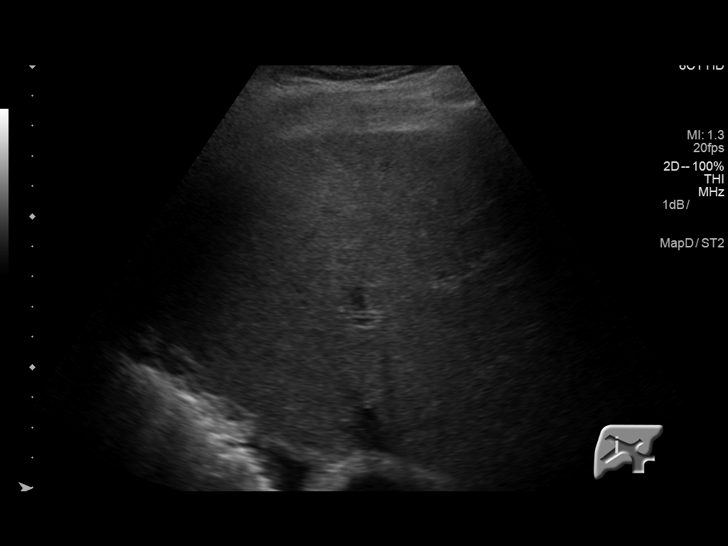
[im 58/64]
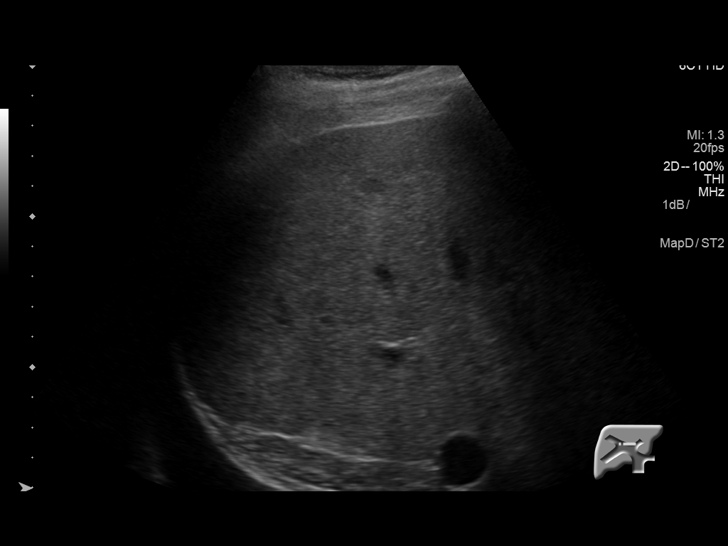
[im 64/64]
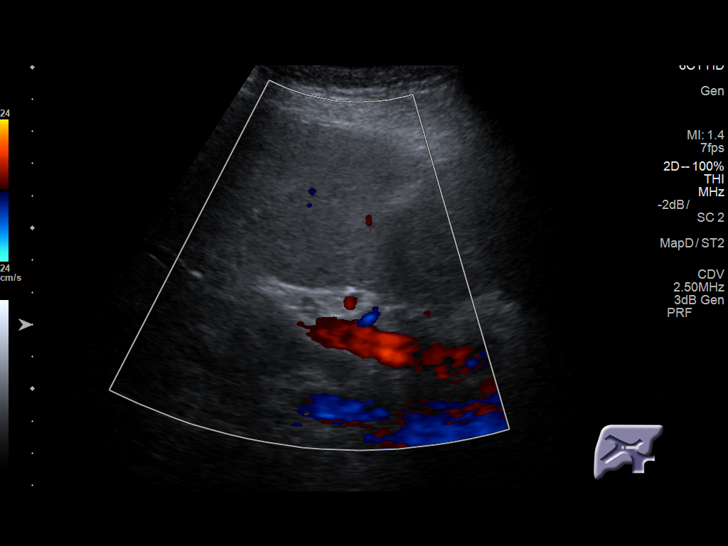

[14 of 25 positions shown; findings below may reference images not displayed]

FINDINGS: Gallbladder:

No gallstones or wall thickening visualized. No sonographic Murphy
sign noted by sonographer.

Common bile duct:

Diameter: 2.4 mm in diameter within normal limits.

Liver:

There is hypoechoic lesion in right hepatic lobe measures 1.3 x
cm. Second hypoechoic lesion in right hepatic lobe measures 8 x 8
mm. Further correlation with enhanced CT or MRI is recommended to
exclude solids lesions.
IMPRESSION: 1. No gallstones are noted within gallbladder.  Normal CBD.
2. At least 2 hypoechoic lesions are noted within right hepatic
lobe. Further correlation with enhanced CT or MRI is recommended to
exclude solid lesions.
